# Patient Record
Sex: Female | Born: 1940 | Race: White | Hispanic: No | Smoking: Never smoker
Health system: Southern US, Community
[De-identification: ages and names within clinical notes are randomized; demographics above are authoritative.]

## PROBLEM LIST (undated history)

## (undated) DIAGNOSIS — K219 Gastro-esophageal reflux disease without esophagitis: Secondary | ICD-10-CM

## (undated) DIAGNOSIS — H409 Unspecified glaucoma: Secondary | ICD-10-CM

## (undated) DIAGNOSIS — M542 Cervicalgia: Secondary | ICD-10-CM

## (undated) DIAGNOSIS — R112 Nausea with vomiting, unspecified: Secondary | ICD-10-CM

## (undated) DIAGNOSIS — G43909 Migraine, unspecified, not intractable, without status migrainosus: Secondary | ICD-10-CM

## (undated) DIAGNOSIS — M549 Dorsalgia, unspecified: Secondary | ICD-10-CM

## (undated) DIAGNOSIS — M719 Bursopathy, unspecified: Secondary | ICD-10-CM

## (undated) DIAGNOSIS — T7840XA Allergy, unspecified, initial encounter: Secondary | ICD-10-CM

## (undated) DIAGNOSIS — J302 Other seasonal allergic rhinitis: Secondary | ICD-10-CM

## (undated) DIAGNOSIS — E756 Lipid storage disorder, unspecified: Secondary | ICD-10-CM

## (undated) DIAGNOSIS — R0600 Dyspnea, unspecified: Secondary | ICD-10-CM

## (undated) DIAGNOSIS — M1990 Unspecified osteoarthritis, unspecified site: Secondary | ICD-10-CM

## (undated) DIAGNOSIS — IMO0002 Reserved for concepts with insufficient information to code with codable children: Secondary | ICD-10-CM

## (undated) DIAGNOSIS — H269 Unspecified cataract: Secondary | ICD-10-CM

## (undated) DIAGNOSIS — E8809 Other disorders of plasma-protein metabolism, not elsewhere classified: Secondary | ICD-10-CM

## (undated) DIAGNOSIS — Z9889 Other specified postprocedural states: Secondary | ICD-10-CM

## (undated) DIAGNOSIS — IMO0001 Reserved for inherently not codable concepts without codable children: Secondary | ICD-10-CM

## (undated) DIAGNOSIS — I639 Cerebral infarction, unspecified: Secondary | ICD-10-CM

## (undated) DIAGNOSIS — I10 Essential (primary) hypertension: Secondary | ICD-10-CM

## (undated) HISTORY — PX: ABDOMINAL HYSTERECTOMY: SHX81

## (undated) HISTORY — PX: EYE SURGERY: SHX253

## (undated) HISTORY — DX: Unspecified cataract: H26.9

## (undated) HISTORY — PX: FOOT SURGERY: SHX648

## (undated) HISTORY — DX: Reserved for concepts with insufficient information to code with codable children: IMO0002

## (undated) HISTORY — PX: CARPAL TUNNEL RELEASE: SHX101

## (undated) HISTORY — DX: Bursopathy, unspecified: M71.9

## (undated) HISTORY — DX: Dorsalgia, unspecified: M54.9

## (undated) HISTORY — DX: Reserved for inherently not codable concepts without codable children: IMO0001

## (undated) HISTORY — DX: Migraine, unspecified, not intractable, without status migrainosus: G43.909

## (undated) HISTORY — DX: Cervicalgia: M54.2

## (undated) HISTORY — DX: Cerebral infarction, unspecified: I63.9

## (undated) HISTORY — PX: CERVICAL FUSION: SHX112

## (undated) HISTORY — DX: Allergy, unspecified, initial encounter: T78.40XA

## (undated) HISTORY — PX: SPINE SURGERY: SHX786

## (undated) HISTORY — DX: Essential (primary) hypertension: I10

## (undated) HISTORY — DX: Gastro-esophageal reflux disease without esophagitis: K21.9

---

## 2000-08-10 ENCOUNTER — Other Ambulatory Visit: Admission: RE | Admit: 2000-08-10 | Discharge: 2000-08-10 | Payer: Self-pay | Admitting: Internal Medicine

## 2001-01-11 ENCOUNTER — Encounter: Payer: Self-pay | Admitting: Family Medicine

## 2001-01-11 ENCOUNTER — Ambulatory Visit (HOSPITAL_COMMUNITY): Admission: RE | Admit: 2001-01-11 | Discharge: 2001-01-11 | Payer: Self-pay | Admitting: Family Medicine

## 2001-08-17 ENCOUNTER — Encounter: Payer: Self-pay | Admitting: Family Medicine

## 2001-08-17 ENCOUNTER — Ambulatory Visit (HOSPITAL_COMMUNITY): Admission: RE | Admit: 2001-08-17 | Discharge: 2001-08-17 | Payer: Self-pay | Admitting: Family Medicine

## 2002-03-08 ENCOUNTER — Ambulatory Visit (HOSPITAL_COMMUNITY): Admission: RE | Admit: 2002-03-08 | Discharge: 2002-03-08 | Payer: Self-pay | Admitting: Unknown Physician Specialty

## 2002-03-08 ENCOUNTER — Encounter: Payer: Self-pay | Admitting: Unknown Physician Specialty

## 2002-04-21 ENCOUNTER — Encounter: Payer: Self-pay | Admitting: Neurosurgery

## 2002-04-25 ENCOUNTER — Inpatient Hospital Stay (HOSPITAL_COMMUNITY): Admission: RE | Admit: 2002-04-25 | Discharge: 2002-04-27 | Payer: Self-pay | Admitting: Neurosurgery

## 2002-04-25 ENCOUNTER — Encounter: Payer: Self-pay | Admitting: Neurosurgery

## 2002-09-21 ENCOUNTER — Encounter (HOSPITAL_COMMUNITY): Admission: RE | Admit: 2002-09-21 | Discharge: 2002-10-21 | Payer: Self-pay | Admitting: Neurosurgery

## 2002-10-02 ENCOUNTER — Ambulatory Visit: Admission: RE | Admit: 2002-10-02 | Discharge: 2002-10-02 | Payer: Self-pay | Admitting: Orthopedic Surgery

## 2002-10-02 ENCOUNTER — Encounter: Payer: Self-pay | Admitting: Orthopedic Surgery

## 2002-10-05 ENCOUNTER — Encounter: Payer: Self-pay | Admitting: Orthopedic Surgery

## 2002-10-05 ENCOUNTER — Ambulatory Visit (HOSPITAL_COMMUNITY): Admission: RE | Admit: 2002-10-05 | Discharge: 2002-10-05 | Payer: Self-pay | Admitting: Orthopedic Surgery

## 2002-10-24 ENCOUNTER — Encounter (HOSPITAL_COMMUNITY): Admission: RE | Admit: 2002-10-24 | Discharge: 2002-11-23 | Payer: Self-pay | Admitting: Neurosurgery

## 2003-02-12 ENCOUNTER — Ambulatory Visit (HOSPITAL_COMMUNITY): Admission: RE | Admit: 2003-02-12 | Discharge: 2003-02-12 | Payer: Self-pay | Admitting: Neurosurgery

## 2004-09-02 ENCOUNTER — Ambulatory Visit (HOSPITAL_COMMUNITY): Admission: RE | Admit: 2004-09-02 | Discharge: 2004-09-02 | Payer: Self-pay | Admitting: Family Medicine

## 2004-09-15 ENCOUNTER — Ambulatory Visit: Payer: Self-pay | Admitting: Orthopedic Surgery

## 2004-09-16 ENCOUNTER — Encounter (HOSPITAL_COMMUNITY): Admission: RE | Admit: 2004-09-16 | Discharge: 2004-10-16 | Payer: Self-pay | Admitting: Orthopedic Surgery

## 2004-10-13 ENCOUNTER — Ambulatory Visit: Payer: Self-pay | Admitting: Orthopedic Surgery

## 2004-10-21 ENCOUNTER — Encounter (HOSPITAL_COMMUNITY): Admission: RE | Admit: 2004-10-21 | Discharge: 2004-11-20 | Payer: Self-pay | Admitting: Orthopedic Surgery

## 2004-11-03 ENCOUNTER — Encounter: Admission: RE | Admit: 2004-11-03 | Discharge: 2004-11-03 | Payer: Self-pay | Admitting: Orthopedic Surgery

## 2004-11-20 ENCOUNTER — Encounter: Admission: RE | Admit: 2004-11-20 | Discharge: 2004-11-20 | Payer: Self-pay | Admitting: Orthopedic Surgery

## 2004-12-11 ENCOUNTER — Encounter: Admission: RE | Admit: 2004-12-11 | Discharge: 2004-12-11 | Payer: Self-pay | Admitting: Orthopedic Surgery

## 2005-01-08 ENCOUNTER — Ambulatory Visit: Payer: Self-pay | Admitting: Orthopedic Surgery

## 2005-09-21 ENCOUNTER — Ambulatory Visit: Payer: Self-pay | Admitting: Orthopedic Surgery

## 2005-10-06 ENCOUNTER — Encounter: Admission: RE | Admit: 2005-10-06 | Discharge: 2005-10-06 | Payer: Self-pay | Admitting: Orthopedic Surgery

## 2005-10-29 ENCOUNTER — Encounter: Admission: RE | Admit: 2005-10-29 | Discharge: 2005-10-29 | Payer: Self-pay | Admitting: Orthopedic Surgery

## 2006-01-27 ENCOUNTER — Encounter: Admission: RE | Admit: 2006-01-27 | Discharge: 2006-01-27 | Payer: Self-pay | Admitting: Orthopedic Surgery

## 2006-04-19 ENCOUNTER — Ambulatory Visit: Payer: Self-pay | Admitting: Orthopedic Surgery

## 2006-04-30 ENCOUNTER — Encounter: Admission: RE | Admit: 2006-04-30 | Discharge: 2006-04-30 | Payer: Self-pay | Admitting: Orthopedic Surgery

## 2006-07-08 ENCOUNTER — Ambulatory Visit (HOSPITAL_COMMUNITY): Admission: RE | Admit: 2006-07-08 | Discharge: 2006-07-08 | Payer: Self-pay | Admitting: Neurosurgery

## 2006-09-22 ENCOUNTER — Encounter: Admission: RE | Admit: 2006-09-22 | Discharge: 2006-09-22 | Payer: Self-pay | Admitting: Orthopedic Surgery

## 2006-12-23 ENCOUNTER — Encounter: Admission: RE | Admit: 2006-12-23 | Discharge: 2006-12-23 | Payer: Self-pay | Admitting: Neurosurgery

## 2006-12-27 ENCOUNTER — Ambulatory Visit (HOSPITAL_COMMUNITY): Admission: RE | Admit: 2006-12-27 | Discharge: 2006-12-27 | Payer: Self-pay | Admitting: Family Medicine

## 2007-05-11 ENCOUNTER — Ambulatory Visit (HOSPITAL_COMMUNITY): Admission: RE | Admit: 2007-05-11 | Discharge: 2007-05-11 | Payer: Self-pay | Admitting: Family Medicine

## 2007-07-28 ENCOUNTER — Encounter: Admission: RE | Admit: 2007-07-28 | Discharge: 2007-07-28 | Payer: Self-pay | Admitting: Neurosurgery

## 2007-08-16 ENCOUNTER — Encounter: Admission: RE | Admit: 2007-08-16 | Discharge: 2007-08-16 | Payer: Self-pay | Admitting: Neurosurgery

## 2007-10-06 ENCOUNTER — Encounter (HOSPITAL_COMMUNITY): Admission: RE | Admit: 2007-10-06 | Discharge: 2007-11-05 | Payer: Self-pay | Admitting: Neurosurgery

## 2007-11-08 ENCOUNTER — Encounter (HOSPITAL_COMMUNITY): Admission: RE | Admit: 2007-11-08 | Discharge: 2007-12-08 | Payer: Self-pay | Admitting: Neurosurgery

## 2007-12-19 ENCOUNTER — Ambulatory Visit: Payer: Self-pay | Admitting: Internal Medicine

## 2008-03-05 ENCOUNTER — Ambulatory Visit (HOSPITAL_COMMUNITY): Admission: RE | Admit: 2008-03-05 | Discharge: 2008-03-05 | Payer: Self-pay | Admitting: Orthopedic Surgery

## 2008-03-05 ENCOUNTER — Ambulatory Visit: Payer: Self-pay | Admitting: Orthopedic Surgery

## 2008-03-05 DIAGNOSIS — M19049 Primary osteoarthritis, unspecified hand: Secondary | ICD-10-CM | POA: Insufficient documentation

## 2008-03-05 DIAGNOSIS — M76899 Other specified enthesopathies of unspecified lower limb, excluding foot: Secondary | ICD-10-CM | POA: Insufficient documentation

## 2008-06-28 ENCOUNTER — Ambulatory Visit (HOSPITAL_COMMUNITY): Admission: RE | Admit: 2008-06-28 | Discharge: 2008-06-28 | Payer: Self-pay | Admitting: Family Medicine

## 2009-06-27 ENCOUNTER — Ambulatory Visit (HOSPITAL_COMMUNITY): Admission: RE | Admit: 2009-06-27 | Discharge: 2009-06-27 | Payer: Self-pay | Admitting: Family Medicine

## 2009-06-28 ENCOUNTER — Ambulatory Visit (HOSPITAL_COMMUNITY): Admission: RE | Admit: 2009-06-28 | Discharge: 2009-06-28 | Payer: Self-pay | Admitting: Family Medicine

## 2010-02-06 ENCOUNTER — Ambulatory Visit: Payer: Self-pay | Admitting: Orthopedic Surgery

## 2010-02-06 DIAGNOSIS — M5126 Other intervertebral disc displacement, lumbar region: Secondary | ICD-10-CM | POA: Insufficient documentation

## 2010-02-06 DIAGNOSIS — M4800 Spinal stenosis, site unspecified: Secondary | ICD-10-CM | POA: Insufficient documentation

## 2010-02-06 DIAGNOSIS — Q762 Congenital spondylolisthesis: Secondary | ICD-10-CM | POA: Insufficient documentation

## 2010-02-07 ENCOUNTER — Encounter: Payer: Self-pay | Admitting: Orthopedic Surgery

## 2010-02-11 ENCOUNTER — Ambulatory Visit (HOSPITAL_COMMUNITY): Admission: RE | Admit: 2010-02-11 | Discharge: 2010-02-11 | Payer: Self-pay | Admitting: Orthopedic Surgery

## 2010-02-12 ENCOUNTER — Encounter: Payer: Self-pay | Admitting: Orthopedic Surgery

## 2010-02-17 ENCOUNTER — Telehealth: Payer: Self-pay | Admitting: Orthopedic Surgery

## 2010-02-18 ENCOUNTER — Encounter (HOSPITAL_COMMUNITY)
Admission: RE | Admit: 2010-02-18 | Discharge: 2010-03-20 | Payer: Self-pay | Source: Home / Self Care | Attending: Orthopedic Surgery | Admitting: Orthopedic Surgery

## 2010-02-26 ENCOUNTER — Encounter: Payer: Self-pay | Admitting: Orthopedic Surgery

## 2010-03-20 ENCOUNTER — Ambulatory Visit (HOSPITAL_COMMUNITY)
Admission: RE | Admit: 2010-03-20 | Discharge: 2010-03-20 | Payer: Self-pay | Source: Home / Self Care | Attending: Family Medicine | Admitting: Family Medicine

## 2010-03-27 ENCOUNTER — Encounter: Payer: Self-pay | Admitting: Orthopedic Surgery

## 2010-04-13 ENCOUNTER — Encounter: Payer: Self-pay | Admitting: Orthopedic Surgery

## 2010-04-24 NOTE — Assessment & Plan Note (Signed)
Summary: RT KNEE PAIN NEEDS XR/BLUE MCR/BSF   Visit Type:  Follow-up  CC:  right knee pain.  History of Present Illness: Kerry Richardson presents with a new problem complaint of RIGHT knee pain.  In the past. She was seen for bursitis and received an injection and was treated with Voltaren gel successfully.  She was also worked up for lumbar disc problem and was treated with epidural steroid injections in 2008. She had an MRI at that time.  She is now complaining of pain in her RIGHT thigh, which radiates into her shin, associated with numbness and tingling of the leg. No back pain.  Treatment so far 800 mg ibuprofen as needed with some relief. She also takes vitamin D 3. X-rays of the RIGHT knee were done in December of 2009 and they were absolutely normal  DR. Roy follows her for back pain; saw him last month/ sees him every 3 months      Allergies: 1)  ! Codeine  Past History:  Past Medical History: Last updated: 03/02/2008 migraines skin cancer in past seasonal allergies  Past Surgical History: Last updated: 03/02/2008 hysterectomy bilateral CTR cervical fusion 2004 harrison excision mass rt long finger  Review of Systems Neurologic:  Complains of numbness and tingling. Musculoskeletal:  Complains of joint pain and muscle pain; denies swelling, instability, and stiffness.  Physical Exam  Skin:  intact without lesions or rashes Inguinal Nodes:  no significant adenopathy Psych:  alert and cooperative; normal mood and affect; normal attention span and concentration   Detailed Back/Spine Exam  General:    Well-developed, well-nourished, in no acute distress; alert and oriented x 3.    Gait:    Normal heel-toe gait pattern bilaterally.    Skin:    Intact with no erythema; no scarring.    Vascular:    dorsalis pedis and posterior tibial pulses 2+ and symmetric, capillary refill < 2 seconds, normal hair pattern, no evidence of ischemia.   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 Patient Level IV (78469) Lumbosacral Spine ,2/3 views (72100)  Problem # 3:  SPINAL STENOSIS (ICD-724.00) Assessment: Deteriorated  Orders: Est. Patient Level IV (62952) Lumbosacral Spine ,2/3 views (72100)  we have an MRI from April of 2008 and at that time. She had spondylosis bulging disc, asymmetric L4, L5, central and LEFT lateral recess stenosis, possible LEFT L5 root impingement, as well as advanced facet disease at L5, S1 with a grade 1 anterolisthesis mild bilateral  foraminal stenosis at other levels of bulging disc disease.  She also had a RIGHT knee x-ray which showed no arthritis.  Patient Instructions: 1)  MRI l spine  2)  spinal stenosis and spondylolisthesis  3)  I'll forward the report to Dr Channing Mutters and let him know we think the symptoms are from your back    Orders Added: 1)  Est. Patient Level IV [16109] 2)  Lumbosacral Spine ,2/3 views [72100]

## 2010-04-24 NOTE — Miscellaneous (Signed)
Summary: Dr. Channing Mutters Consult already a pt of his  Clinical Lists Changes  Orders: Added new Referral order of Neurosurgeon Referral (Neurosurgeon) - Signed Added new Referral order of Physical Therapy Referral (PT) - Signed

## 2010-04-24 NOTE — Progress Notes (Signed)
Summary: Referral to Dr. Channing Mutters.  Phone Note Outgoing Call   Call placed by: Waldon Reining,  February 17, 2010 1:13 PM Call placed to: Specialist Action Taken: Information Sent Summary of Call: I faxed a referral for this patient to Dr. Channing Mutters for back pain. She is already a patient there.

## 2010-04-24 NOTE — Miscellaneous (Signed)
Summary: Clinical Eval PT  Clinical Eval PT   Imported By: Eugenio Hoes 02/26/2010 15:41:36  _____________________________________________________________________  External Attachment:    Type:   Image     Comment:   External Document

## 2010-04-24 NOTE — Miscellaneous (Signed)
Summary: mri tues 02/11/10 reg 1230 [facility] DR TO CALL WITH RESULTS  Clinical Lists Changes   no precert needed for medicare/bcbs, Dr H to call with results, pt aware of appt, MRI L SPINE   Forward copy of MRI to Dr. Channing Mutters Neurosurgeon.

## 2010-04-24 NOTE — Miscellaneous (Signed)
Summary: PT progress note  PT progress note   Imported By: Jacklynn Ganong 03/27/2010 16:25:34  _____________________________________________________________________  External Attachment:    Type:   Image     Comment:   External Document

## 2010-04-24 NOTE — Miscellaneous (Signed)
  relayed results   plan   PT and dr Channing Mutters consult  Clinical Lists Changes

## 2010-08-08 NOTE — H&P (Signed)
NAME:  Kerry Richardson, Kerry Richardson                            ACCOUNT NO.:  [id number]   MEDICAL RECORD NO.:  [id number]                   PATIENT TYPE:  REC   LOCATION:  [facility]                                  FACILITY:  [facility]   PHYSICIAN:  Vickki Hearing, M.D.           DATE OF BIRTH:  06-23-1940   DATE OF ADMISSION:  09/21/2002  DATE OF DISCHARGE:                                HISTORY & PHYSICAL   CHIEF COMPLAINT:  Mass, right long finger.   HISTORY OF PRESENT ILLNESS:  This is a 70 year old right hand dominant  female recently status post surgical fusion with some residual discomfort in  the left upper extremity and hoarseness who presents with a mass over the  proximal phalanx of the right long/middle finger.  This is painful.  This is  interfering with her activities of daily living.  She has tried to live with  it, but it is becoming increasingly more difficult.  If she touches or hits  anything with the right hand and the mass is touched, this causes pain.   REVIEW OF SYSTEMS:  1. History of migraines.  2. History of skin cancer.  3. Hoarseness.  4. Seasonal allergies.   PAST SURGICAL HISTORY:  1. Hysterectomy.  2. Bilateral carpal tunnel release  3. Surgical fusion.   MEDICATIONS:  1. Neurontin.  2. Lodine.  3. Imitrex.   FAMILY HISTORY:  No heart disease, lung disease, asthma, kidney disease,  arthritis or cancer.   SOCIAL HISTORY:  She is married.  Does not smoke or use alcohol, and she  completed her Associates Degree in College.   PHYSICAL EXAMINATION:  GENERAL APPEARANCE:  I have before me a healthy, well-  developed, well-nourished female with normal grooming and hygiene.  Nutritional status normal.  No major deformity.  VASCULAR:  Pulses and perfusion of the finger were normal.  Color and  temperature were normal.  No venous distention was noted.  No evidence of  hemangioma.  VITAL SIGNS:  Stable.  SKIN:  Warm, dry and intact in all four extremities.  There was  a mass over  the right long finger proximal phalanx.  It was tender.  It was firm.  It  did not move with the tendon.  LYMPH NODES:  Negative.  NEUROPSYCHIC:  Sensation normal in all four extremities.  Mood and affect  normal.  She was awake, alert and oriented x3.  MUSCULOSKELETAL:  Normal gait.  The mass was less than 5 mm in length.  Range of motion of the finger normal.  No triggering was noted.  No palmer  tenderness was noted.  Motor exam was normal.  Finger was stable.   MEDICAL DECISION MAKING:  Three views were reviewed in my office.  X-rays  showed no evidence of fracture or dislocation.  No evidence of bone tumor.   DIFFERENTIAL DIAGNOSIS:  Ganglion cyst of tendon sheath, subcutaneous  nodule.  ASSESSMENT:  The patient has requested to have this mass removed due to the  difficulty she is having with activities of daily living.  The risks and  benefits of the procedure were discussed and accepted.   PLAN:  Excision of mass, right long finger.  Date of surgery schedule for  July 15.  Postop visit scheduled for July 19.  Sutures to be taken out October 15, 2002, or thereabouts.                                               Vickki Hearing, M.D.    SEH/MEDQ  D:  10/02/2002  T:  10/02/2002  Job:  [id number]   cc:   Vickki Hearing, M.D.  Fax: [phone]

## 2010-08-08 NOTE — H&P (Signed)
NAME:  Kerry Richardson, Kerry Richardson                            ACCOUNT NO.:  [id number]   MEDICAL RECORD NO.:  [id number]                   PATIENT TYPE:  INP   LOCATION:  2871                                 FACILITY:  [facility]   PHYSICIAN:  Payton Doughty, M.D.                   DATE OF BIRTH:  [birth year]   DATE OF ADMISSION:  04/26/2002  DATE OF DISCHARGE:                                HISTORY & PHYSICAL   ADMISSION DIAGNOSIS:  Cervical spondylosis without myelopathy.   HISTORY OF PRESENT ILLNESS:  A very nice 70 year old right-handed white lady  who in December had the onset of left shoulder pain radiating to scapula.  It has progressed down her left arm with some dysesthesias.  There is no  frank numbness of hand.  MRI shows cervical spondylitic disease at multiple  levels, and she was referred to me.   PAST MEDICAL HISTORY:  Benign.   MEDICATIONS:  None.   ALLERGIES:  She gets nauseated with CODEINE but does not have any allergies.   PAST SURGICAL HISTORY:  1. Hysterectomy.  2. Carpal tunnel bilaterally.  3. Morton's neuromas.   SOCIAL HISTORY:  She does not smoke and does not drink.  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   FAMILY HISTORY:  Mother is 71 and in good health with hypercholesterolemia.  Dad is deceased with Alzheimer's.   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   PHYSICAL EXAMINATION:  HEENT:  Exam within normal limits.  NECK:  Good range of motion and cannot reproduce arm pain with it and sore  all the time.  CHEST:  Clear.  CARDIAC:  Regular rate and rhythm.  ABDOMEN:  Nontender.  No hepatosplenomegaly.  EXTREMITIES:  Without clubbing or cyanosis.  GU:  Exam deferred.  PULSES:  Peripheral pulses good.  NEUROLOGIC:  Awake, alert, and oriented.  Cranial nerves are intact.  Motor  exam demonstrates 5/5 strength throughout the upper and lower extremities  save for the left  biceps which is 5-/5.  There are no sensory deficits  described.  Biceps reflexes absent.  The others are 2.  Lower extremities  are not myopathic.  Hoffmann's is negative.   LABORATORY DATA:  MRI demonstrates spondylitic disease at numerous levels,  C4-5, C5-6, and C6-7.  The most affected is C5-6 being the worst.  At all  three levels,the canal gets down about 7 or 8 mm.  There is trace CSF but no  abnormal signals down the cord.   CLINICAL IMPRESSION:  Cervical spondylosis without myelopathy.   PLAN:  Anterior cervical diskectomy and fusion at C4-5, C5-6, and C6-7.  The  risks and benefits of this approach have been discussed fully, and she  wishes to proceed.  Payton Doughty, M.D.    MWR/MEDQ  D:  04/25/2002  T:  04/25/2002  Job:  [id number]

## 2010-08-08 NOTE — Op Note (Signed)
   NAME:  [name], [name]                            ACCOUNT NO.:  [id number]   MEDICAL RECORD NO.:  [id number]                   PATIENT TYPE:  AMB   LOCATION:  DAY                                  FACILITY:  [facility]   PHYSICIAN:  Vickki Hearing, M.D.           DATE OF BIRTH:  1941-03-12   DATE OF PROCEDURE:  DATE OF DISCHARGE:  10/05/2002                                 OPERATIVE REPORT   PREOPERATIVE DIAGNOSIS:  Mass right long finger.   POSTOPERATIVE DIAGNOSIS:  Ganglion cyst of tendon sheath.   PROCEDURE:   SURGEON:  Vickki Hearing, M.D.   ANESTHESIA:  Regional block.   FINDINGS:  Ganglion cyst with nodularity of the tendon.   HISTORY:  This patient is 70 years old and presented with a mass of the  right long finger which was tender and painful. She wished to have it  removed.   DETAILS OF PROCEDURE:  [name] was identified as Kerry Richardson.  Her right  long finger was marked at the surgical site.  She was given a gram of Ancef  and taken to the operating room for regional anesthetic.  At that time a  time out was taken to confirm the operative procedure and site and review  the medical record.  Everything coincided and the patient was prepped and  draped in the usual sterile technique.   An incision was made over the mass.  The subcutaneous tissue was bluntly  dissected around the mass.  The mass was excised sharply.  The tendon was  observed to have some nodularity and discoloration.  The wound was irrigated  and closed with 3-0 nylon sutures.  Sterile dressing was applied.  Tourniquet was released.  The patient was taken to the recovery room in  stable condition and scheduled for follow up within a week.                                               Vickki Hearing, M.D.    SEH/MEDQ  D:  10/12/2002  T:  10/12/2002  Job:  [id number]

## 2010-08-08 NOTE — Op Note (Signed)
NAME:  Kerry Richardson, Kerry Richardson                            ACCOUNT NO.:  [id number]   MEDICAL RECORD NO.:  [id number]                   PATIENT TYPE:  INP   LOCATION:  3172                                 FACILITY:  [facility]   PHYSICIAN:  Payton Doughty, M.D.                   DATE OF BIRTH:  [birth year]   DATE OF PROCEDURE:  DATE OF DISCHARGE:                                 OPERATIVE REPORT   PREOPERATIVE DIAGNOSIS:  Cervical spondylosis, C4-5, C5-6 and C6-7.   POSTOPERATIVE DIAGNOSIS:  Cervical spondylosis, C4-5, C5-6 and C6-7.   OPERATIVE PROCEDURE:  C4-5, C5-6, and C6-7 anterior cervical decompression  and fusion with a tether plate.   SURGEON:  Dr. Trey Sailors.   ANESTHESIA:  General endotracheal.   Prepped sterilely with 5% alcohol wipe.   COMPLICATIONS:  None.   This 70 year old, right-handed, white lady with severe cervical spondylosis  and left arm pain.  She is taken to the operating room, anesthesia induced  and intubated.  Placed supine on the operating table.  She was placed in the  halter head traction.  Following shave, prepped and draped in the usual  sterile fashion, skin was incised in the midline.  Identified  sternocleidomastoid muscle on the left side at the level of the carotid  tubercle.  The platysma was identified, elevated, divided and undermined.  Sternocleidomastoid was identified and resection revealed the carotid  artery, retracted laterally to the left.  Trachea and esophagus were  retracted laterally to the right, exposing the bones of the anterior  cervical spine.  Intraoperative x-rays were obtained and confirmed  correctness of level.  After having confirmed correctness of level,  diskectomy was carried out at C4-5, C5-6 and C6-7 under gross observation.  The operating microscope was then brought in and microdissection technique  used to dissect the anterior epidural space, removing the posterior  longitudinal ligament and decompressing the neuro foramina  bilaterally.  At  C4-5, there was extensive degenerative disk disease and the herniated disk  eccentric to the left side with elevation of the left C5 root.  At 5-6,  there was spondylotic disease with mostly posteriorly protruding osteophyte  at C6-7 and also had degenerative disk disease with slightly less posterior  protrusion of the osteophyte.  Following complete decompression, the 7 mm  bone grafts were fashioned with patellar allograft and tapped into place.  Tethered plate was then placed with two screws in C4-1 and C5-1, and C6 and  C7.  Intraoperative x-rays showed good placement of bone grafts, plate and  screws.  The wound was irrigated, hemostasis assured.  The platysma was  reapproximated with 3-0 Vicryl in interrupted fashion.  Subcutaneous tissues  were reapproximated with 3-0 Vicryl in interrupted fashion and the skin was  closed with 4-0 Vicryl in the running subcuticular fashion.  Benzoin and  Steri-Strips were placed, Telfa at the operative  site.  The patient returned  to the recovery room in good condition.                                               Payton Doughty, M.D.    MWR/MEDQ  D:  04/25/2002  T:  04/25/2002  Job:  [id number]

## 2011-10-27 ENCOUNTER — Ambulatory Visit (HOSPITAL_COMMUNITY)
Admission: RE | Admit: 2011-10-27 | Discharge: 2011-10-27 | Disposition: A | Discharge status: Home / Self Care | Payer: Medicare Other | Source: Ambulatory Visit | Attending: Family Medicine | Admitting: Family Medicine

## 2011-10-27 ENCOUNTER — Other Ambulatory Visit: Payer: Self-pay | Admitting: Family Medicine

## 2011-10-27 DIAGNOSIS — M25562 Pain in left knee: Secondary | ICD-10-CM

## 2011-10-27 DIAGNOSIS — M25569 Pain in unspecified knee: Secondary | ICD-10-CM | POA: Insufficient documentation

## 2011-11-11 ENCOUNTER — Telehealth: Payer: Self-pay | Admitting: Orthopedic Surgery

## 2011-11-11 ENCOUNTER — Encounter: Payer: Self-pay | Admitting: Orthopedic Surgery

## 2011-11-11 ENCOUNTER — Ambulatory Visit (INDEPENDENT_AMBULATORY_CARE_PROVIDER_SITE_OTHER): Payer: Medicare Other | Admitting: Orthopedic Surgery

## 2011-11-11 VITALS — BP 140/64 | Ht 61.0 in | Wt 189.0 lb

## 2011-11-11 DIAGNOSIS — M25569 Pain in unspecified knee: Secondary | ICD-10-CM

## 2011-11-11 DIAGNOSIS — M23329 Other meniscus derangements, posterior horn of medial meniscus, unspecified knee: Secondary | ICD-10-CM

## 2011-11-11 NOTE — Telephone Encounter (Signed)
no

## 2011-11-11 NOTE — Progress Notes (Signed)
Subjective:     Patient ID: Kerry Richardson, female   DOB: 07-16-1940, 71 y.o.   MRN: [id number]  HPI Comments: The patient's pain started in the back of the knee and was present for a couple of weeks and then she got up stood on her leg and had acute pain on the medial side of the knee which was severe and has worsened despite being on Voltaren 75 mg twice a day and prior to that being on ibuprofen over-the-counter. The knee feels like it would give way. When she stood on the knee it felt as if something for her.  Knee Pain  The incident occurred more than 1 week ago (2 months ago). The incident occurred at home. There was no injury mechanism. The pain is present in the right knee (Medial side). The quality of the pain is described as aching. The pain is moderate. The pain has been worsening since onset. Associated symptoms include an inability to bear weight and a loss of motion. Pertinent negatives include no loss of sensation, muscle weakness, numbness or tingling.     Review of Systems  Neurological: Negative for tingling and numbness.   positive findings weight gain, watering of the eyes, cough, snoring, numbness. There negative findings chest pain heartburn frequency joint pain other than stated skin changes nervousness bleeding thirst seasonal allergy   Past Medical History  Diagnosis Date  . Bulging discs   . Bursitis     Past Surgical History  Procedure Date  . Cervical fusion   . Abdominal hysterectomy   . Foot surgery   . Carpal tunnel release     bilateral    History  Substance Use Topics  . Smoking status: Never Smoker   . Smokeless tobacco: Not on file  . Alcohol Use: No      Objective:   Physical Exam  Constitutional: She is oriented to person, place, and time. She appears well-developed and well-nourished.  Neck: Normal range of motion.  Cardiovascular: Normal rate and intact distal pulses.   Pulmonary/Chest: Effort normal. No respiratory distress.  Abdominal:  She exhibits no distension.  Musculoskeletal:       Right shoulder: Normal.       Left shoulder: Normal.       Right hip: Normal. She exhibits normal range of motion, normal strength, no tenderness, no swelling, no crepitus and no deformity.       Left hip: Normal. She exhibits normal range of motion, normal strength, no tenderness, no bony tenderness, no swelling, no crepitus and no deformity.       Right knee: Normal. She exhibits normal range of motion, no effusion, normal alignment, no LCL laxity and no MCL laxity. no tenderness found.       Left knee: She exhibits swelling, effusion, abnormal patellar mobility and abnormal meniscus. She exhibits normal range of motion, no ecchymosis, no deformity, no laceration, no erythema, normal alignment, no LCL laxity, no bony tenderness and no MCL laxity. tenderness found. Medial joint line tenderness noted. No lateral joint line, no MCL, no LCL and no patellar tendon tenderness noted.  Lymphadenopathy:    She has no cervical adenopathy.  Neurological: She is alert and oriented to person, place, and time. She has normal reflexes.  Skin: Skin is warm and dry.  Psychiatric: She has a normal mood and affect. Her behavior is normal. Judgment and thought content normal.    imaging we see that she does have some degenerative arthritis symmetric small effusion  moderate amount of narrowing     Assessment:      medial meniscal tear Osteoarthritis Effusion    Plan:     Injection, bracing, continue anti-inflammatories if no improvement after 3 weeks recommend arthroscopy. We will discuss MRI prior to arthroscopy on the next patient's     Knee  Injection Procedure Note  Pre-operative Diagnosis: left knee oa  Post-operative Diagnosis: same  Indications: pain  Anesthesia: ethyl chloride   Procedure Details   Verbal consent was obtained for the procedure. Time out was completed.The joint was prepped with alcohol, followed by  Ethyl chloride spray and  A 20 gauge needle was inserted into the knee via lateral approach; 4ml 1% lidocaine and 1 ml of depomedrol  was then injected into the joint . The needle was removed and the area cleansed and dressed.  Complications:  None; patient tolerated the procedure well.

## 2011-11-11 NOTE — Telephone Encounter (Signed)
Patient called to ask if she is to wear her knee brace when doing the home exercises?  Her ph# is [phone].

## 2011-11-11 NOTE — Telephone Encounter (Signed)
Called patient, left message per Dr. Mort Sawyers reply.

## 2011-11-11 NOTE — Patient Instructions (Addendum)
You have received a steroid shot. 15% of patients experience increased pain at the injection site with in the next 24 hours. This is best treated with ice and tylenol extra strength 2 tabs every 8 hours. If you are still having pain please call the office.   Wear brace continue Voltaren   Do knee exercises daily Surgery for Meniscus Tear with Phase I Rehab INDICATIONS - (WHO NEEDS SURGERY, WHEN, WHY, AND GOALS)    Surgery is advised for people with meniscus tears who experience locking, recurring swelling, and/or giving way of the knee.   Surgery is advised if non-surgical treatment has failed.   Sometimes, surgery is advised for people with pain or tenderness along the joint line.   Surgery is advised for people with displaced tears that prevent full knee range of motion ("locked knee"), or if there is a sign of a "bucket handle" tear (meniscus tears and flips to the center of the knee).   Surgery to repair a meniscus tear is elective (the patient chooses to have surgery). There is no evidence that the timing of surgery has an affect on the outcome of surgery.   Menisci have poor blood supply and little ability to heal. For this reason, less than 20% of all meniscus tears are repairable by sewing (suturing) them together. The rest are treated by removal of all or part of the meniscus (meniscectomy).   If a meniscectomy is performed, the meniscus does not reform.   If the meniscus tear occurs without an anterior cruciate ligament (ACL) tear, the success rate of surgery is approximately 80%. However, if an ACL tear is present, the success rate is about 40%. If the meniscus tear is repairable, most surgeons also recommend reconstructing the ACL.   One function of the meniscus is to reduce forces in the knee. Because of this, the loss of meniscus cartilage is linked with the early development of arthritis of the knee joint. The goal of meniscus surgery is to remove as little of the meniscus as  possible.   Removing all or part of a torn meniscus allows for shaping of the cartilage and removal of torn edges, which prevents:   Progression of the tear (making a tear larger).   Displacement of the tear, causing recurring symptoms of locking, giving way, and swelling.   If a meniscus tear does not cause problems, it may be left alone. However, torn meniscus cartilage does not function, and the development of arthritis or symptoms such as locking, swelling, and giving way may still occur. Further, tears may progress to become larger, if left untreated.  CONTRAINDICATIONS - (REASONS NOT TO OPERATE)    Infection of the knee.   Inability or unwillingness to complete a rehabilitation program.   Pain or symptoms not related to the meniscus.   Arthritis of the knee, which causes the (symptomatic) meniscus tear.  RISKS AND COMPLICATIONS  Infection.   Bleeding.   Injury to nerves (numbness, weakness, paralysis).   Recurring symptoms (giving way, locking, swelling), including tearing the remaining meniscus if removal of the affected meniscus (meniscectomy) is performed, and re-tear or nonhealing of the meniscus repair.   Knee stiffness (loss of knee motion).   Continued pain.   Weakness of the thigh (quadriceps) muscles.   Do not eat or drink anything before surgery. Solid food makes general anesthesia more hazardous.  PROCEDURE   Meniscus tear surgery is performed through an incision near the joint (arthroscopically), and you go home the same day  as surgery (outpatient basis). The procedure may be completed with general anesthesia, spinal anesthesia, or local anesthesia. The procedure involves using small power tools to remove the torn portion of the meniscus. If the tear is repairable, the edges of the tear are freshened. Then, sewing (sutures), anchors, or tacks are used to hold the torn edges together, while the meniscus heals.   AFTER THE PROCEDURE    Keep the wound clean and  dry after the surgery (usually two weeks).   Keep the foot and ankle elevated above heart level whenever possible, for the first 1 to 2 weeks after surgery.   You will be given pain medicines by your caregiver.   Ice the knee to reduce inflammation.   You may put as much weight on the operated leg as possible, although you will often be given crutches after surgery, until you can walk without a limp.   If the tear is repaired (sewn back together), you may be given a brace, and possibly be allowed to bear full weight on the operated leg while you are wearing the brace, for varying periods (depends on your caregiver).   After surgery, rehabilitation and exercises are important to regain motion, and then strength.  RETURN TO SPORTS    Return to sports depends on many factors including:   Type of procedure (meniscus repair or removal).   Type of sport.   Position played.   It may take 6 weeks before sports can be resumed after meniscus removal (although it may be as early as 1 to 2 weeks), or 6 to 9 months after a meniscus repair.   Full knee motion and strength are needed, before sports can be resumed.  SEEK MEDICAL CARE IF:    You experience pain, numbness, or coldness in the foot.   Any of the following signs of infection occur after surgery: fever, increased pain, swelling, redness, drainage of fluids, or bleeding in the affected area.   New, unexplained symptoms develop. (Drugs used in treatment may produce side effects.)  EXERCISES   PHASE I EXERCISES RANGE OF MOTION (ROM) AND STRETCHING EXERCISES- Meniscus Tear, Surgery For Phase I These are some of the initial exercises with which you may start your rehabilitation program, until you see your caregiver again or until your symptoms are resolved. Remember:    These initial exercises are intended to be gentle. They will help you restore motion without increasing any swelling.   Completing these exercises allows less painful  movement and prepares you for the more aggressive strengthening exercises in Phase II.   An effective stretch should be held for at least 30 seconds.   A stretch should never be painful. You should only feel a gentle lengthening or release in the stretched tissue.  RANGE OF MOTION - Knee Flexion, Active  Lie on your back with both knees straight. (If this causes back discomfort, bend your healthy knee, placing your foot flat on the floor.)   Slowly slide your heel back toward your buttocks until you feel a gentle stretch in the front of your knee or thigh.   Hold for __5________ seconds. Slowly slide your heel back to the starting position.  Repeat ____10______ times. Complete this exercise _____1_____ times per day.   RANGE OF MOTION - Knee Flexion and Extension, Active-Assisted  Sit on the edge of a table or chair with your thighs firmly supported. It may be helpful to place a folded towel under the end of your right / left  thigh.   Flexion (bending): Place the ankle of your healthy leg on top of the other ankle. Use your healthy leg to gently bend your right / left knee until you feel a mild tension across the top of your knee.   Hold for ___2_______ seconds.   Extension (straightening): Switch your ankles so your right / left leg is on top. Use your healthy leg to straighten your right / left knee until you feel a mild tension on the backside of your knee.   Hold for ___2_______ seconds.  Repeat ___10_______ times. Complete _____1_____ times per day. STRETCH - Knee Flexion, Supine  Lie on the floor with your right / left heel and foot lightly touching the wall. (Place both feet on the wall if you do not use a door frame.)   Without using any effort, allow gravity to slide your foot down the wall slowly until you feel a gentle stretch in the front of your right / left knee.   Hold this stretch for ___2_______ seconds. Then return the leg to the starting position, using your health  leg for help, if needed.  Repeat __10________ times. Complete this stretch ___1_______ times per day.   STRETCH - Knee Extension Sitting  Sit with your right / left leg/heel propped on another chair, coffee table, or foot stool.   Allow your leg muscles to relax, letting gravity straighten out your knee.*   You should feel a stretch behind your right / left knee. Hold this position for ______2____ seconds.  Repeat _____10_____ times. Complete this stretch ____1______ times per day.   STRENGTHENING EXERCISES Meniscus Tear, Surgery For Phase I These exercises may help you when beginning to rehabilitate your injury. They may resolve your symptoms with or without further involvement from your physician, physical therapist or athletic trainer. While completing these exercises, remember:    Muscles can gain both the endurance and the strength needed for everyday activities through controlled exercises.   Complete these exercises as instructed by your physician, physical therapist or athletic trainer. Increase the resistance and repetitions only as guided by your caregiver.  STRENGTH - Quadriceps, Isometrics  Lie on your back with your right / left leg extended and your opposite knee bent.   Gradually tense the muscles in the front of your right / left thigh. You should see either your knee cap slide up toward your hip or increased dimpling just above the knee. This motion will push the back of the knee down toward the floor, mat, or bed on which you are lying.   Hold the muscle as tight as you can, without increasing your pain, for ____2______ seconds.   Relax the muscles slowly and completely in between each repetition.  Repeat _____10_____ times. Complete this exercise ____1______ times per day.   STRENGTH - Quadriceps, Short Arcs   Lie on your back. Place a  towel roll under your right / left knee, so that the knee bends slightly.   Raise only your lower leg by tightening the muscles in  the front of your thigh. Do not allow your thigh to rise.   Hold this position for __5________ seconds.  Repeat ___10_______ times. Complete this exercise _____1_____ times per day.    STRENGTH - Quadriceps, Straight Leg Raises  Quality counts! Watch for signs that the quadriceps muscle is working, to be sure you are strengthening the correct muscles and not "cheating" by substituting with healthier muscles.  Lay on your back with your right /  left leg extended and your opposite knee bent.   Tense the muscles in the front of your right / left thigh. You should see either your knee cap slide up or increased dimpling just above the knee. Your thigh may even shake a bit.   Tighten these muscles even more and raise your leg 4 to 6 inches off the floor. Hold for ____5______ seconds.   Keeping these muscles tense, lower your leg.   Relax the muscles slowly and completely in between each repetition.  Repeat ___10_______ times. Complete this exercise _______1___ times per day.   STRENGTH - Hamstring, Curls   Lay on your stomach with your legs extended. (If you lay on a bed, your feet may hang over the edge.)   Tighten the muscles in the back of your thigh to bend your right / left knee up to 90 degrees. Keep your hips flat on the bed.   Hold this position for ____2______ seconds.   Slowly lower your leg back to the starting position.  Repeat ____10______ times. Complete this exercise _______1___ times per day.   STRENGTH - Quadriceps, Squats  Stand in a door frame so that your feet and knees are in line with the frame.   Use your hands for balance, not support, on the frame.   Slowly lower your weight, bending at the hips and knees. Keep your lower legs upright so that they are parallel with the door frame. Squat only within the range that does not increase your knee pain. Never let your hips drop below your knees.   Slowly return upright, pushing with your legs, not pulling with your  hands.  Repeat _____10_____ times. Complete this exercise __1________ times per day.   STRENGTH- Isometric Quad/VMO   Sit in a chair with your knee bent 75 to 90 degrees.   With your fingertips, feel the muscle just above the kneecap on the inside half of your thigh. This is the VMO.   Push your foot and leg into the floor to cause the thigh muscles to tighten.   Concentrate on feeling the VMO tighten. This muscle is important because it helps control the position of your kneecap.   Tighten and hold for __5________ seconds.   Repeat exercise ____10______ times, ____1_____ times per day.  STRENGTH - Quad/VMO, Isometric   Sit in a chair with your right / left knee slightly bent. With your fingertips, feel the VMO muscle just above the inside of your knee. The VMO is important in controlling the position of your kneecap.   Keeping your fingertips on this muscle. Without actually moving your leg, attempt to drive your knee down as if straightening your leg. You should feel your VMO tense. If you have a difficult time, you may wish to try the same exercise on your healthy knee first.   Tense this muscle as hard as you can without increasing any knee pain.   Hold for ____5______ seconds. Relax the muscles slowly and completely between each repetition.  Repeat __10________ times. Complete exercise ____1______ times per day.   Document Released: 10/08/2004 Document Revised: 02/26/2011 Document Reviewed: 06/21/2008 [facility] Patient Information 2012 [place], [place].

## 2011-11-18 ENCOUNTER — Telehealth: Payer: Self-pay | Admitting: Orthopedic Surgery

## 2011-11-18 NOTE — Telephone Encounter (Signed)
Advised refill to come from prescribing dr Gerda Diss

## 2011-12-02 ENCOUNTER — Ambulatory Visit: Payer: Medicare Other | Admitting: Orthopedic Surgery

## 2011-12-03 ENCOUNTER — Encounter: Payer: Self-pay | Admitting: Orthopedic Surgery

## 2011-12-03 ENCOUNTER — Ambulatory Visit (INDEPENDENT_AMBULATORY_CARE_PROVIDER_SITE_OTHER): Payer: Medicare Other | Admitting: Orthopedic Surgery

## 2011-12-03 VITALS — BP 142/90 | Ht 61.0 in | Wt 189.0 lb

## 2011-12-03 DIAGNOSIS — M23329 Other meniscus derangements, posterior horn of medial meniscus, unspecified knee: Secondary | ICD-10-CM

## 2011-12-03 DIAGNOSIS — IMO0002 Reserved for concepts with insufficient information to code with codable children: Secondary | ICD-10-CM

## 2011-12-03 DIAGNOSIS — M1710 Unilateral primary osteoarthritis, unspecified knee: Secondary | ICD-10-CM | POA: Insufficient documentation

## 2011-12-03 MED ORDER — DICLOFENAC SODIUM 75 MG PO TBEC: 75.0000 mg | DELAYED_RELEASE_TABLET | Freq: Two times a day (BID) | ORAL | Status: DC

## 2011-12-03 NOTE — Progress Notes (Signed)
Patient ID: Kerry Richardson, female   DOB: January 12, 1941, 71 y.o.   MRN: [id number] Chief Complaint  Patient presents with  . Follow-up    3 week recheck left knee    The knee has improved the pain has decreased range of motion has improved after injection, exercises and continue the Voltaren. The bracing with an economy hinged brace is also help.  Review of systems no locking or giving way still has some mild medial pain over the bursa and the joint line  She is ambulating with her brace. There is some tenderness over the bursa as well as the joint line. Range of motion is normal there is no joint effusion the knee is stable strength is normal the scans intact pulses are normal the McMurray sign is negative  Impression osteoarthritis versus accompanying medial meniscal tear  Continue exercises, bracing and Voltaren for the next 3 weeks. She will call us if she is not back to normal we will order MRI of the knee. At that time we can also remove the brace to see if she can tolerate activity without it  Copy primary care physician

## 2011-12-03 NOTE — Patient Instructions (Addendum)
Brace  Exercises  voltaren bid for 3 more weeks   Call the office in 3 weeks let us know if better or not

## 2011-12-23 ENCOUNTER — Telehealth: Payer: Self-pay | Admitting: Orthopedic Surgery

## 2011-12-23 NOTE — Telephone Encounter (Signed)
Not sure if you saw this   From last progress note: She will call us if she is not back to normal we will order MRI of the knee.  jaime order mri

## 2011-12-23 NOTE — Telephone Encounter (Signed)
Patient called, stating she is "reporting in" as advised.  Per last office visit 12/03/11, she was to call back if knee was not better, or not, after the following treatment:  "Brace    Exercises   voltaren bid for 3 more weeks   Call the office in 3 weeks let us know if better or not"   - she states it is not better.  Please call, please advise of any next steps, at ph# [phone].

## 2011-12-24 ENCOUNTER — Other Ambulatory Visit: Payer: Self-pay | Admitting: *Deleted

## 2011-12-24 DIAGNOSIS — S83209A Unspecified tear of unspecified meniscus, current injury, unspecified knee, initial encounter: Secondary | ICD-10-CM

## 2011-12-24 NOTE — Telephone Encounter (Signed)
MRI ordered

## 2012-01-04 ENCOUNTER — Telehealth: Payer: Self-pay | Admitting: Radiology

## 2012-01-04 NOTE — Telephone Encounter (Signed)
Patient has MRI appointment at [facility] on 01-08-12 at 9:45. Patient has [employer], authorization # [id number] and it expires on 01-27-12. Patient will follow up in the office for her results.

## 2012-01-05 NOTE — Telephone Encounter (Signed)
Patient aware of MRI and of follow up appointments.

## 2012-01-08 ENCOUNTER — Ambulatory Visit (HOSPITAL_COMMUNITY)
Admission: RE | Admit: 2012-01-08 | Discharge: 2012-01-08 | Disposition: A | Discharge status: Home / Self Care | Payer: Medicare Other | Source: Ambulatory Visit | Attending: Orthopedic Surgery | Admitting: Orthopedic Surgery

## 2012-01-08 DIAGNOSIS — S83209A Unspecified tear of unspecified meniscus, current injury, unspecified knee, initial encounter: Secondary | ICD-10-CM

## 2012-01-08 DIAGNOSIS — X58XXXA Exposure to other specified factors, initial encounter: Secondary | ICD-10-CM | POA: Insufficient documentation

## 2012-01-08 DIAGNOSIS — M7120 Synovial cyst of popliteal space [Baker], unspecified knee: Secondary | ICD-10-CM | POA: Insufficient documentation

## 2012-01-08 DIAGNOSIS — IMO0002 Reserved for concepts with insufficient information to code with codable children: Secondary | ICD-10-CM | POA: Insufficient documentation

## 2012-01-14 ENCOUNTER — Ambulatory Visit (INDEPENDENT_AMBULATORY_CARE_PROVIDER_SITE_OTHER): Payer: Medicare Other | Admitting: Orthopedic Surgery

## 2012-01-14 ENCOUNTER — Encounter: Payer: Self-pay | Admitting: Orthopedic Surgery

## 2012-01-14 VITALS — BP 130/78 | Ht 61.0 in | Wt 189.0 lb

## 2012-01-14 DIAGNOSIS — M23329 Other meniscus derangements, posterior horn of medial meniscus, unspecified knee: Secondary | ICD-10-CM

## 2012-01-14 DIAGNOSIS — M1710 Unilateral primary osteoarthritis, unspecified knee: Secondary | ICD-10-CM

## 2012-01-14 DIAGNOSIS — IMO0002 Reserved for concepts with insufficient information to code with codable children: Secondary | ICD-10-CM

## 2012-01-14 NOTE — Patient Instructions (Signed)
Surgery: left knee arthroscopy, medial menisectomy   Arthroscopic Procedure, Knee An arthroscopic procedure can find what is wrong with your knee. PROCEDURE Arthroscopy is a surgical technique that allows your orthopedic surgeon to diagnose and treat your knee injury with accuracy. They will look into your knee through a small instrument. This is almost like a small (pencil sized) telescope. Because arthroscopy affects your knee less than open knee surgery, you can anticipate a more rapid recovery. Taking an active role by following your caregiver's instructions will help with rapid and complete recovery. Use crutches, rest, elevation, ice, and knee exercises as instructed. The length of recovery depends on various factors including type of injury, age, physical condition, medical conditions, and your rehabilitation. Your knee is the joint between the large bones (femur and tibia) in your leg. Cartilage covers these bone ends which are smooth and slippery and allow your knee to bend and move smoothly. Two menisci, thick, semi-lunar shaped pads of cartilage which form a rim inside the joint, help absorb shock and stabilize your knee. Ligaments bind the bones together and support your knee joint. Muscles move the joint, help support your knee, and take stress off the joint itself. Because of this all programs and physical therapy to rehabilitate an injured or repaired knee require rebuilding and strengthening your muscles. AFTER THE PROCEDURE  After the procedure, you will be moved to a recovery area until most of the effects of the medication have worn off. Your caregiver will discuss the test results with you.   Only take over-the-counter or prescription medicines for pain, discomfort, or fever as directed by your caregiver.  SEEK MEDICAL CARE IF:    You have increased bleeding from your wounds.   You see redness, swelling, or have increasing pain in your wounds.   You have pus coming from your  wound.   You have an oral temperature above 102 F (38.9 C).   You notice a bad smell coming from the wound or dressing.   You have severe pain with any motion of your knee.  SEEK IMMEDIATE MEDICAL CARE IF:    You develop a rash.   You have difficulty breathing.   You have any allergic problems.  Document Released: 03/06/2000 Document Revised: 06/01/2011 Document Reviewed: 09/28/2007 [facility] Patient Information 2013 [place], [place].   Wear and Tear Disorders of the Knee (Arthritis, Osteoarthritis) Everyone will experience wear and tear injuries (arthritis, osteoarthritis) of the knee. These are the changes we all get as we age. They come from the joint stress of daily living. The amount of cartilage damage in your knee and your symptoms determine if you need surgery. Mild problems require approximately two months recovery time. More severe problems take several months to recover. With mild problems, your surgeon may find worn and rough cartilage surfaces. With severe changes, your surgeon may find cartilage that has completely worn away and exposed the bone. Loose bodies of bone and cartilage, bone spurs (excess bone growth), and injuries to the menisci (cushions between the large bones of your leg) are also common. All of these problems can cause pain. For a mild wear and tear problem, rough cartilage may simply need to be shaved and smoothed. For more severe problems with areas of exposed bone, your surgeon may use an instrument for roughing up the bone surfaces to stimulate new cartilage growth. Loose bodies are usually removed. Torn menisci may be trimmed or repaired. ABOUT THE ARTHROSCOPIC PROCEDURE Arthroscopy is a surgical technique. It allows your  orthopedic surgeon to diagnose and treat your knee injury with accuracy. The surgeon looks into your knee through a small scope. The scope is like a small (pencil-sized) telescope. Arthroscopy is less invasive than open knee surgery. You can  expect a more rapid recovery. After the procedure, you will be moved to a recovery area until most of the effects of the medication have worn off. Your caregiver will discuss the test results with you. RECOVERY The severity of the arthritis and the type of procedure performed will determine recovery time. Other important factors include age, physical condition, medical conditions, and the type of rehabilitation program. Strengthening your muscles after arthroscopy helps guarantee a better recovery. Follow your caregiver's instructions. Use crutches, rest, elevate, ice, and do knee exercises as instructed. Your caregivers will help you and instruct you with exercises and other physical therapy required to regain your mobility, muscle strength, and functioning following surgery. Only take over-the-counter or prescription medicines for pain, discomfort, or fever as directed by your caregiver.   SEEK MEDICAL CARE IF:    There is increased bleeding (more than a small spot) from the wound.   You notice redness, swelling, or increasing pain in the wound.   Pus is coming from wound.   You develop an unexplained oral temperature above 102 F (38.9 C) , or as your caregiver suggests.   You notice a foul smell coming from the wound or dressing.   You have severe pain with motion of the knee.  SEEK IMMEDIATE MEDICAL CARE IF:    You develop a rash.   You have difficulty breathing.   You have any allergic problems.  MAKE SURE YOU:    Understand these instructions.   Will watch your condition.   Will get help right away if you are not doing well or get worse.  Document Released: 03/06/2000 Document Revised: 06/01/2011 Document Reviewed: 08/03/2007 [facility] Patient Information 2013 [place], [place].     Arthroscopic Procedure, Knee An arthroscopic procedure can find what is wrong with your knee. PROCEDURE Arthroscopy is a surgical technique that allows your orthopedic surgeon to diagnose and  treat your knee injury with accuracy. They will look into your knee through a small instrument. This is almost like a small (pencil sized) telescope. Because arthroscopy affects your knee less than open knee surgery, you can anticipate a more rapid recovery. Taking an active role by following your caregiver's instructions will help with rapid and complete recovery. Use crutches, rest, elevation, ice, and knee exercises as instructed. The length of recovery depends on various factors including type of injury, age, physical condition, medical conditions, and your rehabilitation. Your knee is the joint between the large bones (femur and tibia) in your leg. Cartilage covers these bone ends which are smooth and slippery and allow your knee to bend and move smoothly. Two menisci, thick, semi-lunar shaped pads of cartilage which form a rim inside the joint, help absorb shock and stabilize your knee. Ligaments bind the bones together and support your knee joint. Muscles move the joint, help support your knee, and take stress off the joint itself. Because of this all programs and physical therapy to rehabilitate an injured or repaired knee require rebuilding and strengthening your muscles. AFTER THE PROCEDURE  After the procedure, you will be moved to a recovery area until most of the effects of the medication have worn off. Your caregiver will discuss the test results with you.   Only take over-the-counter or prescription medicines for pain, discomfort, or  fever as directed by your caregiver.    You have been scheduled for arthroscocpic knee surgery.  All surgeries carry some risk.  Remember you always have the option of continued nonsurgical treatment. However in this situation the risks vs. the benefits favor surgery as the best treatment option. The risks of the surgery includes the following but is not limited to bleeding, infection, pulmonary embolus, death from anesthesia, nerve injury vascular injury or need  for further surgery, continued pain.  Specific to this procedure the following risks and complications are rare but possible Stiffness, pain, weakness, giving out  I expect  recovery will be in 3-4 weeks some patients take 6 weeks.  You  will need physical therapy after the procedure  Stop any blood thinning medication: such as warfarin, coumadin, naprosyn, ibuprofen, advil, diclofenac, aspirin

## 2012-01-14 NOTE — H&P (Signed)
HPI Comments: The patient's pain started in the back of the knee and was present for a couple of weeks and then she got up stood on her leg and had acute pain on the medial side of the knee which was severe and has worsened despite being on Voltaren 75 mg twice a day and prior to that being on ibuprofen over-the-counter. The knee feels like it would give way. When she stood on the knee it felt as if something for her.  Knee Pain  The incident occurred more than 1 week ago (2 months ago). The incident occurred at home. There was no injury mechanism. The pain is present in the right knee (Medial side). The quality of the pain is described as aching. The pain is moderate. The pain has been worsening since onset. Associated symptoms include an inability to bear weight and a loss of motion. Pertinent negatives include no loss of sensation, muscle weakness, numbness or tingling.   Review of Systems  Neurological: Negative for tingling and numbness.  positive findings weight gain, watering of the eyes, cough, snoring, numbness. There negative findings chest pain heartburn frequency joint pain other than stated skin changes nervousness bleeding thirst seasonal allergy    No significant family medical history   Past Medical History   Diagnosis  Date   .  Bulging discs    .  Bursitis     Past Surgical History   Procedure  Date   .  Cervical fusion    .  Abdominal hysterectomy    .  Foot surgery    .  Carpal tunnel release      bilateral    History   Substance Use Topics   .  Smoking status:  Never Smoker   .  Smokeless tobacco:  Not on file   .  Alcohol Use:  No    Objective:   Physical Exam  Constitutional: She is oriented to person, place, and time. She appears well-developed and well-nourished.  Neck: Normal range of motion.  Cardiovascular: Normal rate and intact distal pulses.  Pulmonary/Chest: Effort normal. No respiratory distress.  Abdominal: She exhibits no distension.    Musculoskeletal:  Right shoulder: Normal.  Left shoulder: Normal.  Right hip: Normal. She exhibits normal range of motion, normal strength, no tenderness, no swelling, no crepitus and no deformity.  Left hip: Normal. She exhibits normal range of motion, normal strength, no tenderness, no bony tenderness, no swelling, no crepitus and no deformity.  Right knee: Normal. She exhibits normal range of motion, no effusion, normal alignment, no LCL laxity and no MCL laxity. no tenderness found.  Left knee: She exhibits swelling, effusion, abnormal patellar mobility and abnormal meniscus. She exhibits normal range of motion, no ecchymosis, no deformity, no laceration, no erythema, normal alignment, no LCL laxity, no bony tenderness and no MCL laxity. tenderness found. Medial joint line tenderness noted. No lateral joint line, no MCL, no LCL and no patellar tendon tenderness noted.  Lymphadenopathy:  She has no cervical adenopathy.  Neurological: She is alert and oriented to person, place, and time. She has normal reflexes.  Skin: Skin is warm and dry.  Psychiatric: She has a normal mood and affect. Her behavior is normal. Judgment and thought content normal.  imaging we see that she does have some degenerative arthritis symmetric small effusion moderate amount of narrowing  Assessment:   medial meniscal tear  Osteoarthritis  Effusion  Plan:   Previous treatment:  Injection, bracing, continue anti-inflammatories  At this point we've both decided that she will need arthroscopic intervention  Plan arthroscopy left knee partial medial meniscectomy

## 2012-01-14 NOTE — Progress Notes (Signed)
Patient ID: Kerry Richardson, female   DOB: 05-26-1940, 71 y.o.   MRN: [id number] Chief Complaint  Patient presents with  . Results    discuss MRI  left knee results and surgery    1. Medial meniscus, posterior horn derangement   2. Osteoarthritis, knee     MRI followup reveals that the patient has a torn medial meniscus and osteoarthritis of the knee possible loose body with Baker's cyst  Preop visit. Procedure explained risks and benefits explained patient education distributed  History and physical be dictated. Incorporated by reference  Arthroscopy left knee partial medial meniscectomy

## 2012-02-17 ENCOUNTER — Encounter (HOSPITAL_COMMUNITY): Payer: Self-pay | Admitting: Pharmacy Technician

## 2012-02-29 ENCOUNTER — Telehealth: Payer: Self-pay | Admitting: Orthopedic Surgery

## 2012-02-29 NOTE — Telephone Encounter (Signed)
Received call back from [facility], 4:24p.m. 02/29/12.  Per Tana Coast, no pre-authorization required for procedure codes noted. Her name and today's date for reference.

## 2012-02-29 NOTE — Telephone Encounter (Signed)
Contacted [employer] at [phone], regarding out-patient surgery scheduled at [facility], for 03/14/12.  Received automated voice response system; entered option for a call back, due to 41-minute wait time.

## 2012-03-07 NOTE — Patient Instructions (Signed)
[name]  03/07/2012   Your procedure is scheduled on:  03/14/12  Report to [facility] at [place] AM.  Call this number if you have problems the morning of surgery: [phone]   Remember:   Do not eat food:After Midnight.  May have clear liquids:until Midnight .  Clear liquids include soda, tea, black coffee, apple or grape juice, broth.  Take these medicines the morning of surgery with A SIP OF WATER: none   Do not wear jewelry, make-up or nail polish.  Do not wear lotions, powders, or perfumes. You may wear deodorant.  Do not shave 48 hours prior to surgery. Men may shave face and neck.  Do not bring valuables to the hospital.  Contacts, dentures or bridgework may not be worn into surgery.  Leave suitcase in the car. After surgery it may be brought to your room.  For patients admitted to the hospital, checkout time is 11:00 AM the day of discharge.   Patients discharged the day of surgery will not be allowed to drive home.  Name and phone number of your driver: family  Special Instructions: Shower using CHG 2 nights before surgery and the night before surgery.  If you shower the day of surgery use CHG.  Use special wash - you have one bottle of CHG for all showers.  You should use approximately 1/3 of the bottle for each shower.   Please read over the following fact sheets that you were given: Pain Booklet, MRSA Information, Surgical Site Infection Prevention, Anesthesia Post-op Instructions and Care and Recovery After Surgery   PATIENT INSTRUCTIONS POST-ANESTHESIA  IMMEDIATELY FOLLOWING SURGERY:  Do not drive or operate machinery for the first twenty four hours after surgery.  Do not make any important decisions for twenty four hours after surgery or while taking narcotic pain medications or sedatives.  If you develop intractable nausea and vomiting or a severe headache please notify your doctor immediately.  FOLLOW-UP:  Please make an appointment with your surgeon as  instructed. You do not need to follow up with anesthesia unless specifically instructed to do so.  WOUND CARE INSTRUCTIONS (if applicable):  Keep a dry clean dressing on the anesthesia/puncture wound site if there is drainage.  Once the wound has quit draining you may leave it open to air.  Generally you should leave the bandage intact for twenty four hours unless there is drainage.  If the epidural site drains for more than 36-48 hours please call the anesthesia department.  QUESTIONS?:  Please feel free to call your physician or the hospital operator if you have any questions, and they will be happy to assist you.      Arthroscopic Procedure, Knee An arthroscopic procedure can find what is wrong with your knee. PROCEDURE Arthroscopy is a surgical technique that allows your orthopedic surgeon to diagnose and treat your knee injury with accuracy. They will look into your knee through a small instrument. This is almost like a small (pencil sized) telescope. Because arthroscopy affects your knee less than open knee surgery, you can anticipate a more rapid recovery. Taking an active role by following your caregiver's instructions will help with rapid and complete recovery. Use crutches, rest, elevation, ice, and knee exercises as instructed. The length of recovery depends on various factors including type of injury, age, physical condition, medical conditions, and your rehabilitation. Your knee is the joint between the large bones (femur and tibia) in your leg. Cartilage covers these bone ends which are smooth  and slippery and allow your knee to bend and move smoothly. Two menisci, thick, semi-lunar shaped pads of cartilage which form a rim inside the joint, help absorb shock and stabilize your knee. Ligaments bind the bones together and support your knee joint. Muscles move the joint, help support your knee, and take stress off the joint itself. Because of this all programs and physical therapy to  rehabilitate an injured or repaired knee require rebuilding and strengthening your muscles. AFTER THE PROCEDURE  After the procedure, you will be moved to a recovery area until most of the effects of the medication have worn off. Your caregiver will discuss the test results with you.  Only take over-the-counter or prescription medicines for pain, discomfort, or fever as directed by your caregiver. SEEK MEDICAL CARE IF:   You have increased bleeding from your wounds.  You see redness, swelling, or have increasing pain in your wounds.  You have pus coming from your wound.  You have an oral temperature above 102 F (38.9 C).  You notice a bad smell coming from the wound or dressing.  You have severe pain with any motion of your knee. SEEK IMMEDIATE MEDICAL CARE IF:   You develop a rash.  You have difficulty breathing.  You have any allergic problems. Document Released: 03/06/2000 Document Revised: 06/01/2011 Document Reviewed: 09/28/2007 [facility] Patient Information 2013 [place], [place].

## 2012-03-08 ENCOUNTER — Telehealth: Payer: Self-pay | Admitting: *Deleted

## 2012-03-08 ENCOUNTER — Encounter (HOSPITAL_COMMUNITY): Payer: Self-pay

## 2012-03-08 ENCOUNTER — Encounter (HOSPITAL_COMMUNITY)
Admission: RE | Admit: 2012-03-08 | Discharge: 2012-03-08 | Disposition: A | Discharge status: Home / Self Care | Payer: Medicare Other | Source: Ambulatory Visit | Attending: Orthopedic Surgery | Admitting: Orthopedic Surgery

## 2012-03-08 ENCOUNTER — Other Ambulatory Visit: Payer: Self-pay

## 2012-03-08 HISTORY — DX: Other specified postprocedural states: R11.2

## 2012-03-08 HISTORY — DX: Unspecified osteoarthritis, unspecified site: M19.90

## 2012-03-08 HISTORY — DX: Other specified postprocedural states: Z98.890

## 2012-03-08 LAB — SURGICAL PCR SCREEN
MRSA, PCR: NEGATIVE
Staphylococcus aureus: NEGATIVE

## 2012-03-08 LAB — CBC
Hemoglobin: 12.8 g/dL (ref 12.0–15.0)
RBC: 4.29 MIL/uL (ref 3.87–5.11)

## 2012-03-08 LAB — BASIC METABOLIC PANEL
CO2: 29 mEq/L (ref 19–32)
Calcium: 9.5 mg/dL (ref 8.4–10.5)
Chloride: 101 mEq/L (ref 96–112)
Glucose, Bld: 97 mg/dL (ref 70–99)
Potassium: 4.4 mEq/L (ref 3.5–5.1)
Sodium: 137 mEq/L (ref 135–145)

## 2012-03-08 MED ORDER — CHLORHEXIDINE GLUCONATE 4 % EX LIQD: 60.0000 mL | Freq: Once | CUTANEOUS | Status: DC

## 2012-03-08 NOTE — Telephone Encounter (Signed)
Ok order thru epic   [employer]

## 2012-03-08 NOTE — Telephone Encounter (Signed)
Patient informed Rx faxed to [facility].

## 2012-03-08 NOTE — Telephone Encounter (Signed)
French Ana from day surgery called stating patient. Request a Rx for a walker. Her surgery is scheduled for 03/14/12. Patient cell [phone]

## 2012-03-13 NOTE — H&P (Signed)
Kerry Richardson is an 71 y.o. female.   Chief Complaint: left knee pain  HPI: She presented with pain swelling and mechanical symptoms which sudsided then reappeared after treatment with ibuprofen and voltaren and an injection. The pain and discomfort became unacceptable to her and she had an MRI which showed a meniscal tear and oa of the knee. She was unhappy with her function and pain and decided to proceed with arthroscopic surgery vs more non operative care   Past Medical History  Diagnosis Date  . Bulging discs   . Bursitis   . Arthritis   . H/O removal of cyst     finger (uncertain of which finger)  . PONV (postoperative nausea and vomiting)     Past Surgical History  Procedure Date  . Cervical fusion   . Abdominal hysterectomy   . Foot surgery   . Carpal tunnel release     bilateral    No family history on file. Social History:  reports that she has never smoked. She does not have any smokeless tobacco history on file. She reports that she does not drink alcohol or use illicit drugs.  Allergies:  Allergies  Allergen Reactions  . Codeine Nausea And Vomiting    No prescriptions prior to admission    No results found for this or any previous visit (from the past 48 hour(s)). No results found.  Review of Systems  Constitutional: Negative.   HENT: Negative.   Eyes: Negative.   Respiratory: Negative.   Cardiovascular: Negative.   Gastrointestinal: Negative.   Genitourinary: Negative.   Musculoskeletal: Positive for joint pain.  Skin: Negative.   Neurological: Negative.   Endo/Heme/Allergies: Negative.   Psychiatric/Behavioral: Negative.     There were no vitals taken for this visit. Physical Exam  Constitutional: She is oriented to person, place, and time. She appears well-developed and well-nourished.  HENT:  Head: Normocephalic and atraumatic.  Eyes: Pupils are equal, round, and reactive to light.  Neck: Normal range of motion. Neck supple. No JVD present.  No tracheal deviation present. No thyromegaly present.  Cardiovascular: Normal rate.   Respiratory: Effort normal.  GI: Soft.  Musculoskeletal: Normal range of motion.       Right shoulder: Normal.       Left shoulder: Normal.       Right hip: Normal.       Left hip: Normal.       Right knee: Normal.       Left knee: She exhibits swelling, effusion and abnormal meniscus. She exhibits normal range of motion, no ecchymosis, no deformity, no laceration, no erythema, normal alignment, no LCL laxity, normal patellar mobility, no bony tenderness and no MCL laxity. tenderness found. Medial joint line tenderness noted. No lateral joint line, no MCL, no LCL and no patellar tendon tenderness noted.       Right ankle: Normal.       Left ankle: Normal.  Lymphadenopathy:    She has no cervical adenopathy.  Neurological: She is alert and oriented to person, place, and time. She has normal reflexes.  Skin: Skin is warm and dry.  Psychiatric: She has a normal mood and affect. Her behavior is normal. Judgment and thought content normal.     Assessment/Plan IMPRESSION:  1. Large radial tear through the root of the posterior horn of the  medial meniscus. Oblique tear in the posterior half of the body of  the medial meniscus is also identified.  2. Degenerative disease about the  knee appears worst in the medial  and patellofemoral compartments.  3. Small Baker's cyst containing a loose body.  1 medial meniscus tear 2 oa left knee  3 bakers cyst 4 loose bdy?  Plan  SALK MEDIAL MENISECTOMY   Fuller Canada 03/13/2012, 8:24 PM

## 2012-03-14 ENCOUNTER — Encounter (HOSPITAL_COMMUNITY): Payer: Self-pay | Admitting: *Deleted

## 2012-03-14 ENCOUNTER — Encounter (HOSPITAL_COMMUNITY): Payer: Self-pay | Admitting: Anesthesiology

## 2012-03-14 ENCOUNTER — Encounter (HOSPITAL_COMMUNITY)
Admission: RE | Disposition: A | Discharge status: Home / Self Care | Payer: Self-pay | Source: Ambulatory Visit | Attending: Orthopedic Surgery

## 2012-03-14 ENCOUNTER — Ambulatory Visit (HOSPITAL_COMMUNITY): Payer: Medicare Other | Admitting: Anesthesiology

## 2012-03-14 ENCOUNTER — Ambulatory Visit (HOSPITAL_COMMUNITY)
Admission: RE | Admit: 2012-03-14 | Discharge: 2012-03-14 | Disposition: A | Discharge status: Home / Self Care | Payer: Medicare Other | Source: Ambulatory Visit | Attending: Orthopedic Surgery | Admitting: Orthopedic Surgery

## 2012-03-14 DIAGNOSIS — IMO0002 Reserved for concepts with insufficient information to code with codable children: Secondary | ICD-10-CM

## 2012-03-14 DIAGNOSIS — M1710 Unilateral primary osteoarthritis, unspecified knee: Secondary | ICD-10-CM

## 2012-03-14 HISTORY — PX: KNEE ARTHROSCOPY WITH MEDIAL MENISECTOMY: SHX5651

## 2012-03-14 HISTORY — PX: CHONDROPLASTY: SHX5177

## 2012-03-14 SURGERY — ARTHROSCOPY, KNEE, WITH MEDIAL MENISCECTOMY
Anesthesia: General | Site: Knee | Laterality: Left | Wound class: Clean

## 2012-03-14 MED ORDER — HYDROCODONE-ACETAMINOPHEN 5-325 MG PO TABS: 1.0000 | ORAL_TABLET | ORAL | Status: DC | PRN

## 2012-03-14 MED ORDER — ONDANSETRON HCL 4 MG/2ML IJ SOLN
INTRAMUSCULAR | Status: AC
  Filled 2012-03-14: qty 2

## 2012-03-14 MED ORDER — MIDAZOLAM HCL 2 MG/2ML IJ SOLN
1.0000 mg | INTRAMUSCULAR | Status: DC | PRN
  Administered 2012-03-14: 2 mg via INTRAVENOUS

## 2012-03-14 MED ORDER — FENTANYL CITRATE 0.05 MG/ML IJ SOLN
INTRAMUSCULAR | Status: AC
  Filled 2012-03-14: qty 2

## 2012-03-14 MED ORDER — LACTATED RINGERS IV SOLN
INTRAVENOUS | Status: DC
  Administered 2012-03-14: 1000 mL via INTRAVENOUS

## 2012-03-14 MED ORDER — FENTANYL CITRATE 0.05 MG/ML IJ SOLN
25.0000 ug | INTRAMUSCULAR | Status: DC | PRN
  Administered 2012-03-14: 50 ug via INTRAVENOUS

## 2012-03-14 MED ORDER — ONDANSETRON HCL 4 MG/2ML IJ SOLN: 4.0000 mg | Freq: Once | INTRAMUSCULAR | Status: DC | PRN

## 2012-03-14 MED ORDER — EPINEPHRINE HCL 1 MG/ML IJ SOLN
INTRAMUSCULAR | Status: AC
  Filled 2012-03-14: qty 5

## 2012-03-14 MED ORDER — PROMETHAZINE HCL 25 MG PO TABS: 25.0000 mg | ORAL_TABLET | ORAL | Status: DC

## 2012-03-14 MED ORDER — ACETAMINOPHEN 10 MG/ML IV SOLN: 1000.0000 mg | Freq: Four times a day (QID) | INTRAVENOUS | Status: DC

## 2012-03-14 MED ORDER — FENTANYL CITRATE 0.05 MG/ML IJ SOLN
INTRAMUSCULAR | Status: DC | PRN
  Administered 2012-03-14 (×4): 25 ug via INTRAVENOUS

## 2012-03-14 MED ORDER — BUPIVACAINE-EPINEPHRINE PF 0.5-1:200000 % IJ SOLN
INTRAMUSCULAR | Status: AC
  Filled 2012-03-14: qty 20

## 2012-03-14 MED ORDER — DEXAMETHASONE SODIUM PHOSPHATE 4 MG/ML IJ SOLN
INTRAMUSCULAR | Status: AC
  Filled 2012-03-14: qty 1

## 2012-03-14 MED ORDER — ARTIFICIAL TEARS OP OINT
TOPICAL_OINTMENT | OPHTHALMIC | Status: AC
  Filled 2012-03-14: qty 3.5

## 2012-03-14 MED ORDER — CEFAZOLIN SODIUM-DEXTROSE 2-3 GM-% IV SOLR: 2.0000 g | INTRAVENOUS | Status: DC

## 2012-03-14 MED ORDER — MIDAZOLAM HCL 2 MG/2ML IJ SOLN
INTRAMUSCULAR | Status: AC
  Filled 2012-03-14: qty 2

## 2012-03-14 MED ORDER — PROPOFOL 10 MG/ML IV EMUL
INTRAVENOUS | Status: DC | PRN
  Administered 2012-03-14: 130 mg via INTRAVENOUS

## 2012-03-14 MED ORDER — METHYLPREDNISOLONE ACETATE 40 MG/ML IJ SUSP
INTRAMUSCULAR | Status: AC
  Filled 2012-03-14: qty 5

## 2012-03-14 MED ORDER — LACTATED RINGERS IV SOLN
INTRAVENOUS | Status: DC | PRN
  Administered 2012-03-14: 07:00:00 via INTRAVENOUS

## 2012-03-14 MED ORDER — SCOPOLAMINE 1 MG/3DAYS TD PT72
MEDICATED_PATCH | TRANSDERMAL | Status: AC
  Filled 2012-03-14: qty 1

## 2012-03-14 MED ORDER — KETOROLAC TROMETHAMINE 30 MG/ML IJ SOLN
INTRAMUSCULAR | Status: AC
  Filled 2012-03-14: qty 1

## 2012-03-14 MED ORDER — BUPIVACAINE-EPINEPHRINE PF 0.5-1:200000 % IJ SOLN
INTRAMUSCULAR | Status: DC | PRN
  Administered 2012-03-14 (×2): 30 mL

## 2012-03-14 MED ORDER — DEXAMETHASONE SODIUM PHOSPHATE 4 MG/ML IJ SOLN
4.0000 mg | Freq: Once | INTRAMUSCULAR | Status: AC
  Administered 2012-03-14: 4 mg via INTRAVENOUS

## 2012-03-14 MED ORDER — ONDANSETRON HCL 4 MG/2ML IJ SOLN
4.0000 mg | Freq: Once | INTRAMUSCULAR | Status: AC
  Administered 2012-03-14: 4 mg via INTRAVENOUS

## 2012-03-14 MED ORDER — PROMETHAZINE HCL 12.5 MG PO TABS
25.0000 mg | ORAL_TABLET | Freq: Four times a day (QID) | ORAL | Status: DC | PRN
  Filled 2012-03-14: qty 1

## 2012-03-14 MED ORDER — CEFAZOLIN SODIUM-DEXTROSE 2-3 GM-% IV SOLR
INTRAVENOUS | Status: AC
  Filled 2012-03-14: qty 50

## 2012-03-14 MED ORDER — SCOPOLAMINE 1 MG/3DAYS TD PT72
1.0000 | MEDICATED_PATCH | Freq: Once | TRANSDERMAL | Status: DC
  Administered 2012-03-14: 1.5 mg via TRANSDERMAL

## 2012-03-14 MED ORDER — LIDOCAINE HCL (CARDIAC) 10 MG/ML IV SOLN
INTRAVENOUS | Status: DC | PRN
  Administered 2012-03-14: 50 mg via INTRAVENOUS

## 2012-03-14 MED ORDER — SODIUM CHLORIDE 0.9 % IR SOLN
Status: DC | PRN
  Administered 2012-03-14 (×2)

## 2012-03-14 MED ORDER — KETOROLAC TROMETHAMINE 30 MG/ML IJ SOLN
30.0000 mg | Freq: Once | INTRAMUSCULAR | Status: AC
  Administered 2012-03-14: 30 mg via INTRAVENOUS

## 2012-03-14 MED ORDER — TRAMADOL HCL 50 MG PO TABS
50.0000 mg | ORAL_TABLET | Freq: Once | ORAL | Status: AC
  Administered 2012-03-14: 50 mg via ORAL

## 2012-03-14 MED ORDER — METHYLPREDNISOLONE ACETATE 40 MG/ML IJ SUSP
INTRAMUSCULAR | Status: DC | PRN
  Administered 2012-03-14: 40 mg via INTRA_ARTICULAR

## 2012-03-14 MED ORDER — LIDOCAINE HCL (PF) 1 % IJ SOLN
INTRAMUSCULAR | Status: AC
  Filled 2012-03-14: qty 5

## 2012-03-14 MED ORDER — ONDANSETRON HCL 4 MG/2ML IJ SOLN: 4.0000 mg | Freq: Once | INTRAMUSCULAR | Status: DC

## 2012-03-14 MED ORDER — PROPOFOL 10 MG/ML IV EMUL
INTRAVENOUS | Status: AC
  Filled 2012-03-14: qty 20

## 2012-03-14 MED ORDER — ACETAMINOPHEN 10 MG/ML IV SOLN
INTRAVENOUS | Status: AC
  Filled 2012-03-14: qty 100

## 2012-03-14 MED ORDER — CEFAZOLIN SODIUM-DEXTROSE 2-3 GM-% IV SOLR
INTRAVENOUS | Status: DC | PRN
  Administered 2012-03-14: 2 g via INTRAVENOUS

## 2012-03-14 MED ORDER — SODIUM CHLORIDE 0.9 % IR SOLN
Status: DC | PRN
  Administered 2012-03-14: 1000 mL

## 2012-03-14 SURGICAL SUPPLY — 55 items
0.9% NACL IRRIGATION 3000ML IMPLANT
ARTHROWAND PARAGON T2 (SURGICAL WAND)
BAG HAMPER (MISCELLANEOUS) ×2 IMPLANT
BANDAGE ELASTIC 6 VELCRO NS (GAUZE/BANDAGES/DRESSINGS) ×2 IMPLANT
BLADE AGGRESSIVE PLUS 4.0 (BLADE) ×2 IMPLANT
BLADE SURG SZ11 CARB STEEL (BLADE) ×2 IMPLANT
CHLORAPREP W/TINT 26ML (MISCELLANEOUS) ×2 IMPLANT
CLOTH BEACON ORANGE TIMEOUT ST (SAFETY) ×2 IMPLANT
COOLER CRYO IC GRAV AND TUBE (ORTHOPEDIC SUPPLIES) ×2 IMPLANT
CUFF CRYO KNEE LG 20X31 COOLER (ORTHOPEDIC SUPPLIES) IMPLANT
CUFF CRYO KNEE18X23 MED (MISCELLANEOUS) ×1 IMPLANT
CUFF TOURNIQUET SINGLE 34IN LL (TOURNIQUET CUFF) ×2 IMPLANT
CUFF TOURNIQUET SINGLE 44IN (TOURNIQUET CUFF) IMPLANT
CUTTER ANGLED DBL BITE 4.5 (BURR) IMPLANT
DECANTER SPIKE VIAL GLASS SM (MISCELLANEOUS) ×4 IMPLANT
EPINEPHRINE 1MG/ML (1:1000) IMPLANT
GAUZE SPONGE 4X4 16PLY XRAY LF (GAUZE/BANDAGES/DRESSINGS) ×2 IMPLANT
GAUZE XEROFORM 5X9 LF (GAUZE/BANDAGES/DRESSINGS) ×2 IMPLANT
GLOVE BIOGEL PI IND STRL 7.0 (GLOVE) ×1 IMPLANT
GLOVE BIOGEL PI INDICATOR 7.0 (GLOVE) ×1
GLOVE EXAM NITRILE MD LF STRL (GLOVE) ×2 IMPLANT
GLOVE SKINSENSE NS SZ8.0 LF (GLOVE) ×1
GLOVE SKINSENSE STRL SZ8.0 LF (GLOVE) ×1 IMPLANT
GLOVE SS BIOGEL STRL SZ 6.5 (GLOVE) IMPLANT
GLOVE SS N UNI LF 8.5 STRL (GLOVE) ×2 IMPLANT
GLOVE SUPERSENSE BIOGEL SZ 6.5 (GLOVE) ×1
GOWN STRL REIN XL XLG (GOWN DISPOSABLE) ×5 IMPLANT
HLDR LEG FOAM (MISCELLANEOUS) ×1 IMPLANT
IV NS IRRIG 3000ML ARTHROMATIC (IV SOLUTION) ×4 IMPLANT
KIT BLADEGUARD II DBL (SET/KITS/TRAYS/PACK) ×2 IMPLANT
KIT ROOM TURNOVER AP CYSTO (KITS) ×2 IMPLANT
LEG HOLDER FOAM (MISCELLANEOUS) ×1
MANIFOLD NEPTUNE II (INSTRUMENTS) ×2 IMPLANT
MARKER SKIN DUAL TIP RULER LAB (MISCELLANEOUS) ×2 IMPLANT
NDL HYPO 18GX1.5 BLUNT FILL (NEEDLE) ×1 IMPLANT
NDL SPNL 18GX3.5 QUINCKE PK (NEEDLE) ×1 IMPLANT
NEEDLE HYPO 18GX1.5 BLUNT FILL (NEEDLE) ×2 IMPLANT
NEEDLE HYPO 21X1.5 SAFETY (NEEDLE) ×2 IMPLANT
NEEDLE SPNL 18GX3.5 QUINCKE PK (NEEDLE) ×2 IMPLANT
NS IRRIG 1000ML POUR BTL (IV SOLUTION) ×2 IMPLANT
PACK ARTHRO LIMB DRAPE STRL (MISCELLANEOUS) ×2 IMPLANT
PAD ABD 5X9 TENDERSORB (GAUZE/BANDAGES/DRESSINGS) ×2 IMPLANT
PAD ARMBOARD 7.5X6 YLW CONV (MISCELLANEOUS) ×2 IMPLANT
PADDING CAST COTTON 6X4 STRL (CAST SUPPLIES) ×2 IMPLANT
SET ARTHROSCOPY INST (INSTRUMENTS) ×2 IMPLANT
SET ARTHROSCOPY PUMP TUBE (IRRIGATION / IRRIGATOR) ×2 IMPLANT
SET BASIN LINEN APH (SET/KITS/TRAYS/PACK) ×2 IMPLANT
SPONGE GAUZE 4X4 12PLY (GAUZE/BANDAGES/DRESSINGS) ×2 IMPLANT
SUT ETHILON 3 0 FSL (SUTURE) ×1 IMPLANT
SYR 30ML LL (SYRINGE) ×2 IMPLANT
SYRINGE 10CC LL (SYRINGE) ×2 IMPLANT
WAND 50 DEG COVAC W/CORD (SURGICAL WAND) IMPLANT
WAND 90 DEG TURBOVAC W/CORD (SURGICAL WAND) ×1 IMPLANT
WAND ARTHRO PARAGON T2 (SURGICAL WAND) IMPLANT
YANKAUER SUCT BULB TIP 10FT TU (MISCELLANEOUS) ×7 IMPLANT

## 2012-03-14 NOTE — Anesthesia Postprocedure Evaluation (Signed)
  Anesthesia Post-op Note  Patient: Kerry Richardson  Procedure(s) Performed: Procedure(s) (LRB) with comments: KNEE ARTHROSCOPY WITH MEDIAL MENISECTOMY (Left) CHONDROPLASTY (Left) - medial condyle  Patient Location: PACU  Anesthesia Type: General  Level of Consciousness: awake, alert , oriented and patient cooperative  Airway and Oxygen Therapy: Patient Spontanous Breathing and Patient connected to face mask oxygen  Post-op Pain: mild  Post-op Assessment: Post-op Vital signs reviewed, Patient's Cardiovascular Status Stable, Respiratory Function Stable, Patent Airway and No signs of Nausea or vomiting  Post-op Vital Signs: Reviewed and stable  Complications: No apparent anesthesia complications

## 2012-03-14 NOTE — Anesthesia Preprocedure Evaluation (Signed)
Anesthesia Evaluation  Patient identified by MRN, date of birth, ID band Patient awake    Reviewed: Allergy & Precautions, H&P , NPO status , Patient's Chart, lab work & pertinent test results  History of Anesthesia Complications (+) PONV  Airway Mallampati: I TM Distance: >3 FB Neck ROM: Limited    Dental  (+) Teeth Intact   Pulmonary neg pulmonary ROS,  breath sounds clear to auscultation        Cardiovascular negative cardio ROS  Rhythm:Regular Rate:Normal     Neuro/Psych    GI/Hepatic negative GI ROS,   Endo/Other    Renal/GU      Musculoskeletal  (+) Arthritis - (spinal stenosis, chronic LBP, cervical fusion), Osteoarthritis,    Abdominal   Peds  Hematology   Anesthesia Other Findings   Reproductive/Obstetrics                           Anesthesia Physical Anesthesia Plan  ASA: II  Anesthesia Plan: General   Post-op Pain Management:    Induction: Intravenous  Airway Management Planned: LMA  Additional Equipment:   Intra-op Plan:   Post-operative Plan: Extubation in OR  Informed Consent: I have reviewed the patients History and Physical, chart, labs and discussed the procedure including the risks, benefits and alternatives for the proposed anesthesia with the patient or authorized representative who has indicated his/her understanding and acceptance.     Plan Discussed with:   Anesthesia Plan Comments:         Anesthesia Quick Evaluation

## 2012-03-14 NOTE — Anesthesia Procedure Notes (Signed)
Procedure Name: LMA Insertion Date/Time: 03/14/2012 7:39 AM Performed by: Carolyne Littles, AMY L Pre-anesthesia Checklist: Patient identified, Timeout performed, Emergency Drugs available, Suction available and Patient being monitored Patient Re-evaluated:Patient Re-evaluated prior to inductionOxygen Delivery Method: Circle system utilized Preoxygenation: Pre-oxygenation with 100% oxygen Intubation Type: IV induction Ventilation: Mask ventilation without difficulty LMA: LMA inserted LMA Size: 4.0 Number of attempts: 1 Placement Confirmation: positive ETCO2 and breath sounds checked- equal and bilateral Tube secured with: Tape Dental Injury: Teeth and Oropharynx as per pre-operative assessment

## 2012-03-14 NOTE — Brief Op Note (Addendum)
03/14/2012  8:23 AM  PATIENT:  Kerry Richardson  71 y.o. female  PRE-OPERATIVE DIAGNOSIS:  meniscus tear left knee  POST-OPERATIVE DIAGNOSIS:  Degenerative arthritis left knee  PROCEDURE:  Surgical arthroscopy left knee with medial femoral condyle chondroplasty   FINDINGS: Major finding was degenerative arthritis of the left knee. Patellofemoral compartment grade 4 chondral changes on the patella side Medial compartment chondral flap tear normal meniscus Lateral compartment normal Notch normal Overall condition of the knee synovitis  SURGEON:  Surgeon(s) and Role:    * Avonelle Viveros E Brodi Kari, MD - Primary  PHYSICIAN ASSISTANT:   ASSISTANTS: none   ANESTHESIA:   general  EBL:  Total I/O In: 500 [I.V.:500] Out: 0   BLOOD ADMINISTERED:none  DRAINS: none   LOCAL MEDICATIONS USED:  MARCAINE   , Amount: 60 ml and OTHER 40 mg of Depo-Medrol  SPECIMEN:  No Specimen  DISPOSITION OF SPECIMEN:  N/A  COUNTS:  YES  TOURNIQUET:  * Missing tourniquet times found for documented tourniquets in log:  [id number] *  DICTATION: .Dragon Dictation  PLAN OF CARE: Discharge to home after PACU  PATIENT DISPOSITION:  PACU - hemodynamically stable.   Delay start of Pharmacological VTE agent (>24hrs) due to surgical blood loss or risk of bleeding: no, not applicable  Details of procedure the patient was identified in the preop holding area as Kerry Richardson. The chart was reviewed and the consent was signed. The left knee was marked as a surgical site.  The patient was taken to the operating room for general anesthesia. In the supine position the left leg was placed in an arthroscopic leg holder and the right leg was placed in a padded well leg holder.  The left leg was prepped from the thigh down to and including the foot. This was followed by sterile draping. At this point the timeout was completed.  Standard medial and lateral portals were established and the scope was placed into the  suprapatellar pouch. A diagnostic arthroscopy was performed viewing the knee in its entirety.  The findings are as noted above. The chondral flap tear was debrided with an arthroscopic shaver to a stable rim. The medial meniscus was probed visualized and found to be intact. The patellofemoral joint had chondral changes on the lateral and mid ridge of the patella. Lateral compartment was basically normal. Synovitis was noted in the joint, moderate amount.  A second evaluation of the medial meniscus was performed using the probe and was found to be intact and stable without tear.  The portals were closed with 3-0 nylon and then the knee was injected with 60 cc of Marcaine with epinephrine and 40 mg of Depo-Medrol.  Sterile dressings were applied followed by Ace wrap and Cryo/Cuff which was activated. The patient was taken to the recovery room in stable condition.  She will be weightbearing as tolerated with crutches or walker. 

## 2012-03-14 NOTE — Op Note (Signed)
03/14/2012  8:23 AM  PATIENT:  Kerry Richardson  71 y.o. female  PRE-OPERATIVE DIAGNOSIS:  meniscus tear left knee  POST-OPERATIVE DIAGNOSIS:  Degenerative arthritis left knee  PROCEDURE:  Surgical arthroscopy left knee with medial femoral condyle chondroplasty   FINDINGS: Major finding was degenerative arthritis of the left knee. Patellofemoral compartment grade 4 chondral changes on the patella side Medial compartment chondral flap tear normal meniscus Lateral compartment normal Notch normal Overall condition of the knee synovitis  SURGEON:  Surgeon(s) and Role:    * Vickki Hearing, MD - Primary  PHYSICIAN ASSISTANT:   ASSISTANTS: none   ANESTHESIA:   general  EBL:  Total I/O In: 500 [I.V.:500] Out: 0   BLOOD ADMINISTERED:none  DRAINS: none   LOCAL MEDICATIONS USED:  MARCAINE   , Amount: 60 ml and OTHER 40 mg of Depo-Medrol  SPECIMEN:  No Specimen  DISPOSITION OF SPECIMEN:  N/A  COUNTS:  YES  TOURNIQUET:  * Missing tourniquet times found for documented tourniquets in log:  [id number] *  DICTATION: .Reubin Milan Dictation  PLAN OF CARE: Discharge to home after PACU  PATIENT DISPOSITION:  PACU - hemodynamically stable.   Delay start of Pharmacological VTE agent (>24hrs) due to surgical blood loss or risk of bleeding: no, not applicable  Details of procedure the patient was identified in the preop holding area as Kerry Richardson. The chart was reviewed and the consent was signed. The left knee was marked as a surgical site.  The patient was taken to the operating room for general anesthesia. In the supine position the left leg was placed in an arthroscopic leg holder and the right leg was placed in a padded well leg holder.  The left leg was prepped from the thigh down to and including the foot. This was followed by sterile draping. At this point the timeout was completed.  Standard medial and lateral portals were established and the scope was placed into the  suprapatellar pouch. A diagnostic arthroscopy was performed viewing the knee in its entirety.  The findings are as noted above. The chondral flap tear was debrided with an arthroscopic shaver to a stable rim. The medial meniscus was probed visualized and found to be intact. The patellofemoral joint had chondral changes on the lateral and mid ridge of the patella. Lateral compartment was basically normal. Synovitis was noted in the joint, moderate amount.  A second evaluation of the medial meniscus was performed using the probe and was found to be intact and stable without tear.  The portals were closed with 3-0 nylon and then the knee was injected with 60 cc of Marcaine with epinephrine and 40 mg of Depo-Medrol.  Sterile dressings were applied followed by Ace wrap and Cryo/Cuff which was activated. The patient was taken to the recovery room in stable condition.  She will be weightbearing as tolerated with crutches or walker.

## 2012-03-14 NOTE — Interval H&P Note (Signed)
History and Physical Interval Note:  03/14/2012 7:24 AM  Kerry Richardson  has presented today for surgery, with the diagnosis of meniscus tear left knee  The various methods of treatment have been discussed with the patient and family. After consideration of risks, benefits and other options for treatment, the patient has consented to  Procedure(s) (LRB) with comments: KNEE ARTHROSCOPY WITH MEDIAL MENISECTOMY (Left) as a surgical intervention .  The patient's history has been reviewed, patient examined, no change in status, stable for surgery.  I have reviewed the patient's chart and labs.  Questions were answered to the patient's satisfaction.     Fuller Canada SALK medial meniscus.

## 2012-03-14 NOTE — Preoperative (Signed)
Beta Blockers   Reason not to administer Beta Blockers:Not Applicable 

## 2012-03-14 NOTE — Transfer of Care (Signed)
  Anesthesia Post-op Note  Patient: Kerry Richardson  Procedure(s) Performed: Procedure(s) (LRB) with comments: KNEE ARTHROSCOPY WITH MEDIAL MENISECTOMY (Left) CHONDROPLASTY (Left) - medial condyle  Patient Location: PACU  Anesthesia Type: General  Level of Consciousness: awake, alert , oriented and patient cooperative  Airway and Oxygen Therapy: Patient Spontanous Breathing and Patient connected to face mask oxygen  Post-op Pain: mild  Post-op Assessment: Post-op Vital signs reviewed, Patient's Cardiovascular Status Stable, Respiratory Function Stable, Patent Airway and No signs of Nausea or vomiting  Post-op Vital Signs: Reviewed and stable  Complications: No apparent anesthesia complications  

## 2012-03-14 NOTE — Progress Notes (Signed)
Anesthesia aware of order for po phenergan 25mg  . Suggested hold off on phenergan since patient had been given to full anesthesia cocktail. Including zofran  decadron, transderm patch.

## 2012-03-21 ENCOUNTER — Encounter: Payer: Self-pay | Admitting: Orthopedic Surgery

## 2012-03-21 ENCOUNTER — Ambulatory Visit (INDEPENDENT_AMBULATORY_CARE_PROVIDER_SITE_OTHER): Payer: Medicare Other | Admitting: Orthopedic Surgery

## 2012-03-21 VITALS — BP 130/64 | Ht 61.0 in | Wt 189.0 lb

## 2012-03-21 DIAGNOSIS — IMO0002 Reserved for concepts with insufficient information to code with codable children: Secondary | ICD-10-CM

## 2012-03-21 DIAGNOSIS — M1710 Unilateral primary osteoarthritis, unspecified knee: Secondary | ICD-10-CM

## 2012-03-21 MED FILL — Epinephrine HCl Inj 1 MG/ML: INTRAMUSCULAR | Qty: 1 | Status: AC

## 2012-03-21 MED FILL — Sodium Chloride Irrigation Soln 0.9%: Qty: 3000 | Status: AC

## 2012-03-21 NOTE — Progress Notes (Signed)
Patient ID: Kerry Richardson, female   DOB: November 09, 1940, 71 y.o.   MRN: [id number] Chief Complaint  Patient presents with  . Follow-up    post op left knee surgery//12.23.2013    Suture removal   No problems so far   Cane prn   Therapy is at [facility] outpatient   Return in 3 weeks

## 2012-03-21 NOTE — Patient Instructions (Addendum)
Continue therapy at [facility]

## 2012-03-22 ENCOUNTER — Encounter (HOSPITAL_COMMUNITY): Payer: Self-pay | Admitting: Orthopedic Surgery

## 2012-03-24 ENCOUNTER — Telehealth: Payer: Self-pay | Admitting: *Deleted

## 2012-03-24 NOTE — Telephone Encounter (Signed)
Patient called and stated her incision from surgery on 03/14/12 had bled during therapy on 03/22/12 and again today 03/24/12. Wanted to know if she should be concerned. Advised patient that Dr. Alto Denver was out of the office today, but to keep a band aid on it and if it bled again tomorrow to call the office back.

## 2012-04-14 ENCOUNTER — Ambulatory Visit (INDEPENDENT_AMBULATORY_CARE_PROVIDER_SITE_OTHER): Payer: Medicare Other | Admitting: Orthopedic Surgery

## 2012-04-14 VITALS — BP 138/72 | Ht 61.0 in | Wt 189.0 lb

## 2012-04-14 DIAGNOSIS — M23329 Other meniscus derangements, posterior horn of medial meniscus, unspecified knee: Secondary | ICD-10-CM

## 2012-04-14 DIAGNOSIS — IMO0002 Reserved for concepts with insufficient information to code with codable children: Secondary | ICD-10-CM

## 2012-04-14 DIAGNOSIS — M1710 Unilateral primary osteoarthritis, unspecified knee: Secondary | ICD-10-CM

## 2012-04-14 NOTE — Progress Notes (Signed)
Patient ID: Kerry Richardson, female   DOB: 1941-01-09, 72 y.o.   MRN: [id number] Chief Complaint  Patient presents with  . Follow-up    3 week recheck on left knee, SALK. DOS 03-14-12.    Status post arthroscopy left knee severe arthritis noted seems to be doing well. Pain varies she walks without support not requiring any pain medication takes occasional Advil flexion 120 extension 0 minimal swelling  Continue physical therapy return February 19

## 2012-05-11 ENCOUNTER — Ambulatory Visit: Payer: Medicare Other | Admitting: Orthopedic Surgery

## 2012-05-16 ENCOUNTER — Ambulatory Visit (INDEPENDENT_AMBULATORY_CARE_PROVIDER_SITE_OTHER): Payer: Medicare Other | Admitting: Orthopedic Surgery

## 2012-05-16 ENCOUNTER — Encounter: Payer: Self-pay | Admitting: Orthopedic Surgery

## 2012-05-16 VITALS — BP 160/60 | Ht 61.0 in | Wt 189.0 lb

## 2012-05-16 DIAGNOSIS — IMO0002 Reserved for concepts with insufficient information to code with codable children: Secondary | ICD-10-CM

## 2012-05-16 DIAGNOSIS — Z9889 Other specified postprocedural states: Secondary | ICD-10-CM

## 2012-05-16 DIAGNOSIS — M1710 Unilateral primary osteoarthritis, unspecified knee: Secondary | ICD-10-CM

## 2012-05-16 NOTE — Progress Notes (Signed)
Patient ID: Kerry Richardson, female   DOB: 1940-10-12, 72 y.o.   MRN: [id number] Chief Complaint  Patient presents with  . Follow-up    Post op 2 SALK DOS 03/14/12    BP 160/60  Ht 5\' 1"  (1.549 m)  Wt 189 lb (85.73 kg)  BMI 35.73 kg/m2  S/P arthroscopy of left knee - Plan: Ambulatory referral to Physical Therapy  Osteoarthritis, knee   Patient presents as a followup visit status post arthroscopy of left knee at which time we found significant degenerative arthritis. She was improving nicely until she had bronchitis and had to stop doing her physical therapy. She's not been there for several weeks she would like to resume.  Chest free and easy movement of her left knee she does complain of some pain which is climbing steps which is probably related to her patellofemoral arthritis  Recommend continue therapy and reassess the situation in about 6 weeks

## 2012-05-16 NOTE — Patient Instructions (Addendum)
Ok to resume PT

## 2012-06-27 ENCOUNTER — Ambulatory Visit (INDEPENDENT_AMBULATORY_CARE_PROVIDER_SITE_OTHER): Payer: Medicare Other | Admitting: Orthopedic Surgery

## 2012-06-27 VITALS — BP 144/80 | Ht 61.0 in | Wt 189.0 lb

## 2012-06-27 DIAGNOSIS — Z9889 Other specified postprocedural states: Secondary | ICD-10-CM

## 2012-06-27 DIAGNOSIS — M23329 Other meniscus derangements, posterior horn of medial meniscus, unspecified knee: Secondary | ICD-10-CM

## 2012-06-27 DIAGNOSIS — M23322 Other meniscus derangements, posterior horn of medial meniscus, left knee: Secondary | ICD-10-CM

## 2012-06-27 NOTE — Patient Instructions (Addendum)
Home exercises  

## 2012-06-27 NOTE — Progress Notes (Signed)
Patient ID: Kerry Richardson, female   DOB: 08-15-1940, 72 y.o.   MRN: [id number] Chief Complaint  Patient presents with  . Follow-up    6 week recheck on right knee. DOS 03-14-12.    This is related to 3 month check status post arthroscopy of the right knee with meniscectomy she had arthritis she is here for followup she missed some therapy appointments because she was sick been resumed and says she has made good progress  Main complaint at this time are pain when going down the stairs in the front of the knee a little bit of weakness and some lateral right knee pain  Recommend home exercises, then x-ray in 9 months left knee I advised that she should put some ice and topical muscle cream over the right lateral iliotibial band for tendinitis  This is a postop visit that we moved one week later so she is not charged to cope and

## 2012-09-30 ENCOUNTER — Encounter: Payer: Self-pay | Admitting: *Deleted

## 2012-10-05 ENCOUNTER — Encounter: Payer: Self-pay | Admitting: Family Medicine

## 2012-10-05 ENCOUNTER — Ambulatory Visit (INDEPENDENT_AMBULATORY_CARE_PROVIDER_SITE_OTHER): Payer: Medicare Other | Admitting: Family Medicine

## 2012-10-05 VITALS — BP 132/32 | Ht 60.5 in | Wt 195.2 lb

## 2012-10-05 DIAGNOSIS — Z0000 Encounter for general adult medical examination without abnormal findings: Secondary | ICD-10-CM

## 2012-10-05 MED ORDER — ESTROGENS CONJUGATED 0.3 MG PO TABS: 0.3000 mg | ORAL_TABLET | Freq: Every day | ORAL | Status: DC

## 2012-10-05 NOTE — Progress Notes (Signed)
Mini cog-normal Get up and go test -normal-no falls 

## 2012-10-05 NOTE — Progress Notes (Signed)
  Subjective:    Patient ID: Kerry Richardson, female    DOB: Dec 29, 1940, 72 y.o.   MRN: [id number]  HPI  Exercising very little  Diet is so-so. On the run. Fast food tendency.  Recent knee surg--ortho felt good result.   Gym membership, but not regular attendance  Still on premarin, has definitely helped.  Last mammo don't remember  Review of Systems  Constitutional: Negative for activity change, appetite change and fatigue.  HENT: Negative for congestion, rhinorrhea, neck pain and ear discharge.   Eyes: Negative for discharge.  Respiratory: Negative for cough, chest tightness and wheezing.   Cardiovascular: Negative for chest pain.  Gastrointestinal: Negative for vomiting and abdominal pain.  Genitourinary: Negative for frequency and difficulty urinating.  Allergic/Immunologic: Negative for environmental allergies and food allergies.  Neurological: Negative for weakness and headaches.  Psychiatric/Behavioral: Negative for behavioral problems and agitation.       Objective:   Physical Exam  Vitals reviewed. Constitutional: She is oriented to person, place, and time. She appears well-developed and well-nourished.  HENT:  Head: Normocephalic.  Right Ear: External ear normal.  Left Ear: External ear normal.  Eyes: Pupils are equal, round, and reactive to light.  Neck: Normal range of motion. No thyromegaly present.  Cardiovascular: Normal rate, regular rhythm, normal heart sounds and intact distal pulses.   No murmur heard. Pulmonary/Chest: Effort normal and breath sounds normal. No respiratory distress. She has no wheezes.  Abdominal: Soft. Bowel sounds are normal. She exhibits no distension and no mass. There is no tenderness.  Genitourinary: Vagina normal. No vaginal discharge found.  Musculoskeletal: Normal range of motion. She exhibits no edema and no tenderness.  Lymphadenopathy:    She has no cervical adenopathy.  Neurological: She is alert and oriented to person,  place, and time. She exhibits normal muscle tone.  Skin: Skin is warm and dry.  Psychiatric: She has a normal mood and affect. Her behavior is normal.          Assessment & Plan:  Impression #1 wellness exam. #2 chronic back pain secondary to degenerative disc disease. #3 hypertension stable off meds. #4 estrogen insufficiency. Severe for this patient. She definitely wants to stay on Premarin despite risks. Plan diet exercise discussed in encourage. Patient will get a mammogram. Patient declines colonoscopy. Maintain same meds. WSL

## 2012-11-03 ENCOUNTER — Ambulatory Visit (INDEPENDENT_AMBULATORY_CARE_PROVIDER_SITE_OTHER): Payer: Medicare Other

## 2012-11-03 ENCOUNTER — Ambulatory Visit (INDEPENDENT_AMBULATORY_CARE_PROVIDER_SITE_OTHER): Payer: Medicare Other | Admitting: Orthopedic Surgery

## 2012-11-03 ENCOUNTER — Encounter: Payer: Self-pay | Admitting: Orthopedic Surgery

## 2012-11-03 VITALS — BP 152/77 | Ht 65.0 in | Wt 194.0 lb

## 2012-11-03 DIAGNOSIS — M25561 Pain in right knee: Secondary | ICD-10-CM | POA: Insufficient documentation

## 2012-11-03 DIAGNOSIS — M23329 Other meniscus derangements, posterior horn of medial meniscus, unspecified knee: Secondary | ICD-10-CM

## 2012-11-03 DIAGNOSIS — M25569 Pain in unspecified knee: Secondary | ICD-10-CM

## 2012-11-03 DIAGNOSIS — M5431 Sciatica, right side: Secondary | ICD-10-CM

## 2012-11-03 DIAGNOSIS — M5430 Sciatica, unspecified side: Secondary | ICD-10-CM | POA: Insufficient documentation

## 2012-11-03 DIAGNOSIS — M23321 Other meniscus derangements, posterior horn of medial meniscus, right knee: Secondary | ICD-10-CM

## 2012-11-03 MED ORDER — METHYLPREDNISOLONE (PAK) 4 MG PO TABS: ORAL_TABLET | ORAL | Status: DC

## 2012-11-03 MED ORDER — GABAPENTIN 100 MG PO CAPS: 100.0000 mg | ORAL_CAPSULE | Freq: Three times a day (TID) | ORAL | Status: DC

## 2012-11-03 NOTE — Progress Notes (Signed)
Patient ID: Kerry Richardson, female   DOB: May 19, 1940, 72 y.o.   MRN: [id number] Chief Complaint  Patient presents with  . Knee Pain    Right knee and leg pain    69 female history of left knee arthroscopy with partial medial meniscectomy history bulging discs in her lower back presents with gradual onset of posterior right leg pain which progressed a medial knee pain popping catching and inability to weight-bear. She had had pain levels as high as a 10 describes sharp stabbing discomfort which is improved when she's not walking on her leg review of systems wheezing snoring denies heartburn frequency skin changes fever  The past, family history and social history have been reviewed and are recorded in the corresponding sections of epic   Past Medical History  Diagnosis Date  . Bulging discs   . Bursitis   . Arthritis   . H/O removal of cyst     finger (uncertain of which finger)  . PONV (postoperative nausea and vomiting)   . Migraine headache   . Allergy     chronic  . Reflux   . Hypertension   . Back pain   . DDD (degenerative disc disease)   . Neck pain    Past Surgical History  Procedure Laterality Date  . Cervical fusion    . Abdominal hysterectomy    . Foot surgery    . Carpal tunnel release      bilateral  . Knee arthroscopy with medial menisectomy  03/14/2012    Procedure: KNEE ARTHROSCOPY WITH MEDIAL MENISECTOMY;  Surgeon: Vickki Hearing, MD;  Location: [facility];  Service: Orthopedics;  Laterality: Left;  . Chondroplasty  03/14/2012    Procedure: CHONDROPLASTY;  Surgeon: Vickki Hearing, MD;  Location: [facility];  Service: Orthopedics;  Laterality: Left;  medial condyle    BP 152/77  Ht 5\' 5"  (1.651 m)  Wt 194 lb (87.998 kg)  BMI 32.28 kg/m2 General appearance is normal, the patient is alert and oriented x3 with normal mood and affect. She is struggling to walk with a walker and having difficulty with weightbearing  She has tenderness in the lower back right SI  joint right gluteal area nontender in the thoracic spine thoracolumbar skin normal  Right knee joint small effusion medial joint line tenderness painless range of motion mild crepitance no instability normal motor function skin intact no rash or laceration and normal distal pulse and normal sensation with normal and equal reflexes  Knee x-ray is negative  Encounter Diagnoses  Name Primary?  . Right knee pain   . Sciatica neuralgia, right Yes  . Medial meniscus, posterior horn derangement, right     Could not take prednisone we put on a Medrol Dosepak and Neurontin for the sciatic neuralgia continue walker return 1 week reassess knee and back

## 2012-11-03 NOTE — Patient Instructions (Signed)
Pick up medications at pharmacy.

## 2012-11-10 ENCOUNTER — Encounter: Payer: Self-pay | Admitting: Orthopedic Surgery

## 2012-11-10 ENCOUNTER — Ambulatory Visit (INDEPENDENT_AMBULATORY_CARE_PROVIDER_SITE_OTHER): Payer: Medicare Other | Admitting: Orthopedic Surgery

## 2012-11-10 VITALS — BP 143/68 | Ht 65.0 in | Wt 194.0 lb

## 2012-11-10 DIAGNOSIS — M25561 Pain in right knee: Secondary | ICD-10-CM

## 2012-11-10 DIAGNOSIS — M5431 Sciatica, right side: Secondary | ICD-10-CM

## 2012-11-10 NOTE — Progress Notes (Signed)
Patient ID: Kerry Richardson, female   DOB: Jun 12, 1940, 72 y.o.   MRN: [id number]  Chief Complaint  Patient presents with  . Follow-up    recheck back sciatica s/p dose pack    Recheck back and right knee status post Dosepak and gabapentin patient is doing much better able to weight-bear she wants to get would've her walker she is having some upper extremity ulnar nerve symptoms which I think are coming from using the walker.  Her knee is better she can bear weight her sciatic pain is better with the 20th pain in her lower back  Review of systems negative  She is ambulatory with a walker much more ability to weight-bear knee looks good today range of motion is good stability is good motor exam is good skin is intact pulses good sensation is normal he still has some medial joint line tenderness  At this point she is better we will continue with gabapentin we will see her in 3 weeks to see how the knee is doing we can always resume the Medrol Dosepak if needed.  Diagnosis sciatica Knee pain presumed osteoarthritis possible meniscal tear

## 2012-11-10 NOTE — Patient Instructions (Addendum)
Continue gabapentin 100 mg 3 times day  Ok to stop using walker

## 2012-12-01 ENCOUNTER — Ambulatory Visit (INDEPENDENT_AMBULATORY_CARE_PROVIDER_SITE_OTHER): Payer: Medicare Other | Admitting: Orthopedic Surgery

## 2012-12-01 ENCOUNTER — Encounter: Payer: Self-pay | Admitting: Orthopedic Surgery

## 2012-12-01 VITALS — BP 141/79 | Ht 65.0 in | Wt 194.0 lb

## 2012-12-01 DIAGNOSIS — M1710 Unilateral primary osteoarthritis, unspecified knee: Secondary | ICD-10-CM

## 2012-12-01 DIAGNOSIS — IMO0002 Reserved for concepts with insufficient information to code with codable children: Secondary | ICD-10-CM

## 2012-12-01 DIAGNOSIS — M23321 Other meniscus derangements, posterior horn of medial meniscus, right knee: Secondary | ICD-10-CM

## 2012-12-01 DIAGNOSIS — M4800 Spinal stenosis, site unspecified: Secondary | ICD-10-CM

## 2012-12-01 DIAGNOSIS — M23329 Other meniscus derangements, posterior horn of medial meniscus, unspecified knee: Secondary | ICD-10-CM

## 2012-12-01 DIAGNOSIS — M5126 Other intervertebral disc displacement, lumbar region: Secondary | ICD-10-CM

## 2012-12-01 NOTE — Patient Instructions (Signed)
Take medication and call after eye surgery

## 2012-12-01 NOTE — Progress Notes (Signed)
Patient ID: Kerry Richardson, female   DOB: 1940/12/13, 72 y.o.   MRN: [id number]  Chief Complaint  Patient presents with  . Follow-up    Recheck right knee   Status post treatment for sciatica with underlying torn medial meniscus and osteoarthritis. The patient has have eye surgery and cannot have knee surgery at this time she has several birthdays with her grandchildren and she doesn't want to miss him  Review of systems glaucoma right eye with vision disturbance Mild right radicular symptoms improved Continue medial knee pain  BP 141/79  Ht 5\' 5"  (1.651 m)  Wt 194 lb (87.998 kg)  BMI 32.28 kg/m2 Ambulation has been improved and continues to improve she has medial joint line tenderness with full range of motion normal stability strength and skin good pulse positive McMurray sign.  Encounter Diagnoses  Name Primary?  Marland Kitchen SPINAL STENOSIS Yes  . DEGENERATIVE DISC DISEASE, LUMBOSACRAL SPINE W/RADICULOPATHY   . Osteoarthritis, knee   . Medial meniscus, posterior horn derangement, right     She will call us if she is ready to have 4 when she is rated have arthroscopically right knee for medial meniscal tear

## 2012-12-02 ENCOUNTER — Telehealth: Payer: Self-pay | Admitting: Orthopedic Surgery

## 2012-12-02 NOTE — Telephone Encounter (Signed)
Received call back from patient regarding scheduling surgery, per office visit yesterday, 12/01/12.  She is requesting surgery date: Monday, 12/12/12, for procedure   "she is rated have arthroscopically right knee for medial meniscal tear"   Her phone # is [phone].  Please advise.

## 2012-12-05 ENCOUNTER — Other Ambulatory Visit: Payer: Self-pay | Admitting: *Deleted

## 2012-12-05 NOTE — Telephone Encounter (Signed)
     Received call back from patient regarding scheduling surgery, per office visit yesterday, 12/01/12. She is requesting surgery date: Monday, 12/12/12, for procedure     "she is rated have arthroscopically right knee for medial meniscal tear"         Surgery elective surgeries are done on Fridays so if she wants it done the 29th we can do a the 29th

## 2012-12-05 NOTE — Telephone Encounter (Signed)
Error No Note

## 2012-12-05 NOTE — Telephone Encounter (Signed)
Dr. Romeo Apple is it ok to schedule this for Monday 12/12/12 at 1pm? According to your schedule now,it looks like you have am office and 1 patient at 3pm.

## 2012-12-06 ENCOUNTER — Other Ambulatory Visit: Payer: Self-pay | Admitting: *Deleted

## 2012-12-06 NOTE — Telephone Encounter (Signed)
Talked with patient and scheduled surgery for 12/23/12

## 2012-12-09 ENCOUNTER — Other Ambulatory Visit (HOSPITAL_COMMUNITY): Payer: Medicare Other

## 2012-12-12 ENCOUNTER — Telehealth: Payer: Self-pay | Admitting: Orthopedic Surgery

## 2012-12-12 NOTE — Telephone Encounter (Signed)
Called [employer], ph# [phone], regarding out-patient surgery scheduled January 06, 2013, CPT codes 56433, 29880, ICD9 codes 715.96, 717.2; per [place], no pre-authorization required.  Her name and today's date, 12/12/12, 4:21 p.m, for reference.

## 2012-12-16 ENCOUNTER — Encounter (HOSPITAL_COMMUNITY)
Admission: RE | Admit: 2012-12-16 | Discharge: 2012-12-16 | Disposition: A | Discharge status: Home / Self Care | Payer: Medicare Other | Source: Ambulatory Visit | Attending: Orthopedic Surgery | Admitting: Orthopedic Surgery

## 2012-12-16 ENCOUNTER — Encounter (HOSPITAL_COMMUNITY): Payer: Self-pay

## 2012-12-16 DIAGNOSIS — Z01812 Encounter for preprocedural laboratory examination: Secondary | ICD-10-CM | POA: Insufficient documentation

## 2012-12-16 DIAGNOSIS — Z01818 Encounter for other preprocedural examination: Secondary | ICD-10-CM | POA: Insufficient documentation

## 2012-12-16 HISTORY — DX: Gastro-esophageal reflux disease without esophagitis: K21.9

## 2012-12-16 HISTORY — DX: Unspecified glaucoma: H40.9

## 2012-12-16 LAB — BASIC METABOLIC PANEL
BUN: 16 mg/dL (ref 6–23)
CO2: 29 mEq/L (ref 19–32)
Chloride: 102 mEq/L (ref 96–112)
Glucose, Bld: 109 mg/dL — ABNORMAL HIGH (ref 70–99)
Potassium: 4.4 mEq/L (ref 3.5–5.1)
Sodium: 140 mEq/L (ref 135–145)

## 2012-12-16 NOTE — Patient Instructions (Addendum)
QUINTAVIA ROGSTAD  12/16/2012   Your procedure is scheduled on:  12/23/2012  Report to [facility] at  745  AM.  Call this number if you have problems the morning of surgery: [phone]   Remember:   Do not eat food or drink liquids after midnight.   Take these medicines the morning of surgery with A SIP OF WATER: neurontin, medrol   Do not wear jewelry, make-up or nail polish.  Do not wear lotions, powders, or perfumes.   Do not shave 48 hours prior to surgery. Men may shave face and neck.  Do not bring valuables to the hospital.  [facility] is not responsible for any belongings or valuables.               Contacts, dentures or bridgework may not be worn into surgery.  Leave suitcase in the car. After surgery it may be brought to your room.  For patients admitted to the hospital, discharge time is determined by your treatment team.               Patients discharged the day of surgery will not be allowed to drive home.  Name and phone number of your driver: family  Special Instructions: Shower using CHG 2 nights before surgery and the night before surgery.  If you shower the day of surgery use CHG.  Use special wash - you have one bottle of CHG for all showers.  You should use approximately 1/3 of the bottle for each shower.   Please read over the following fact sheets that you were given: Pain Booklet, Coughing and Deep Breathing, Surgical Site Infection Prevention, Anesthesia Post-op Instructions and Care and Recovery After Surgery Arthroscopic Procedure, Knee An arthroscopic procedure can find what is wrong with your knee. PROCEDURE Arthroscopy is a surgical technique that allows your orthopedic surgeon to diagnose and treat your knee injury with accuracy. They will look into your knee through a small instrument. This is almost like a small (pencil sized) telescope. Because arthroscopy affects your knee less than open knee surgery, you can anticipate a more rapid recovery. Taking an  active role by following your caregiver's instructions will help with rapid and complete recovery. Use crutches, rest, elevation, ice, and knee exercises as instructed. The length of recovery depends on various factors including type of injury, age, physical condition, medical conditions, and your rehabilitation. Your knee is the joint between the large bones (femur and tibia) in your leg. Cartilage covers these bone ends which are smooth and slippery and allow your knee to bend and move smoothly. Two menisci, thick, semi-lunar shaped pads of cartilage which form a rim inside the joint, help absorb shock and stabilize your knee. Ligaments bind the bones together and support your knee joint. Muscles move the joint, help support your knee, and take stress off the joint itself. Because of this all programs and physical therapy to rehabilitate an injured or repaired knee require rebuilding and strengthening your muscles. AFTER THE PROCEDURE  After the procedure, you will be moved to a recovery area until most of the effects of the medication have worn off. Your caregiver will discuss the test results with you.  Only take over-the-counter or prescription medicines for pain, discomfort, or fever as directed by your caregiver. SEEK MEDICAL CARE IF:   You have increased bleeding from your wounds.  You see redness, swelling, or have increasing pain in your wounds.  You have pus coming from your wound.  You have an oral temperature above 102 F (38.9 C).  You notice a bad smell coming from the wound or dressing.  You have severe pain with any motion of your knee. SEEK IMMEDIATE MEDICAL CARE IF:   You develop a rash.  You have difficulty breathing.  You have any allergic problems. Document Released: 03/06/2000 Document Revised: 06/01/2011 Document Reviewed: 09/28/2007 [facility] Patient Information 2014 [place], [place]. PATIENT INSTRUCTIONS POST-ANESTHESIA  IMMEDIATELY FOLLOWING SURGERY:  Do not  drive or operate machinery for the first twenty four hours after surgery.  Do not make any important decisions for twenty four hours after surgery or while taking narcotic pain medications or sedatives.  If you develop intractable nausea and vomiting or a severe headache please notify your doctor immediately.  FOLLOW-UP:  Please make an appointment with your surgeon as instructed. You do not need to follow up with anesthesia unless specifically instructed to do so.  WOUND CARE INSTRUCTIONS (if applicable):  Keep a dry clean dressing on the anesthesia/puncture wound site if there is drainage.  Once the wound has quit draining you may leave it open to air.  Generally you should leave the bandage intact for twenty four hours unless there is drainage.  If the epidural site drains for more than 36-48 hours please call the anesthesia department.  QUESTIONS?:  Please feel free to call your physician or the hospital operator if you have any questions, and they will be happy to assist you.

## 2012-12-22 NOTE — H&P (Signed)
Kerry Richardson is an 72 y.o. female.   Chief Complaint: Right knee pain  HPI: 72 year-old female with continued medial pain in her right knee associated with some mechanical symptoms for the last 6-8 weeks. She also has had some sciatica and right leg pain from that but we have isolated her pain to the medial joint line she has some arthritis on x-ray or mechanical symptoms have revealed that she has a medial meniscal tear. She is interested in further surgical management after nonoperative treatment has failed to relieve her symptoms and return her to normal function. She is really struggling at this time with the knee. She will undergo arthroscopic evaluation and partial medial meniscectomy    Past Medical History  Diagnosis Date  . Bulging discs   . Bursitis   . Arthritis   . H/O removal of cyst     finger (uncertain of which finger)  . PONV (postoperative nausea and vomiting)   . Migraine headache   . Allergy     chronic  . Reflux   . Back pain   . DDD (degenerative disc disease)   . Neck pain   . GERD (gastroesophageal reflux disease)   . Glaucoma     Past Surgical History  Procedure Laterality Date  . Cervical fusion    . Abdominal hysterectomy    . Foot surgery    . Carpal tunnel release      bilateral  . Knee arthroscopy with medial menisectomy  03/14/2012    Procedure: KNEE ARTHROSCOPY WITH MEDIAL MENISECTOMY;  Surgeon: Vickki Hearing, MD;  Location: [facility];  Service: Orthopedics;  Laterality: Left;  . Chondroplasty  03/14/2012    Procedure: CHONDROPLASTY;  Surgeon: Vickki Hearing, MD;  Location: [facility];  Service: Orthopedics;  Laterality: Left;  medial condyle    Family History  Problem Relation Age of Onset  . Hypertension Mother     borderline   Social History:  reports that she has never smoked. She does not have any smokeless tobacco history on file. She reports that she does not drink alcohol or use illicit drugs.  Allergies:  Allergies  Allergen  Reactions  . Codeine Nausea Only    No prescriptions prior to admission    No results found for this or any previous visit (from the past 48 hour(s)). No results found.  ROS  There were no vitals taken for this visit.  Review of systems glaucoma right eye with vision disturbance Mild right radicular symptoms improved Continue medial knee pain  Physical Exam  Vital signs are stable she is oriented x3 mood and affect are normal gait is occasionally antalgic favoring the right side. Her appearance is normal.  Upper extremity exam  The right and left upper extremity:   Inspection and palpation revealed no abnormalities in the upper extremities.   Range of motion is full without contracture.  Motor exam is normal with grade 5 strength.  The joints are fully reduced without subluxation.  there is no atrophy or tremor and muscle tone is normal.  All joints are stable.  Left Lower extremity exam  Inspection and palpation revealed no tenderness or abnormality in alignment in the lower extremities. Range of motion is full.  Strength is grade 5.  All joints are stable.  Inspection of the right knee reveals continued medial joint line tenderness with full range of motion normal stability and strength normal skin normal pulse with a positive McMurray sign Assessment/Plan Medial meniscus tear  right knee   Arthroscopy right knee with medial menisectomy   Fuller Canada 12/22/2012, 9:17 AM

## 2012-12-23 ENCOUNTER — Encounter (HOSPITAL_COMMUNITY)
Admission: RE | Disposition: A | Discharge status: Home / Self Care | Payer: Self-pay | Source: Ambulatory Visit | Attending: Orthopedic Surgery

## 2012-12-23 ENCOUNTER — Encounter (HOSPITAL_COMMUNITY): Payer: Self-pay | Admitting: Anesthesiology

## 2012-12-23 ENCOUNTER — Ambulatory Visit (HOSPITAL_COMMUNITY)
Admission: RE | Admit: 2012-12-23 | Discharge: 2012-12-23 | Disposition: A | Discharge status: Home / Self Care | Payer: Medicare Other | Source: Ambulatory Visit | Attending: Orthopedic Surgery | Admitting: Orthopedic Surgery

## 2012-12-23 ENCOUNTER — Encounter (HOSPITAL_COMMUNITY): Payer: Self-pay | Admitting: *Deleted

## 2012-12-23 ENCOUNTER — Ambulatory Visit (HOSPITAL_COMMUNITY): Payer: Medicare Other | Admitting: Anesthesiology

## 2012-12-23 DIAGNOSIS — M2240 Chondromalacia patellae, unspecified knee: Secondary | ICD-10-CM | POA: Insufficient documentation

## 2012-12-23 DIAGNOSIS — M898X9 Other specified disorders of bone, unspecified site: Secondary | ICD-10-CM | POA: Insufficient documentation

## 2012-12-23 DIAGNOSIS — IMO0002 Reserved for concepts with insufficient information to code with codable children: Secondary | ICD-10-CM

## 2012-12-23 DIAGNOSIS — M1710 Unilateral primary osteoarthritis, unspecified knee: Secondary | ICD-10-CM

## 2012-12-23 DIAGNOSIS — M6580 Other synovitis and tenosynovitis, unspecified site: Secondary | ICD-10-CM | POA: Insufficient documentation

## 2012-12-23 HISTORY — PX: CHONDROPLASTY: SHX5177

## 2012-12-23 SURGERY — CHONDROPLASTY
Anesthesia: General | Site: Knee | Laterality: Right | Wound class: Clean

## 2012-12-23 MED ORDER — SODIUM CHLORIDE 0.9 % IR SOLN
Status: DC | PRN
  Administered 2012-12-23 (×2)

## 2012-12-23 MED ORDER — PROPOFOL 10 MG/ML IV BOLUS
INTRAVENOUS | Status: DC | PRN
  Administered 2012-12-23: 130 mg via INTRAVENOUS

## 2012-12-23 MED ORDER — BUPIVACAINE-EPINEPHRINE PF 0.5-1:200000 % IJ SOLN
INTRAMUSCULAR | Status: AC
  Filled 2012-12-23: qty 20

## 2012-12-23 MED ORDER — LIDOCAINE HCL (CARDIAC) 20 MG/ML IV SOLN
INTRAVENOUS | Status: DC | PRN
  Administered 2012-12-23: 50 mg via INTRAVENOUS

## 2012-12-23 MED ORDER — LACTATED RINGERS IV SOLN
INTRAVENOUS | Status: DC | PRN
  Administered 2012-12-23: 08:00:00 via INTRAVENOUS

## 2012-12-23 MED ORDER — BUPIVACAINE-EPINEPHRINE PF 0.5-1:200000 % IJ SOLN
INTRAMUSCULAR | Status: DC | PRN
  Administered 2012-12-23: 60 mL

## 2012-12-23 MED ORDER — ONDANSETRON HCL 4 MG/2ML IJ SOLN: 4.0000 mg | Freq: Once | INTRAMUSCULAR | Status: DC | PRN

## 2012-12-23 MED ORDER — DEXAMETHASONE SODIUM PHOSPHATE 4 MG/ML IJ SOLN
INTRAMUSCULAR | Status: AC
  Filled 2012-12-23: qty 1

## 2012-12-23 MED ORDER — FENTANYL CITRATE 0.05 MG/ML IJ SOLN
INTRAMUSCULAR | Status: AC
  Filled 2012-12-23: qty 2

## 2012-12-23 MED ORDER — ONDANSETRON HCL 4 MG/2ML IJ SOLN
INTRAMUSCULAR | Status: AC
  Filled 2012-12-23: qty 2

## 2012-12-23 MED ORDER — MIDAZOLAM HCL 2 MG/2ML IJ SOLN
INTRAMUSCULAR | Status: AC
  Filled 2012-12-23: qty 2

## 2012-12-23 MED ORDER — LACTATED RINGERS IV SOLN
INTRAVENOUS | Status: DC
  Administered 2012-12-23: 1000 mL via INTRAVENOUS

## 2012-12-23 MED ORDER — HYDROCODONE-ACETAMINOPHEN 5-325 MG PO TABS: 1.0000 | ORAL_TABLET | ORAL | Status: DC | PRN

## 2012-12-23 MED ORDER — MIDAZOLAM HCL 2 MG/2ML IJ SOLN
1.0000 mg | INTRAMUSCULAR | Status: DC | PRN
  Administered 2012-12-23: 2 mg via INTRAVENOUS

## 2012-12-23 MED ORDER — ONDANSETRON HCL 4 MG/2ML IJ SOLN
4.0000 mg | Freq: Once | INTRAMUSCULAR | Status: AC
  Administered 2012-12-23: 4 mg via INTRAVENOUS

## 2012-12-23 MED ORDER — CEFAZOLIN SODIUM-DEXTROSE 2-3 GM-% IV SOLR: 2.0000 g | INTRAVENOUS | Status: DC

## 2012-12-23 MED ORDER — FENTANYL CITRATE 0.05 MG/ML IJ SOLN
INTRAMUSCULAR | Status: DC | PRN
  Administered 2012-12-23 (×2): 25 ug via INTRAVENOUS

## 2012-12-23 MED ORDER — PHENYLEPHRINE HCL 10 MG/ML IJ SOLN
INTRAMUSCULAR | Status: AC
  Filled 2012-12-23: qty 1

## 2012-12-23 MED ORDER — SODIUM CHLORIDE 0.9 % IR SOLN
Status: DC | PRN
  Administered 2012-12-23: 1000 mL

## 2012-12-23 MED ORDER — FENTANYL CITRATE 0.05 MG/ML IJ SOLN
25.0000 ug | INTRAMUSCULAR | Status: AC
  Administered 2012-12-23 (×2): 25 ug via INTRAVENOUS

## 2012-12-23 MED ORDER — LIDOCAINE HCL (PF) 1 % IJ SOLN
INTRAMUSCULAR | Status: AC
  Filled 2012-12-23: qty 5

## 2012-12-23 MED ORDER — CEFAZOLIN SODIUM 1-5 GM-% IV SOLN
1.0000 g | INTRAVENOUS | Status: AC
  Administered 2012-12-23: 2 g via INTRAVENOUS
  Filled 2012-12-23 (×2): qty 50

## 2012-12-23 MED ORDER — EPINEPHRINE HCL 1 MG/ML IJ SOLN
INTRAMUSCULAR | Status: AC
  Filled 2012-12-23: qty 5

## 2012-12-23 MED ORDER — FENTANYL CITRATE 0.05 MG/ML IJ SOLN
25.0000 ug | INTRAMUSCULAR | Status: DC | PRN
  Administered 2012-12-23 (×2): 50 ug via INTRAVENOUS
  Filled 2012-12-23: qty 2

## 2012-12-23 MED ORDER — CHLORHEXIDINE GLUCONATE 4 % EX LIQD: 60.0000 mL | Freq: Once | CUTANEOUS | Status: DC

## 2012-12-23 MED ORDER — PROPOFOL 10 MG/ML IV EMUL
INTRAVENOUS | Status: AC
  Filled 2012-12-23: qty 20

## 2012-12-23 MED ORDER — DEXAMETHASONE SODIUM PHOSPHATE 4 MG/ML IJ SOLN
4.0000 mg | Freq: Once | INTRAMUSCULAR | Status: AC
  Administered 2012-12-23: 4 mg via INTRAVENOUS

## 2012-12-23 MED ORDER — PROMETHAZINE HCL 12.5 MG PO TABS: 60.0000 mg | ORAL_TABLET | ORAL | Status: DC | PRN

## 2012-12-23 SURGICAL SUPPLY — 41 items
BAG HAMPER (MISCELLANEOUS) ×3 IMPLANT
BANDAGE ELASTIC 6 VELCRO NS (GAUZE/BANDAGES/DRESSINGS) ×3 IMPLANT
BLADE AGGRESSIVE PLUS 4.0 (BLADE) ×3 IMPLANT
BLADE SURG SZ11 CARB STEEL (BLADE) ×3 IMPLANT
CHLORAPREP W/TINT 26ML (MISCELLANEOUS) ×6 IMPLANT
CLOTH BEACON ORANGE TIMEOUT ST (SAFETY) ×3 IMPLANT
COOLER CRYO IC GRAV AND TUBE (ORTHOPEDIC SUPPLIES) ×3 IMPLANT
CUFF CRYO KNEE18X23 MED (MISCELLANEOUS) ×2 IMPLANT
CUFF TOURNIQUET SINGLE 34IN LL (TOURNIQUET CUFF) ×2 IMPLANT
DECANTER SPIKE VIAL GLASS SM (MISCELLANEOUS) ×6 IMPLANT
GAUZE SPONGE 4X4 16PLY XRAY LF (GAUZE/BANDAGES/DRESSINGS) ×3 IMPLANT
GAUZE XEROFORM 5X9 LF (GAUZE/BANDAGES/DRESSINGS) ×3 IMPLANT
GLOVE SKINSENSE NS SZ8.0 LF (GLOVE) ×1
GLOVE SKINSENSE STRL SZ8.0 LF (GLOVE) ×2 IMPLANT
GLOVE SS N UNI LF 8.5 STRL (GLOVE) ×3 IMPLANT
GOWN STRL REIN XL XLG (GOWN DISPOSABLE) ×7 IMPLANT
HLDR LEG FOAM (MISCELLANEOUS) ×2 IMPLANT
IV NS IRRIG 3000ML ARTHROMATIC (IV SOLUTION) ×8 IMPLANT
KIT BLADEGUARD II DBL (SET/KITS/TRAYS/PACK) ×3 IMPLANT
KIT ROOM TURNOVER AP CYSTO (KITS) ×3 IMPLANT
LEG HOLDER FOAM (MISCELLANEOUS) ×1
MANIFOLD NEPTUNE II (INSTRUMENTS) ×3 IMPLANT
MARKER SKIN DUAL TIP RULER LAB (MISCELLANEOUS) ×3 IMPLANT
NDL HYPO 18GX1.5 BLUNT FILL (NEEDLE) ×1 IMPLANT
NDL SPNL 18GX3.5 QUINCKE PK (NEEDLE) ×1 IMPLANT
NEEDLE HYPO 18GX1.5 BLUNT FILL (NEEDLE) ×3 IMPLANT
NEEDLE HYPO 21X1.5 SAFETY (NEEDLE) ×3 IMPLANT
NEEDLE SPNL 18GX3.5 QUINCKE PK (NEEDLE) ×3 IMPLANT
NS IRRIG 1000ML POUR BTL (IV SOLUTION) ×3 IMPLANT
PACK ARTHRO LIMB DRAPE STRL (MISCELLANEOUS) ×3 IMPLANT
PAD ABD 5X9 TENDERSORB (GAUZE/BANDAGES/DRESSINGS) ×3 IMPLANT
PAD ARMBOARD 7.5X6 YLW CONV (MISCELLANEOUS) ×3 IMPLANT
PADDING CAST COTTON 6X4 STRL (CAST SUPPLIES) ×3 IMPLANT
SET ARTHROSCOPY INST (INSTRUMENTS) ×3 IMPLANT
SET ARTHROSCOPY PUMP TUBE (IRRIGATION / IRRIGATOR) ×3 IMPLANT
SET BASIN LINEN APH (SET/KITS/TRAYS/PACK) ×3 IMPLANT
SPONGE GAUZE 4X4 12PLY (GAUZE/BANDAGES/DRESSINGS) ×3 IMPLANT
SUT ETHILON 3 0 FSL (SUTURE) ×2 IMPLANT
SYR 30ML LL (SYRINGE) ×3 IMPLANT
SYRINGE 10CC LL (SYRINGE) ×3 IMPLANT
YANKAUER SUCT BULB TIP 10FT TU (MISCELLANEOUS) ×9 IMPLANT

## 2012-12-23 NOTE — Op Note (Signed)
12/23/2012  9:56 AM  PATIENT:  Kerry Richardson  72 y.o. 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 Synovitis moderate.  Details of procedure: The patient was identified in the preoperative holding area and the site was confirmed and marked. Chart review was completed. Patient taken to the operating room given appropriate about space on her weight of 89 kg. In the supine position the patient was intubated and without complication. In the supine position the right leg was placed in an arthroscopic leg holder and the left leg was padded and placed in a well leg holder.  Sterile prep and drape was completed. Timeout was completed. Site was confirmed. Surgery confirmed patient identification confirmed.  The lateral portal was injected with Marcaine and epinephrine solution as was the medial portal, A lateral portal was established with an 11 blade and the scope was placed into the joint. A diagnostic arthroscopy was performed. Arthritis was noted in the medial compartment and patellofemoral compartment a moderate amount of synovitis  The lateral compartment was intact. Anterior cruciate ligament and PCL were normal  Medial portal a probe was placed in the joint and the meniscus an intra-articular structures were palpated. Articular surfaces were palpated as well.  A chondroplasty was performed using a combination of arthroscopic shaver an arthroscopic rasp. The knee was irrigated and the wash mode several times to clear the knee of joint debris. Synovector me was performed as well primarily in  the medial compartment patellofemoral compartment and anterior compartment of the knee.  The portals were closed with 3-0 nylon sutures and prior portal closure the knee was injected with 60 cc of Marcaine with epinephrine.  Sterile dressings and Cryo/Cuff were applied Cryo/Cuff was activated. Patient was extubated taken recovery in stable condition  SURGEON:  Surgeon(s) and Role:    * Vickki Hearing, MD - Primary  PHYSICIAN ASSISTANT:   ASSISTANTS: none   ANESTHESIA:   general  EBL:  Total I/O In: 500 [I.V.:500] Out: -   BLOOD ADMINISTERED:none  DRAINS: none   LOCAL MEDICATIONS USED:  MARCAINE    and Amount: 60 ml  SPECIMEN:  No Specimen  DISPOSITION OF SPECIMEN:  N/A  COUNTS:  YES  TOURNIQUET:    DICTATION: .Dragon Dictation  PLAN OF CARE: Discharge to home after PACU  PATIENT DISPOSITION:  PACU - hemodynamically stable.   Delay start of Pharmacological VTE agent (>24hrs) due to surgical blood loss or risk of bleeding: not applicable  The postoperative plan for this patient is to see her on Monday to remove her sutures. She is weightbearing as tolerated. We will further address her lumbar spine issues which are contributing to her knee pain and knee weakness.  She can start physical therapy next week. She'll need treatment for arthritis with medical management, weight loss, oral cartilage supplementation.

## 2012-12-23 NOTE — Transfer of Care (Signed)
Immediate Anesthesia Transfer of Care Note  Patient: Kerry Richardson  Procedure(s) Performed: Procedure(s): CHONDROPLASTY WITH LIMITED DEBRIDMENT (Right)  Patient Location: PACU  Anesthesia Type:General  Level of Consciousness: awake, alert , oriented and patient cooperative  Airway & Oxygen Therapy: Patient Spontanous Breathing and Patient connected to face mask oxygen  Post-op Assessment: Report given to PACU RN and Post -op Vital signs reviewed and stable  Post vital signs: Reviewed and stable  Complications: No apparent anesthesia complications

## 2012-12-23 NOTE — Brief Op Note (Signed)
12/23/2012  9:56 AM  PATIENT:  Kerry Richardson  72 y.o. 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 Synovitis moderate.  Details of procedure: The patient was identified in the preoperative holding area and the site was confirmed and marked. Chart review was completed. Patient taken to the operating room given appropriate about space on her weight of 89 kg. In the supine position the patient was intubated and without complication. In the supine position the right leg was placed in an arthroscopic leg holder and the left leg was padded and placed in a well leg holder.  Sterile prep and drape was completed. Timeout was completed. Site was confirmed. Surgery confirmed patient identification confirmed.  The lateral portal was injected with Marcaine and epinephrine solution as was the medial portal, A lateral portal was established with an 11 blade and the scope was placed into the joint. A diagnostic arthroscopy was performed. Arthritis was noted in the medial compartment and patellofemoral compartment a moderate amount of synovitis  The lateral compartment was intact. Anterior cruciate ligament and PCL were normal  Medial portal a probe was placed in the joint and the meniscus an intra-articular structures were palpated. Articular surfaces were palpated as well.  A chondroplasty was performed using a combination of arthroscopic shaver an arthroscopic rasp. The knee was irrigated and the wash mode several times to clear the knee of joint debris. Synovector me was performed as well primarily in  the medial compartment patellofemoral compartment and anterior compartment of the knee.  The portals were closed with 3-0 nylon sutures and prior portal closure the knee was injected with 60 cc of Marcaine with epinephrine.  Sterile dressings and Cryo/Cuff were applied Cryo/Cuff was activated. Patient was extubated taken recovery in stable condition  SURGEON:  Surgeon(s) and Role:    * Stanley E Harrison, MD - Primary  PHYSICIAN ASSISTANT:   ASSISTANTS: none   ANESTHESIA:   general  EBL:  Total I/O In: 500 [I.V.:500] Out: -   BLOOD ADMINISTERED:none  DRAINS: none   LOCAL MEDICATIONS USED:  MARCAINE    and Amount: 60 ml  SPECIMEN:  No Specimen  DISPOSITION OF SPECIMEN:  N/A  COUNTS:  YES  TOURNIQUET:    DICTATION: .Dragon Dictation  PLAN OF CARE: Discharge to home after PACU  PATIENT DISPOSITION:  PACU - hemodynamically stable.   Delay start of Pharmacological VTE agent (>24hrs) due to surgical blood loss or risk of bleeding: not applicable  The postoperative plan for this patient is to see her on Monday to remove her sutures. She is weightbearing as tolerated. We will further address her lumbar spine issues which are contributing to her knee pain and knee weakness.  She can start physical therapy next week. She'll need treatment for arthritis with medical management, weight loss, oral cartilage supplementation.  

## 2012-12-23 NOTE — Anesthesia Preprocedure Evaluation (Addendum)
Anesthesia Evaluation  Patient identified by MRN, date of birth, ID band Patient awake    Reviewed: Allergy & Precautions, H&P , NPO status , Patient's Chart, lab work & pertinent test results  History of Anesthesia Complications (+) PONV  Airway Mallampati: I TM Distance: >3 FB Neck ROM: Limited    Dental  (+) Teeth Intact   Pulmonary neg pulmonary ROS,  breath sounds clear to auscultation        Cardiovascular negative cardio ROS  Rhythm:Regular Rate:Normal     Neuro/Psych    GI/Hepatic negative GI ROS,   Endo/Other    Renal/GU      Musculoskeletal  (+) Arthritis - (spinal stenosis, chronic LBP, cervical fusion), Osteoarthritis,    Abdominal   Peds  Hematology   Anesthesia Other Findings   Reproductive/Obstetrics                           Anesthesia Physical Anesthesia Plan  ASA: II  Anesthesia Plan: General   Post-op Pain Management:    Induction: Intravenous  Airway Management Planned: LMA  Additional Equipment:   Intra-op Plan:   Post-operative Plan: Extubation in OR  Informed Consent: I have reviewed the patients History and Physical, chart, labs and discussed the procedure including the risks, benefits and alternatives for the proposed anesthesia with the patient or authorized representative who has indicated his/her understanding and acceptance.     Plan Discussed with:   Anesthesia Plan Comments:         Anesthesia Quick Evaluation

## 2012-12-23 NOTE — Anesthesia Postprocedure Evaluation (Signed)
  Anesthesia Post-op Note  Patient: LOUSIE CALICO  Procedure(s) Performed: Procedure(s): CHONDROPLASTY WITH LIMITED DEBRIDMENT (Right)  Patient Location: PACU  Anesthesia Type:General  Level of Consciousness: awake, alert , oriented and patient cooperative  Airway and Oxygen Therapy: Patient Spontanous Breathing and Patient connected to face mask oxygen  Post-op Pain: none  Post-op Assessment: Post-op Vital signs reviewed, Patient's Cardiovascular Status Stable, Respiratory Function Stable, Patent Airway, No signs of Nausea or vomiting and Pain level controlled  Post-op Vital Signs: Reviewed and stable  Complications: No apparent anesthesia complications

## 2012-12-23 NOTE — Preoperative (Signed)
Beta Blockers   Reason not to administer Beta Blockers:Not Applicable 

## 2012-12-23 NOTE — Anesthesia Procedure Notes (Signed)
Procedure Name: LMA Insertion Date/Time: 12/23/2012 9:17 AM Performed by: Carolyne Littles, Sheryn Aldaz L Pre-anesthesia Checklist: Patient identified, Timeout performed, Emergency Drugs available, Suction available and Patient being monitored Patient Re-evaluated:Patient Re-evaluated prior to inductionOxygen Delivery Method: Circle system utilized Preoxygenation: Pre-oxygenation with 100% oxygen Intubation Type: IV induction Ventilation: Mask ventilation without difficulty LMA: LMA inserted LMA Size: 4.0 Number of attempts: 1 Placement Confirmation: positive ETCO2 and breath sounds checked- equal and bilateral Tube secured with: Tape Dental Injury: Teeth and Oropharynx as per pre-operative assessment

## 2012-12-23 NOTE — Interval H&P Note (Signed)
History and Physical Interval Note:  12/23/2012 9:05 AM  Kerry Richardson  has presented today for surgery, with the diagnosis of Right medial meniscal tear  The various methods of treatment have been discussed with the patient and family. After consideration of risks, benefits and other options for treatment, the patient has consented to  Procedure(s): KNEE ARTHROSCOPY WITH MEDIAL MENISECTOMY (Right) as a surgical intervention .  The patient's history has been reviewed, patient examined, no change in status, stable for surgery.  I have reviewed the patient's chart and labs.  Questions were answered to the patient's satisfaction.     Fuller Canada

## 2012-12-26 ENCOUNTER — Encounter (HOSPITAL_COMMUNITY): Payer: Self-pay | Admitting: Orthopedic Surgery

## 2012-12-26 ENCOUNTER — Ambulatory Visit (INDEPENDENT_AMBULATORY_CARE_PROVIDER_SITE_OTHER): Payer: Self-pay | Admitting: Orthopedic Surgery

## 2012-12-26 VITALS — BP 153/80 | Ht 65.0 in | Wt 194.0 lb

## 2012-12-26 DIAGNOSIS — Z9889 Other specified postprocedural states: Secondary | ICD-10-CM

## 2012-12-26 DIAGNOSIS — M5430 Sciatica, unspecified side: Secondary | ICD-10-CM

## 2012-12-26 DIAGNOSIS — M5431 Sciatica, right side: Secondary | ICD-10-CM

## 2012-12-26 MED ORDER — GABAPENTIN 100 MG PO CAPS: 100.0000 mg | ORAL_CAPSULE | Freq: Three times a day (TID) | ORAL | Status: DC

## 2012-12-26 NOTE — Progress Notes (Signed)
Patient ID: Kerry Richardson, female   DOB: 10/17/1940, 72 y.o.   MRN: [id number]  Chief Complaint  Patient presents with  . Follow-up    Post op 1 SARK DOS 12/23/12    Status post arthroscopy right knee with debridement limited synovectomy and chondroplasty medial femoral condyle  The surgical sites look clean dry and intact the patient is started knee flexion and can begin physical therapy.  Return in 3 weeks.  Note: Patient has sciatic pain has had some deep tissue massage with good result, also has had chiropractic treatment once. History of previous epidural steroids may need more. Resume Neurontin return in 3 weeks therapy will done at [facility] rehabilitation

## 2013-01-19 ENCOUNTER — Ambulatory Visit (INDEPENDENT_AMBULATORY_CARE_PROVIDER_SITE_OTHER): Payer: Self-pay | Admitting: Orthopedic Surgery

## 2013-01-19 VITALS — BP 155/76 | Ht 65.0 in | Wt 194.0 lb

## 2013-01-19 DIAGNOSIS — M1710 Unilateral primary osteoarthritis, unspecified knee: Secondary | ICD-10-CM

## 2013-01-19 DIAGNOSIS — IMO0002 Reserved for concepts with insufficient information to code with codable children: Secondary | ICD-10-CM

## 2013-01-19 NOTE — Patient Instructions (Signed)
Finish therapy    

## 2013-01-19 NOTE — Progress Notes (Signed)
Patient ID: Kerry Richardson, female   DOB: Jun 26, 1940, 72 y.o.   MRN: [id number]  Chief Complaint  Patient presents with  . Follow-up    Post op #2, right knee SARK. DOS 12-23-12.    BP 155/76  Ht 5\' 5"  (1.651 m)  Wt 194 lb (87.998 kg)  BMI 32.28 kg/m2  Is status post arthroscopy right knee and debridement. Severe arthritis noted. Synovitis noted. Chondroplasty and synovectomy performed.  Patient improving very well she had one setback secondary to step up exercises we've asked therapy to discontinue those but otherwise she is doing well she will continue progress to a home program and see me in 4 weeks

## 2013-01-26 ENCOUNTER — Other Ambulatory Visit: Payer: Self-pay

## 2013-02-21 ENCOUNTER — Ambulatory Visit: Payer: Medicare Other | Admitting: Orthopedic Surgery

## 2013-02-22 ENCOUNTER — Encounter: Payer: Self-pay | Admitting: Family Medicine

## 2013-02-22 ENCOUNTER — Ambulatory Visit (INDEPENDENT_AMBULATORY_CARE_PROVIDER_SITE_OTHER): Payer: Medicare Other | Admitting: Family Medicine

## 2013-02-22 VITALS — BP 136/82 | Temp 98.3°F | Ht 60.5 in | Wt 201.6 lb

## 2013-02-22 DIAGNOSIS — J329 Chronic sinusitis, unspecified: Secondary | ICD-10-CM

## 2013-02-22 MED ORDER — LEVOFLOXACIN 500 MG PO TABS: 500.0000 mg | ORAL_TABLET | Freq: Every day | ORAL | Status: AC

## 2013-02-22 NOTE — Progress Notes (Signed)
   Subjective:    Patient ID: Kerry Richardson, female    DOB: 10-01-1940, 72 y.o.   MRN: [id number]  HPI Patient arrives with cough and congestion for a week. Hoarse at times with a productive cough.  Pos procuctive cough internitent fever  Sweating and chills  Wakes up due to coughing, then trouble sleeping  Headache frontal in nature pressure times comes and goes radiates years Review of Systems No vomiting no diarrhea no rash no shortness of breath no abdominal pain no high fevers ROS otherwise negative    Objective:   Physical Exam Alert hydration good H&T moderate his congestion frontal tenderness pharynx normal neck supple lungs clear heart regular in rhythm       Assessment & Plan:  Impression acute rhinosinusitis plan appropriate antibiotics Levaquin daily 10 days. Symptomatic care discussed. WSL

## 2013-02-28 ENCOUNTER — Encounter: Payer: Self-pay | Admitting: Orthopedic Surgery

## 2013-02-28 ENCOUNTER — Ambulatory Visit (INDEPENDENT_AMBULATORY_CARE_PROVIDER_SITE_OTHER): Payer: Self-pay | Admitting: Orthopedic Surgery

## 2013-02-28 VITALS — BP 142/80 | Ht 65.0 in | Wt 194.0 lb

## 2013-02-28 DIAGNOSIS — M1710 Unilateral primary osteoarthritis, unspecified knee: Secondary | ICD-10-CM

## 2013-02-28 DIAGNOSIS — IMO0002 Reserved for concepts with insufficient information to code with codable children: Secondary | ICD-10-CM

## 2013-02-28 DIAGNOSIS — M23321 Other meniscus derangements, posterior horn of medial meniscus, right knee: Secondary | ICD-10-CM

## 2013-02-28 DIAGNOSIS — M23329 Other meniscus derangements, posterior horn of medial meniscus, unspecified knee: Secondary | ICD-10-CM

## 2013-02-28 NOTE — Patient Instructions (Addendum)
Self directed therapy right knee   Reschedule for left knee

## 2013-02-28 NOTE — Progress Notes (Signed)
Patient ID: Kerry Richardson, female   DOB: 04-Dec-1940, 72 y.o.   MRN: [id number] Chief Complaint  Patient presents with  . Follow-up    Yearly recheck left knee and recheck right knee s/p arthroscopy DOS right 12/23/12 and left 03/14/12    Surgical arthroscopy October 3 doing well some mild pain in both knees. She was going to have a knee x-ray today but the x-ray equipment is down. She stopped therapy secondary to illness related to the right knee and will now perform self-directed therapy because of the cost  She has good range of motion in her knee no swelling she can perform a straight leg raise with no extensor lag  Recommend self-directed therapy reschedule for left knee x-ray.

## 2013-03-21 ENCOUNTER — Encounter: Payer: Self-pay | Admitting: Orthopedic Surgery

## 2013-03-21 ENCOUNTER — Ambulatory Visit (INDEPENDENT_AMBULATORY_CARE_PROVIDER_SITE_OTHER): Payer: Medicare Other | Admitting: Orthopedic Surgery

## 2013-03-21 ENCOUNTER — Ambulatory Visit (INDEPENDENT_AMBULATORY_CARE_PROVIDER_SITE_OTHER): Payer: Medicare Other

## 2013-03-21 VITALS — BP 149/69 | Ht 65.0 in | Wt 194.0 lb

## 2013-03-21 DIAGNOSIS — M1710 Unilateral primary osteoarthritis, unspecified knee: Secondary | ICD-10-CM

## 2013-03-21 DIAGNOSIS — M5431 Sciatica, right side: Secondary | ICD-10-CM

## 2013-03-21 DIAGNOSIS — M5430 Sciatica, unspecified side: Secondary | ICD-10-CM

## 2013-03-21 MED ORDER — GABAPENTIN 100 MG PO CAPS: 100.0000 mg | ORAL_CAPSULE | Freq: Three times a day (TID) | ORAL | Status: DC

## 2013-03-21 NOTE — Progress Notes (Signed)
Patient ID: Kerry Richardson, female   DOB: [birth year], 72 y.o.   MRN: [id number]  Chief Complaint  Patient presents with  . Follow-up    Yearly recheck left knee   Separate problem in the perioperative period status post right knee arthroscopy October. 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 She would like her Neurontin refilled.  We discussed the possibility of doing an MRI of her back but she would like to continue with a home exercise program and let us know when she is ready to schedule repeat MRI, last one done greater than 5 years ago.  Diagnosis #1 status post left knee arthroscopy with osteoarthritis left knee Diagnosis #2 status post right knee arthroscopy Diagnoses #3 left leg radiculopathy in the setting of spinal stenosis due to disc disease  Follow up by phone call to schedule MRI  Meds ordered this encounter  Medications  . gabapentin (NEURONTIN) 100 MG capsule    Sig: Take 1 capsule (100 mg total) by mouth 3 (three) times daily.    Dispense:  90 capsule    Refill:  2

## 2013-05-08 ENCOUNTER — Emergency Department (HOSPITAL_COMMUNITY)
Admission: EM | Admit: 2013-05-08 | Discharge: 2013-05-08 | Disposition: A | Discharge status: Home / Self Care | Payer: Medicare Other | Attending: Emergency Medicine | Admitting: Emergency Medicine

## 2013-05-08 ENCOUNTER — Encounter (HOSPITAL_COMMUNITY): Payer: Self-pay | Admitting: Emergency Medicine

## 2013-05-08 ENCOUNTER — Emergency Department (HOSPITAL_COMMUNITY): Payer: Medicare Other

## 2013-05-08 DIAGNOSIS — R079 Chest pain, unspecified: Secondary | ICD-10-CM | POA: Insufficient documentation

## 2013-05-08 DIAGNOSIS — K8020 Calculus of gallbladder without cholecystitis without obstruction: Secondary | ICD-10-CM

## 2013-05-08 DIAGNOSIS — G43909 Migraine, unspecified, not intractable, without status migrainosus: Secondary | ICD-10-CM | POA: Insufficient documentation

## 2013-05-08 DIAGNOSIS — Z90710 Acquired absence of both cervix and uterus: Secondary | ICD-10-CM | POA: Insufficient documentation

## 2013-05-08 DIAGNOSIS — M129 Arthropathy, unspecified: Secondary | ICD-10-CM | POA: Insufficient documentation

## 2013-05-08 DIAGNOSIS — Z79899 Other long term (current) drug therapy: Secondary | ICD-10-CM | POA: Insufficient documentation

## 2013-05-08 DIAGNOSIS — Z791 Long term (current) use of non-steroidal anti-inflammatories (NSAID): Secondary | ICD-10-CM | POA: Insufficient documentation

## 2013-05-08 LAB — CBC WITH DIFFERENTIAL/PLATELET
BASOS ABS: 0 10*3/uL (ref 0.0–0.1)
BASOS PCT: 0 % (ref 0–1)
EOS ABS: 0.1 10*3/uL (ref 0.0–0.7)
EOS PCT: 0 % (ref 0–5)
HEMATOCRIT: 40.8 % (ref 36.0–46.0)
HEMOGLOBIN: 13.5 g/dL (ref 12.0–15.0)
Lymphocytes Relative: 8 % — ABNORMAL LOW (ref 12–46)
Lymphs Abs: 1 10*3/uL (ref 0.7–4.0)
MCH: 29 pg (ref 26.0–34.0)
MCHC: 33.1 g/dL (ref 30.0–36.0)
MCV: 87.7 fL (ref 78.0–100.0)
MONO ABS: 0.4 10*3/uL (ref 0.1–1.0)
MONOS PCT: 3 % (ref 3–12)
Neutro Abs: 11.9 10*3/uL — ABNORMAL HIGH (ref 1.7–7.7)
Neutrophils Relative %: 89 % — ABNORMAL HIGH (ref 43–77)
Platelets: 336 10*3/uL (ref 150–400)
RBC: 4.65 MIL/uL (ref 3.87–5.11)
RDW: 14.4 % (ref 11.5–15.5)
WBC: 13.3 10*3/uL — ABNORMAL HIGH (ref 4.0–10.5)

## 2013-05-08 LAB — COMPREHENSIVE METABOLIC PANEL
ALBUMIN: 3.6 g/dL (ref 3.5–5.2)
ALT: 216 U/L — ABNORMAL HIGH (ref 0–35)
AST: 355 U/L — AB (ref 0–37)
Alkaline Phosphatase: 193 U/L — ABNORMAL HIGH (ref 39–117)
BUN: 10 mg/dL (ref 6–23)
CALCIUM: 9.5 mg/dL (ref 8.4–10.5)
CO2: 29 mEq/L (ref 19–32)
CREATININE: 0.63 mg/dL (ref 0.50–1.10)
Chloride: 98 mEq/L (ref 96–112)
GFR calc Af Amer: >90 mL/min (ref >90)
GFR calc non Af Amer: 87 mL/min — ABNORMAL LOW (ref >90)
Glucose, Bld: 121 mg/dL — ABNORMAL HIGH (ref 70–99)
Potassium: 4.2 mEq/L (ref 3.7–5.3)
Sodium: 138 mEq/L (ref 137–147)
TOTAL PROTEIN: 7.8 g/dL (ref 6.0–8.3)
Total Bilirubin: 0.8 mg/dL (ref 0.3–1.2)

## 2013-05-08 LAB — LIPASE, BLOOD: LIPASE: 20 U/L (ref 11–59)

## 2013-05-08 LAB — TROPONIN I: Troponin I: <0.3 ng/mL (ref <0.30)

## 2013-05-08 MED ORDER — GI COCKTAIL ~~LOC~~
30.0000 mL | Freq: Once | ORAL | Status: AC
  Administered 2013-05-08: 30 mL via ORAL
  Filled 2013-05-08: qty 30

## 2013-05-08 MED ORDER — OXYCODONE-ACETAMINOPHEN 5-325 MG PO TABS: 1.0000 | ORAL_TABLET | ORAL | Status: DC | PRN

## 2013-05-08 MED ORDER — NITROGLYCERIN 0.4 MG SL SUBL
0.4000 mg | SUBLINGUAL_TABLET | SUBLINGUAL | Status: DC | PRN
  Administered 2013-05-08 (×2): 0.4 mg via SUBLINGUAL

## 2013-05-08 MED ORDER — HYDROMORPHONE HCL PF 1 MG/ML IJ SOLN
1.0000 mg | Freq: Once | INTRAMUSCULAR | Status: AC
  Administered 2013-05-08: 1 mg via INTRAVENOUS
  Filled 2013-05-08: qty 1

## 2013-05-08 MED ORDER — ASPIRIN 81 MG PO CHEW
324.0000 mg | CHEWABLE_TABLET | Freq: Once | ORAL | Status: AC
  Administered 2013-05-08: 324 mg via ORAL

## 2013-05-08 MED ORDER — ASPIRIN 81 MG PO CHEW
CHEWABLE_TABLET | ORAL | Status: AC
  Filled 2013-05-08: qty 4

## 2013-05-08 MED ORDER — CIPROFLOXACIN HCL 500 MG PO TABS: 500.0000 mg | ORAL_TABLET | Freq: Two times a day (BID) | ORAL | Status: DC

## 2013-05-08 MED ORDER — NITROGLYCERIN 0.4 MG SL SUBL
SUBLINGUAL_TABLET | SUBLINGUAL | Status: AC
  Administered 2013-05-08: 0.4 mg via SUBLINGUAL
  Filled 2013-05-08: qty 62.5

## 2013-05-08 NOTE — ED Provider Notes (Signed)
Assumption of care note- 0730  Medications  nitroGLYCERIN (NITROSTAT) SL tablet 0.4 mg (0.4 mg Sublingual Given 05/08/13 0650)  aspirin chewable tablet 324 mg (324 mg Oral Given 05/08/13 0643)  gi cocktail (Maalox,Lidocaine,Donnatal) (30 mLs Oral Given 05/08/13 0704)  HYDROmorphone (DILAUDID) injection 1 mg (1 mg Intravenous Given 05/08/13 0733)    Patient Vitals for the past 24 hrs:  BP Temp Pulse Resp SpO2 Height Weight  05/08/13 0654 144/65 mmHg - 72 19 95 % - -  05/08/13 0650 139/60 mmHg - 75 16 95 % - -  05/08/13 0627 171/69 mmHg 98.2 F (36.8 C) 79 20 96 % 5' 1.5" (1.562 m) 190 lb (86.183 kg)    8:32 AM Reevaluation with update and discussion. After initial assessment and treatment, an updated evaluation reveals she is comfortable now, but "feels loopy". She states her pain was severe prior to meds, and that there was no prior sx in the past. No prior abdominal surgery . Jamile Rekowski L    09:23- consultation with general surgery, Dr. Lovell SheehanJenkins. He will evaluate the patient in about one hour, , to allow time for the narcotic analgesia to metabolize.  12:29 PM Reevaluation with update and discussion. After initial assessment and treatment, an updated evaluation reveals She is comfortable now. Dr. Lovell SheehanJenkins has seen her and wants her to go home and see him on Thursday (05/11/13) at 10:30 AM. Flint MelterWENTZ,Ada Holness L   Flint MelterElliott L Shanea Karney, MD 05/08/13 1230

## 2013-05-08 NOTE — ED Provider Notes (Signed)
CSN: [id number]     Arrival date & time 05/08/13  4696 History   None    Chief Complaint  Patient presents with  . Abdominal Pain     (Consider location/radiation/quality/duration/timing/severity/associated sxs/prior Treatment) HPI Comments: 73 year old female, history of several minor medical problems including back pain, acid reflux and bursitis. She is a nonsmoker who has no history of hypertension, diabetes or hypercholesterolemia. She has no significant family history of early heart disease.  She states that approximately 3:30 in the afternoon yesterday she developed a pain in her chest located in the epigastrium and lower chest, described as a heaviness, has been persistent for the most part but states that it did resolve spontaneously yesterday and then recurred again last night. She states that there is a possible positional component to her symptoms, worse when she lays down, better when she sits up area and she has had no medication prior to arrival, denies shortness of breath but did have associated diaphoresis approximately 2 hours prior to arrival when the pain worsened. Currently the pain is 8/10, but has no radiation, no associated leg swelling, no cough or fever, no recent immobilization, trauma. She does use oral estrogens. she denies any history of stress test or heart catheterization and other than an EKG has had no other cardiac evaluation  Patient is a 73 y.o. female presenting with abdominal pain. The history is provided by the patient.  Abdominal Pain   Past Medical History  Diagnosis Date  . Bulging discs   . Bursitis   . Arthritis   . H/O removal of cyst     finger (uncertain of which finger)  . PONV (postoperative nausea and vomiting)   . Migraine headache   . Allergy     chronic  . Reflux   . Back pain   . DDD (degenerative disc disease)   . Neck pain   . GERD (gastroesophageal reflux disease)   . Glaucoma    Past Surgical History  Procedure Laterality  Date  . Cervical fusion    . Abdominal hysterectomy    . Foot surgery    . Carpal tunnel release      bilateral  . Knee arthroscopy with medial menisectomy  03/14/2012    Procedure: KNEE ARTHROSCOPY WITH MEDIAL MENISECTOMY;  Surgeon: Vickki Hearing, MD;  Location: [facility];  Service: Orthopedics;  Laterality: Left;  . Chondroplasty  03/14/2012    Procedure: CHONDROPLASTY;  Surgeon: Vickki Hearing, MD;  Location: [facility];  Service: Orthopedics;  Laterality: Left;  medial condyle  . Chondroplasty Right 12/23/2012    Procedure: KNEE ARTHROSCOPY WITH CHONDROPLASTY WITH LIMITED DEBRIDMENT;  Surgeon: Vickki Hearing, MD;  Location: [facility];  Service: Orthopedics;  Laterality: Right;   Family History  Problem Relation Age of Onset  . Hypertension Mother     borderline   History  Substance Use Topics  . Smoking status: Never Smoker   . Smokeless tobacco: Not on file  . Alcohol Use: No   OB History   Grav Para Term Preterm Abortions TAB SAB Ect Mult Living                 Review of Systems  Gastrointestinal: Positive for abdominal pain.  All other systems reviewed and are negative.      Allergies  Codeine  Home Medications   Current Outpatient Rx  Name  Route  Sig  Dispense  Refill  . Biotin 5000 MCG TABS   Oral  Take 1 tablet by mouth daily.         . Cholecalciferol (VITAMIN D) 2000 UNITS CAPS   Oral   Take 1 capsule by mouth daily.         Marland Kitchen estrogens, conjugated, (PREMARIN) 0.3 MG tablet   Oral   Take 1 tablet (0.3 mg total) by mouth daily.   30 tablet   11   . gabapentin (NEURONTIN) 100 MG capsule   Oral   Take 1 capsule (100 mg total) by mouth 3 (three) times daily.   90 capsule   2   . Magnesium 250 MG TABS   Oral   Take 250 mg by mouth 2 (two) times daily.         Marland Kitchen ibuprofen (ADVIL,MOTRIN) 200 MG tablet   Oral   Take 400 mg by mouth every 6 (six) hours as needed.           BP 171/69  Pulse 79  Temp(Src) 98.2 F (36.8 C)  Resp  20  Ht 5' 1.5" (1.562 m)  Wt 190 lb (86.183 kg)  BMI 35.32 kg/m2  SpO2 96% Physical Exam  Nursing note and vitals reviewed. Constitutional: She appears well-developed and well-nourished. No distress.  HENT:  Head: Normocephalic and atraumatic.  Mouth/Throat: Oropharynx is clear and moist. No oropharyngeal exudate.  Eyes: Conjunctivae and EOM are normal. Pupils are equal, round, and reactive to light. Right eye exhibits no discharge. Left eye exhibits no discharge. No scleral icterus.  Neck: Normal range of motion. Neck supple. No JVD present. No thyromegaly present.  Cardiovascular: Normal rate, regular rhythm, normal heart sounds and intact distal pulses.  Exam reveals no gallop and no friction rub.   No murmur heard. Pulmonary/Chest: Effort normal and breath sounds normal. No respiratory distress. She has no wheezes. She has no rales.  Abdominal: Soft. Bowel sounds are normal. She exhibits no distension and no mass. There is tenderness ( RUQ mild ttp, no murphy's sign present, no other abd ttp).  Musculoskeletal: Normal range of motion. She exhibits no edema and no tenderness.  Lymphadenopathy:    She has no cervical adenopathy.  Neurological: She is alert. Coordination normal.  Skin: Skin is warm and dry. No rash noted. No erythema.  Psychiatric: She has a normal mood and affect. Her behavior is normal.    ED Course  Procedures (including critical care time) Labs Review Labs Reviewed  CBC WITH DIFFERENTIAL  COMPREHENSIVE METABOLIC PANEL  TROPONIN I  LIPASE, BLOOD   Imaging Review No results found.  EKG Interpretation    Date/Time:  Monday May 08 2013 06:24:15 EST Ventricular Rate:  79 PR Interval:  164 QRS Duration: 92 QT Interval:  372 QTC Calculation: 426 R Axis:   -56 Text Interpretation:  Normal sinus rhythm Left anterior fascicular block Abnormal ECG When compared with ECG of 08-Mar-2012 11:15, Non-specific change in ST segment in Anterior leads Confirmed by  Walther Sanagustin  MD, Neveyah Garzon ([id number]) on 05/08/2013 6:30:03 AM            MDM   Final diagnoses:  None    The patient has no reproducible chest wall or abdominal tenderness, her EKG does have some abnormal appearance to the ST and T. segments in several leads however this is completely unchanged from her prior EKG from December of 2013. Her symptoms have been ongoing for several hours, it has been at least 6-1/2 hours since her symptoms recurred last night. Her cardiac exam is normal, she is slightly  hypertensive. Troponin pending, chest x-ray pending.  She does report having 2 separate episodes of similar symptoms which were short lived but similar in location over the past 2 months. 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  7 AM - no improvement after nitroglycerin - ongoing pain for 7+ hours - normal troponin, unchanged ECG but slight leukocytosis - non specific marker in this case.  CXR pending.  Will try GI cocktail - slight improvement.    CMP shows elevation in LFT's, US ordered.  Change of shift - care signed over to Dr. Effie ShyWentz pending US.  I have reevaluated Ms Rabel's abdomen at 7:30 AM - still with no murphy's sign - mild ttp in the RUQ.   Meds given in ED:  Medications  nitroGLYCERIN (NITROSTAT) SL tablet 0.4 mg (0.4 mg Sublingual Given 05/08/13 0650)  aspirin chewable tablet 324 mg (324 mg Oral Given 05/08/13 0643)  gi cocktail (Maalox,Lidocaine,Donnatal) (30 mLs Oral Given 05/08/13 0704)    New Prescriptions   No medications on file      Vida RollerBrian D Ayonna Speranza, MD 05/08/13 [phone]

## 2013-05-08 NOTE — ED Notes (Signed)
Pt up to bathroom without difficulty.

## 2013-05-08 NOTE — Discharge Instructions (Signed)
Biliary Colic  °Biliary colic is a steady or irregular pain in the upper abdomen. It is usually under the right side of the rib cage. It happens when gallstones interfere with the normal flow of bile from the gallbladder. Bile is a liquid that helps to digest fats. Bile is made in the liver and stored in the gallbladder. When you eat a meal, bile passes from the gallbladder through the cystic duct and the common bile duct into the small intestine. There, it mixes with partially digested food. If a gallstone blocks either of these ducts, the normal flow of bile is blocked. The muscle cells in the bile duct contract forcefully to try to move the stone. This causes the pain of biliary colic.  °SYMPTOMS  °· A person with biliary colic usually complains of pain in the upper abdomen. This pain can be: °· In the center of the upper abdomen just below the breastbone. °· In the upper-right part of the abdomen, near the gallbladder and liver. °· Spread back toward the right shoulder blade. °· Nausea and vomiting. °· The pain usually occurs after eating. °· Biliary colic is usually triggered by the digestive system's demand for bile. The demand for bile is high after fatty meals. Symptoms can also occur when a person who has been fasting suddenly eats a very large meal. Most episodes of biliary colic pass after 1 to 5 hours. After the most intense pain passes, your abdomen may continue to ache mildly for about 24 hours. °DIAGNOSIS  °After you describe your symptoms, your caregiver will perform a physical exam. He or she will pay attention to the upper right portion of your belly (abdomen). This is the area of your liver and gallbladder. An ultrasound will help your caregiver look for gallstones. Specialized scans of the gallbladder may also be done. Blood tests may be done, especially if you have fever or if your pain persists. °PREVENTION  °Biliary colic can be prevented by controlling the risk factors for gallstones. Some of  these risk factors, such as heredity, increasing age, and pregnancy are a normal part of life. Obesity and a high-fat diet are risk factors you can change through a healthy lifestyle. Women going through menopause who take hormone replacement therapy (estrogen) are also more likely to develop biliary colic. °TREATMENT  °· Pain medication may be prescribed. °· You may be encouraged to eat a fat-free diet. °· If the first episode of biliary colic is severe, or episodes of colic keep retuning, surgery to remove the gallbladder (cholecystectomy) is usually recommended. This procedure can be done through small incisions using an instrument called a laparoscope. The procedure often requires a brief stay in the hospital. Some people can leave the hospital the same day. It is the most widely used treatment in people troubled by painful gallstones. It is effective and safe, with no complications in more than 90% of cases. °· If surgery cannot be done, medication that dissolves gallstones may be used. This medication is expensive and can take months or years to work. Only small stones will dissolve. °· Rarely, medication to dissolve gallstones is combined with a procedure called shock-wave lithotripsy. This procedure uses carefully aimed shock waves to break up gallstones. In many people treated with this procedure, gallstones form again within a few years. °PROGNOSIS  °If gallstones block your cystic duct or common bile duct, you are at risk for repeated episodes of biliary colic. There is also a 25% chance that you will develop   a gallbladder infection(acute cholecystitis), or some other complication of gallstones within 10 to 20 years. If you have surgery, schedule it at a time that is convenient for you and at a time when you are not sick. °HOME CARE INSTRUCTIONS  °· Drink plenty of clear fluids. °· Avoid fatty, greasy or fried foods, or any foods that make your pain worse. °· Take medications as directed. °SEEK MEDICAL  CARE IF:  °· You develop a fever over 100.5° F (38.1° C). °· Your pain gets worse over time. °· You develop nausea that prevents you from eating and drinking. °· You develop vomiting. °SEEK IMMEDIATE MEDICAL CARE IF:  °· You have continuous or severe belly (abdominal) pain which is not relieved with medications. °· You develop nausea and vomiting which is not relieved with medications. °· You have symptoms of biliary colic and you suddenly develop a fever and shaking chills. This may signal cholecystitis. Call your caregiver immediately. °· You develop a yellow color to your skin or the white part of your eyes (jaundice). °Document Released: 08/10/2005 Document Revised: 06/01/2011 Document Reviewed: 10/20/2007 °ExitCare® Patient Information ©2014 [employer]. ° °Cholelithiasis °Cholelithiasis (also called gallstones) is a form of gallbladder disease in which gallstones form in your gallbladder. The gallbladder is an organ that stores bile made in the liver, which helps digest fats. Gallstones begin as small crystals and slowly grow into stones. Gallstone pain occurs when the gallbladder spasms and a gallstone is blocking the duct. Pain can also occur when a stone passes out of the duct.  °RISK FACTORS °· Being female.   °· Having multiple pregnancies. Health care providers sometimes advise removing diseased gallbladders before future pregnancies.   °· Being obese. °· Eating a diet heavy in fried foods and fat.   °· Being older than 60 years and increasing age.   °· Prolonged use of medicines containing female hormones.   °· Having diabetes mellitus.   °· Rapidly losing weight.   °· Having a family history of gallstones (heredity).   °SYMPTOMS °· Nausea.   °· Vomiting. °· Abdominal pain.   °· Yellowing of the skin (jaundice).   °· Sudden pain. It may persist from several minutes to several hours. °· Fever.   °· Tenderness to the touch.  °In some cases, when gallstones do not move into the bile duct, people have no  pain or symptoms. These are called "silent" gallstones.  °TREATMENT °Silent gallstones do not need treatment. In severe cases, emergency surgery may be required. Options for treatment include: °· Surgery to remove the gallbladder. This is the most common treatment. °· Medicines. These do not always work and may take 6 12 months or more to work. °· Shock wave treatment (extracorporeal biliary lithotripsy). In this treatment an ultrasound machine sends shock waves to the gallbladder to break gallstones into smaller pieces that can pass into the intestines or be dissolved by medicine. °HOME CARE INSTRUCTIONS  °· Only take over-the-counter or prescription medicines for pain, discomfort, or fever as directed by your health care provider.   °· Follow a low-fat diet until seen again by your health care provider. Fat causes the gallbladder to contract, which can result in pain.   °· Follow up with your health care provider as directed. Attacks are almost always recurrent and surgery is usually required for permanent treatment.   °SEEK IMMEDIATE MEDICAL CARE IF:  °· Your pain increases and is not controlled by medicines.   °· You have a fever or persistent symptoms for more than 2 3 days.   °· You have a fever and your symptoms   suddenly get worse.   °· You have persistent nausea and vomiting.   °MAKE SURE YOU:  °· Understand these instructions. °· Will watch your condition. °· Will get help right away if you are not doing well or get worse. °Document Released: 03/05/2005 Document Revised: 11/09/2012 Document Reviewed: 08/31/2012 °ExitCare® Patient Information ©2014 [employer]. °Fat and Cholesterol Control Diet °Fat and cholesterol levels in your blood and organs are influenced by your diet. High levels of fat and cholesterol may lead to diseases of the heart, small and large blood vessels, gallbladder, liver, and pancreas. °CONTROLLING FAT AND CHOLESTEROL WITH DIET °Although exercise and lifestyle factors are important,  your diet is key. That is because certain foods are known to raise cholesterol and others to lower it. The goal is to balance foods for their effect on cholesterol and more importantly, to replace saturated and trans fat with other types of fat, such as monounsaturated fat, polyunsaturated fat, and omega-3 fatty acids. °On average, a person should consume no more than 15 to 17 g of saturated fat daily. Saturated and trans fats are considered "bad" fats, and they will raise LDL cholesterol. Saturated fats are primarily found in animal products such as meats, butter, and cream. However, that does not mean you need to give up all your favorite foods. Today, there are good tasting, low-fat, low-cholesterol substitutes for most of the things you like to eat. Choose low-fat or nonfat alternatives. Choose round or loin cuts of red meat. These types of cuts are lowest in fat and cholesterol. Chicken (without the skin), fish, veal, and ground turkey breast are great choices. Eliminate fatty meats, such as hot dogs and salami. Even shellfish have little or no saturated fat. Have a 3 oz (85 g) portion when you eat lean meat, poultry, or fish. °Trans fats are also called "partially hydrogenated oils." They are oils that have been scientifically manipulated so that they are solid at room temperature resulting in a longer shelf life and improved taste and texture of foods in which they are added. Trans fats are found in stick margarine, some tub margarines, cookies, crackers, and baked goods.  °When baking and cooking, oils are a great substitute for butter. The monounsaturated oils are especially beneficial since it is believed they lower LDL and raise HDL. The oils you should avoid entirely are saturated tropical oils, such as coconut and palm.  °Remember to eat a lot from food groups that are naturally free of saturated and trans fat, including fish, fruit, vegetables, beans, grains (barley, rice, couscous, bulgur wheat), and  pasta (without cream sauces).  °IDENTIFYING FOODS THAT LOWER FAT AND CHOLESTEROL  °Soluble fiber may lower your cholesterol. This type of fiber is found in fruits such as apples, vegetables such as broccoli, potatoes, and carrots, legumes such as beans, peas, and lentils, and grains such as barley. Foods fortified with plant sterols (phytosterol) may also lower cholesterol. You should eat at least 2 g per day of these foods for a cholesterol lowering effect.  °Read package labels to identify low-saturated fats, trans fat free, and low-fat foods at the supermarket. Select cheeses that have only 2 to 3 g saturated fat per ounce. Use a heart-healthy tub margarine that is free of trans fats or partially hydrogenated oil. When buying baked goods (cookies, crackers), avoid partially hydrogenated oils. Breads and muffins should be made from whole grains (whole-wheat or whole oat flour, instead of "flour" or "enriched flour"). Buy non-creamy canned soups with reduced salt and no added   added fats.  FOOD PREPARATION TECHNIQUES  Never deep-fry. If you must fry, either stir-fry, which uses very little fat, or use non-stick cooking sprays. When possible, broil, bake, or roast meats, and steam vegetables. Instead of putting butter or margarine on vegetables, use lemon and herbs, applesauce, and cinnamon (for squash and sweet potatoes). Use nonfat yogurt, salsa, and low-fat dressings for salads.  LOW-SATURATED FAT / LOW-FAT FOOD SUBSTITUTES Meats / Saturated Fat (g)  Avoid: Steak, marbled (3 oz/85 g) / 11 g  Choose: Steak, lean (3 oz/85 g) / 4 g  Avoid: Hamburger (3 oz/85 g) / 7 g  Choose: Hamburger, lean (3 oz/85 g) / 5 g  Avoid: Ham (3 oz/85 g) / 6 g  Choose: Ham, lean cut (3 oz/85 g) / 2.4 g  Avoid: Chicken, with skin, dark meat (3 oz/85 g) / 4 g  Choose: Chicken, skin removed, dark meat (3 oz/85 g) / 2 g  Avoid: Chicken, with skin, light meat (3 oz/85 g) / 2.5 g  Choose: Chicken, skin removed, light meat (3  oz/85 g) / 1 g Dairy / Saturated Fat (g)  Avoid: Whole milk (1 cup) / 5 g  Choose: Low-fat milk, 2% (1 cup) / 3 g  Choose: Low-fat milk, 1% (1 cup) / 1.5 g  Choose: Skim milk (1 cup) / 0.3 g  Avoid: Hard cheese (1 oz/28 g) / 6 g  Choose: Skim milk cheese (1 oz/28 g) / 2 to 3 g  Avoid: Cottage cheese, 4% fat (1 cup) / 6.5 g  Choose: Low-fat cottage cheese, 1% fat (1 cup) / 1.5 g  Avoid: Ice cream (1 cup) / 9 g  Choose: Sherbet (1 cup) / 2.5 g  Choose: Nonfat frozen yogurt (1 cup) / 0.3 g  Choose: Frozen fruit bar / trace  Avoid: Whipped cream (1 tbs) / 3.5 g  Choose: Nondairy whipped topping (1 tbs) / 1 g Condiments / Saturated Fat (g)  Avoid: Mayonnaise (1 tbs) / 2 g  Choose: Low-fat mayonnaise (1 tbs) / 1 g  Avoid: Butter (1 tbs) / 7 g  Choose: Extra light margarine (1 tbs) / 1 g  Avoid: Coconut oil (1 tbs) / 11.8 g  Choose: Olive oil (1 tbs) / 1.8 g  Choose: Corn oil (1 tbs) / 1.7 g  Choose: Safflower oil (1 tbs) / 1.2 g  Choose: Sunflower oil (1 tbs) / 1.4 g  Choose: Soybean oil (1 tbs) / 2.4 g  Choose: Canola oil (1 tbs) / 1 g Document Released: 03/09/2005 Document Revised: 07/04/2012 Document Reviewed: 08/28/2010 [employer] Patient Information 2014 [place] [place]

## 2013-05-08 NOTE — ED Notes (Addendum)
Epigastric pain that started at 3:30 pm yesterday that radiated up into her chest x 1 time, diaphoresis that started around 4am this morning, and dry mouth.. Pt states that the pain has been constant at a 10.

## 2013-05-08 NOTE — ED Notes (Signed)
nad noted prior to dc. Dc instructions reviewed and explained. Voiced understanding. 2 script given to pt.

## 2013-05-08 NOTE — ED Notes (Signed)
Pt denies any changes in pain after 3 nitro tablets.

## 2013-05-08 NOTE — ED Notes (Signed)
Dr. Lovell SheehanJenkins here to see pt

## 2013-05-11 ENCOUNTER — Encounter (HOSPITAL_COMMUNITY): Payer: Self-pay | Admitting: Pharmacy Technician

## 2013-05-12 ENCOUNTER — Other Ambulatory Visit: Payer: Self-pay | Admitting: Family Medicine

## 2013-05-12 ENCOUNTER — Encounter (HOSPITAL_COMMUNITY)
Admission: RE | Admit: 2013-05-12 | Discharge: 2013-05-12 | Disposition: A | Discharge status: Home / Self Care | Payer: Medicare Other | Source: Ambulatory Visit | Attending: General Surgery | Admitting: General Surgery

## 2013-05-12 NOTE — H&P (Signed)
  NTS SOAP Note  Vital Signs:  Vitals as of: 05/11/2013: Systolic 158: Diastolic 82: Heart Rate 70: Temp 97.44F: Height 345ft 1.5in: Weight 149Lbs 5 Ounces: BMI 27.76  BMI : 27.76 kg/m2  Subjective: This [age] Years 668 Months old Female presents for of gallstones.  Seen in ER for epigastric pain, found to have cholelithaisis.  U/S showed common bile duct 9mm.  No choledocholithiasis noted.  LFT's elevated, normal bilirubin.  Currently feels much better.  Some food intolerance noted.  No fever, chills, jaundice.  Review of Symptoms:  Constitutional:unremarkable   Head:unremarkable    Eyes:unremarkable   Nose/Mouth/Throat:unremarkable Cardiovascular:  unremarkable   Respiratory:unremarkable   Gastrointestinal:  unremarkable   Genitourinary:unremarkable       back pain Skin:unremarkable Hematolgic/Lymphatic:unremarkable     Allergic/Immunologic:unremarkable     Past Medical History:    Reviewed  Past Medical History  Surgical History: hysterectomy, arthroscopies, bilateral carpal tunnel, cervical fusion Medical Problems: low estrogen Allergies: codeine Medications: neurontin, premarin   Social History:Reviewed  Social History  Preferred Language: English Race:  White Ethnicity: Not Hispanic / Latino Age: [age] Years 8 Months Marital Status:  M Alcohol: no Recreational drug(s): no   Smoking Status: Never smoker reviewed on 05/11/2013 Functional Status reviewed on 05/11/2013 ------------------------------------------------ Bathing: Normal Cooking: Normal Dressing: Normal Driving: Normal Eating: Normal Managing Meds: Normal Oral Care: Normal Shopping: Normal Toileting: Normal Transferring: Normal Walking: Normal Cognitive Status reviewed on 05/11/2013 ------------------------------------------------ Attention: Normal Decision Making: Normal Language: Normal Memory: Normal Motor: Normal Perception: Normal Problem Solving:  Normal Visual and Spatial: Normal   Family History:  Reviewed  Family Health History Mother, Living; Congestive heart failure;  Father, Deceased; History Unknown    Objective Information: General:  Well appearing, well nourished in no distress.   no scleral icterus Heart:  RRR, no murmur or gallop.  Normal S1, S2.  No S3, S4.  Lungs:    CTA bilaterally, no wheezes, rhonchi, rales.  Breathing unlabored. Abdomen:Soft, NT/ND, no HSM, no masses.  Assessment:Biliary colic, cholelithiasis  Diagnoses: 574.20 Gallstone (Calculus of gallbladder without cholecystitis without obstruction)  Procedures: [id number] - OFFICE OUTPATIENT NEW 30 MINUTES    Plan:  Scheduled for laparoscopic cholecystectomy with cholangiograms for 05/15/13.   Patient Education:Alternative treatments to surgery were discussed with patient (and family).  Risks and benefits  of procedure including bleeding, infection, hepatobiliary injury, and the possibility of an open procedure were fully explained to the patient (and family) who gave informed consent. Patient/family questions were addressed.  Follow-up:Pending Surgery

## 2013-05-15 ENCOUNTER — Ambulatory Visit (HOSPITAL_COMMUNITY): Payer: Medicare Other

## 2013-05-15 ENCOUNTER — Encounter (HOSPITAL_COMMUNITY): Payer: Self-pay | Admitting: *Deleted

## 2013-05-15 ENCOUNTER — Encounter (HOSPITAL_COMMUNITY): Payer: Medicare Other | Admitting: Anesthesiology

## 2013-05-15 ENCOUNTER — Encounter (HOSPITAL_COMMUNITY)
Admission: RE | Disposition: A | Discharge status: Home / Self Care | Payer: Self-pay | Source: Ambulatory Visit | Attending: General Surgery

## 2013-05-15 ENCOUNTER — Ambulatory Visit (HOSPITAL_COMMUNITY): Payer: Medicare Other | Admitting: Anesthesiology

## 2013-05-15 ENCOUNTER — Ambulatory Visit (HOSPITAL_COMMUNITY)
Admission: RE | Admit: 2013-05-15 | Discharge: 2013-05-15 | Disposition: A | Discharge status: Home / Self Care | Payer: Medicare Other | Source: Ambulatory Visit | Attending: General Surgery | Admitting: General Surgery

## 2013-05-15 DIAGNOSIS — Z01812 Encounter for preprocedural laboratory examination: Secondary | ICD-10-CM | POA: Insufficient documentation

## 2013-05-15 DIAGNOSIS — K8010 Calculus of gallbladder with chronic cholecystitis without obstruction: Secondary | ICD-10-CM | POA: Insufficient documentation

## 2013-05-15 HISTORY — PX: CHOLECYSTECTOMY: SHX55

## 2013-05-15 LAB — HEPATIC FUNCTION PANEL
ALK PHOS: 205 U/L — AB (ref 39–117)
ALT: 109 U/L — AB (ref 0–35)
AST: 23 U/L (ref 0–37)
Albumin: 3.7 g/dL (ref 3.5–5.2)
BILIRUBIN TOTAL: 0.4 mg/dL (ref 0.3–1.2)
Bilirubin, Direct: <0.2 mg/dL (ref 0.0–0.3)
TOTAL PROTEIN: 7.6 g/dL (ref 6.0–8.3)

## 2013-05-15 SURGERY — LAPAROSCOPIC CHOLECYSTECTOMY WITH INTRAOPERATIVE CHOLANGIOGRAM
Anesthesia: General

## 2013-05-15 MED ORDER — ONDANSETRON HCL 4 MG/2ML IJ SOLN
4.0000 mg | Freq: Once | INTRAMUSCULAR | Status: AC
  Administered 2013-05-15: 4 mg via INTRAVENOUS
  Filled 2013-05-15: qty 2

## 2013-05-15 MED ORDER — POVIDONE-IODINE 10 % EX OINT
TOPICAL_OINTMENT | CUTANEOUS | Status: AC
  Filled 2013-05-15: qty 1

## 2013-05-15 MED ORDER — KETOROLAC TROMETHAMINE 30 MG/ML IJ SOLN
30.0000 mg | Freq: Once | INTRAMUSCULAR | Status: AC
  Administered 2013-05-15: 30 mg via INTRAVENOUS

## 2013-05-15 MED ORDER — CLINDAMYCIN PHOSPHATE 600 MG/50ML IV SOLN
600.0000 mg | Freq: Once | INTRAVENOUS | Status: AC
  Administered 2013-05-15: 600 mg via INTRAVENOUS
  Filled 2013-05-15: qty 50

## 2013-05-15 MED ORDER — FENTANYL CITRATE 0.05 MG/ML IJ SOLN
25.0000 ug | INTRAMUSCULAR | Status: DC | PRN
  Administered 2013-05-15 (×2): 50 ug via INTRAVENOUS

## 2013-05-15 MED ORDER — MIDAZOLAM HCL 2 MG/2ML IJ SOLN
1.0000 mg | INTRAMUSCULAR | Status: DC | PRN
  Administered 2013-05-15: 2 mg via INTRAVENOUS
  Filled 2013-05-15: qty 2

## 2013-05-15 MED ORDER — GLYCOPYRROLATE 0.2 MG/ML IJ SOLN
0.2000 mg | Freq: Once | INTRAMUSCULAR | Status: AC
  Administered 2013-05-15: 0.2 mg via INTRAVENOUS
  Filled 2013-05-15: qty 1

## 2013-05-15 MED ORDER — NEOSTIGMINE METHYLSULFATE 1 MG/ML IJ SOLN
INTRAMUSCULAR | Status: DC | PRN
  Administered 2013-05-15: 3 mg via INTRAVENOUS

## 2013-05-15 MED ORDER — SCOPOLAMINE 1 MG/3DAYS TD PT72
1.0000 | MEDICATED_PATCH | Freq: Once | TRANSDERMAL | Status: DC
  Administered 2013-05-15: 1.5 mg via TRANSDERMAL
  Filled 2013-05-15: qty 1

## 2013-05-15 MED ORDER — ONDANSETRON HCL 4 MG/2ML IJ SOLN: 4.0000 mg | Freq: Once | INTRAMUSCULAR | Status: DC | PRN

## 2013-05-15 MED ORDER — CHLORHEXIDINE GLUCONATE 4 % EX LIQD: 1.0000 "application " | Freq: Once | CUTANEOUS | Status: DC

## 2013-05-15 MED ORDER — HYDROCODONE-ACETAMINOPHEN 5-325 MG PO TABS: 1.0000 | ORAL_TABLET | Freq: Four times a day (QID) | ORAL | Status: DC | PRN

## 2013-05-15 MED ORDER — GLYCOPYRROLATE 0.2 MG/ML IJ SOLN
INTRAMUSCULAR | Status: AC
  Filled 2013-05-15: qty 2

## 2013-05-15 MED ORDER — GLYCOPYRROLATE 0.2 MG/ML IJ SOLN
INTRAMUSCULAR | Status: DC | PRN
  Administered 2013-05-15: 0.6 mg via INTRAVENOUS

## 2013-05-15 MED ORDER — IOHEXOL 350 MG/ML SOLN
INTRAVENOUS | Status: DC | PRN
  Administered 2013-05-15: 10 mL

## 2013-05-15 MED ORDER — GLYCOPYRROLATE 0.2 MG/ML IJ SOLN
INTRAMUSCULAR | Status: AC
  Filled 2013-05-15: qty 1

## 2013-05-15 MED ORDER — BUPIVACAINE HCL 0.5 % IJ SOLN
INTRAMUSCULAR | Status: DC | PRN
  Administered 2013-05-15: 10 mL

## 2013-05-15 MED ORDER — FENTANYL CITRATE 0.05 MG/ML IJ SOLN
INTRAMUSCULAR | Status: AC
  Filled 2013-05-15: qty 5

## 2013-05-15 MED ORDER — 0.9 % SODIUM CHLORIDE (POUR BTL) OPTIME
TOPICAL | Status: DC | PRN
  Administered 2013-05-15: 1000 mL

## 2013-05-15 MED ORDER — LIDOCAINE HCL (CARDIAC) 10 MG/ML IV SOLN
INTRAVENOUS | Status: DC | PRN
  Administered 2013-05-15: 10 mg via INTRAVENOUS

## 2013-05-15 MED ORDER — HEMOSTATIC AGENTS (NO CHARGE) OPTIME
TOPICAL | Status: DC | PRN
  Administered 2013-05-15: 1 via TOPICAL

## 2013-05-15 MED ORDER — DEXAMETHASONE SODIUM PHOSPHATE 4 MG/ML IJ SOLN
4.0000 mg | Freq: Once | INTRAMUSCULAR | Status: AC
  Administered 2013-05-15: 4 mg via INTRAVENOUS
  Filled 2013-05-15: qty 1

## 2013-05-15 MED ORDER — FENTANYL CITRATE 0.05 MG/ML IJ SOLN
INTRAMUSCULAR | Status: AC
  Filled 2013-05-15: qty 2

## 2013-05-15 MED ORDER — PROPOFOL 10 MG/ML IV BOLUS
INTRAVENOUS | Status: DC | PRN
  Administered 2013-05-15: 140 mg via INTRAVENOUS
  Administered 2013-05-15: 30 mg via INTRAVENOUS

## 2013-05-15 MED ORDER — ROCURONIUM BROMIDE 100 MG/10ML IV SOLN
INTRAVENOUS | Status: DC | PRN
  Administered 2013-05-15: 30 mg via INTRAVENOUS
  Administered 2013-05-15: 5 mg via INTRAVENOUS

## 2013-05-15 MED ORDER — BUPIVACAINE HCL (PF) 0.5 % IJ SOLN
INTRAMUSCULAR | Status: AC
  Filled 2013-05-15: qty 30

## 2013-05-15 MED ORDER — KETOROLAC TROMETHAMINE 30 MG/ML IJ SOLN
INTRAMUSCULAR | Status: AC
  Filled 2013-05-15: qty 1

## 2013-05-15 MED ORDER — LACTATED RINGERS IV SOLN
INTRAVENOUS | Status: DC
  Administered 2013-05-15: 09:00:00 via INTRAVENOUS

## 2013-05-15 MED ORDER — FENTANYL CITRATE 0.05 MG/ML IJ SOLN
INTRAMUSCULAR | Status: DC | PRN
  Administered 2013-05-15 (×5): 50 ug via INTRAVENOUS

## 2013-05-15 MED ORDER — POVIDONE-IODINE 10 % EX OINT
TOPICAL_OINTMENT | CUTANEOUS | Status: DC | PRN
  Administered 2013-05-15: 1 via TOPICAL

## 2013-05-15 SURGICAL SUPPLY — 50 items
APPLICATOR COTTON TIP 6IN STRL (MISCELLANEOUS) ×1 IMPLANT
APPLIER CLIP LAPSCP 10X32 DD (CLIP) ×2 IMPLANT
BAG HAMPER (MISCELLANEOUS) ×2 IMPLANT
BAG SPEC RTRVL LRG 6X4 10 (ENDOMECHANICALS) ×1
CATH CHOLANGIOGRAM 4.5FR (CATHETERS) ×2 IMPLANT
CLOTH BEACON ORANGE TIMEOUT ST (SAFETY) ×2 IMPLANT
COVER LIGHT HANDLE STERIS (MISCELLANEOUS) ×4 IMPLANT
COVER MAYO STAND XLG (DRAPE) ×2 IMPLANT
DECANTER SPIKE VIAL GLASS SM (MISCELLANEOUS) ×2 IMPLANT
DISSECTOR BLUNT TIP ENDO 5MM (MISCELLANEOUS) IMPLANT
DRAPE C-ARM FOLDED MOBILE STRL (DRAPES) ×2 IMPLANT
DURAPREP 26ML APPLICATOR (WOUND CARE) ×2 IMPLANT
ELECT REM PT RETURN 9FT ADLT (ELECTROSURGICAL) ×2
ELECTRODE REM PT RTRN 9FT ADLT (ELECTROSURGICAL) ×1 IMPLANT
FILTER SMOKE EVAC LAPAROSHD (FILTER) ×2 IMPLANT
FORMALIN 10 PREFIL 120ML (MISCELLANEOUS) ×2 IMPLANT
GLOVE BIO SURGEON STRL SZ7.5 (GLOVE) ×2 IMPLANT
GLOVE BIOGEL PI IND STRL 7.0 (GLOVE) IMPLANT
GLOVE BIOGEL PI IND STRL 8 (GLOVE) ×1 IMPLANT
GLOVE BIOGEL PI INDICATOR 7.0 (GLOVE) ×3
GLOVE BIOGEL PI INDICATOR 8 (GLOVE) ×1
GLOVE ECLIPSE 7.0 STRL STRAW (GLOVE) ×1 IMPLANT
GLOVE SS BIOGEL STRL SZ 6.5 (GLOVE) IMPLANT
GLOVE SUPERSENSE BIOGEL SZ 6.5 (GLOVE) ×1
GOWN STRL REUS W/TWL LRG LVL3 (GOWN DISPOSABLE) ×6 IMPLANT
HEMOSTAT SNOW SURGICEL 2X4 (HEMOSTASIS) ×2 IMPLANT
INST SET LAPROSCOPIC AP (KITS) ×2 IMPLANT
KIT ROOM TURNOVER APOR (KITS) ×2 IMPLANT
MANIFOLD NEPTUNE II (INSTRUMENTS) ×2 IMPLANT
NEEDLE INSUFFLATION 120MM (ENDOMECHANICALS) ×2 IMPLANT
NS IRRIG 1000ML POUR BTL (IV SOLUTION) ×2 IMPLANT
PACK LAP CHOLE LZT030E (CUSTOM PROCEDURE TRAY) ×2 IMPLANT
PAD ARMBOARD 7.5X6 YLW CONV (MISCELLANEOUS) ×2 IMPLANT
POUCH SPECIMEN RETRIEVAL 10MM (ENDOMECHANICALS) ×2 IMPLANT
SET BASIN LINEN APH (SET/KITS/TRAYS/PACK) ×2 IMPLANT
SET TUBE IRRIG SUCTION NO TIP (IRRIGATION / IRRIGATOR) IMPLANT
SLEEVE ENDOPATH XCEL 5M (ENDOMECHANICALS) ×2 IMPLANT
SPONGE GAUZE 2X2 8PLY STRL LF (GAUZE/BANDAGES/DRESSINGS) ×8 IMPLANT
STAPLER VISISTAT (STAPLE) ×2 IMPLANT
STAPLER VISISTAT 35W (STAPLE) IMPLANT
SUT VICRYL 0 UR6 27IN ABS (SUTURE) ×2 IMPLANT
SYR 20CC LL (SYRINGE) ×2 IMPLANT
SYR 30ML LL (SYRINGE) ×2 IMPLANT
TAPE CLOTH SURG 4X10 WHT LF (GAUZE/BANDAGES/DRESSINGS) ×2 IMPLANT
TROCAR ENDO BLADELESS 11MM (ENDOMECHANICALS) ×2 IMPLANT
TROCAR XCEL NON-BLD 5MMX100MML (ENDOMECHANICALS) ×2 IMPLANT
TROCAR XCEL UNIV SLVE 11M 100M (ENDOMECHANICALS) ×2 IMPLANT
TUBING INSUFFLATION (TUBING) ×2 IMPLANT
WARMER LAPAROSCOPE (MISCELLANEOUS) ×2 IMPLANT
YANKAUER SUCT 12FT TUBE ARGYLE (SUCTIONS) ×2 IMPLANT

## 2013-05-15 NOTE — Interval H&P Note (Signed)
History and Physical Interval Note:  05/15/2013 9:54 AM  Kerry Richardson  has presented today for surgery, with the diagnosis of Cholelithiasis  The various methods of treatment have been discussed with the patient and family. After consideration of risks, benefits and other options for treatment, the patient has consented to  Procedure(s): LAPAROSCOPIC CHOLECYSTECTOMY WITH INTRAOPERATIVE CHOLANGIOGRAM (N/A) as a surgical intervention .  The patient's history has been reviewed, patient examined, no change in status, stable for surgery.  I have reviewed the patient's chart and labs.  Questions were answered to the patient's satisfaction.     Franky MachoJENKINS,Lealand Elting A

## 2013-05-15 NOTE — Anesthesia Procedure Notes (Signed)
Procedure Name: Intubation Date/Time: 05/15/2013 10:29 AM Performed by: Franco NonesYATES, Meklit Cotta S Pre-anesthesia Checklist: Patient identified, Patient being monitored, Timeout performed, Emergency Drugs available and Suction available Patient Re-evaluated:Patient Re-evaluated prior to inductionOxygen Delivery Method: Circle System Utilized Preoxygenation: Pre-oxygenation with 100% oxygen Intubation Type: IV induction Ventilation: Mask ventilation without difficulty Laryngoscope Size: Mac and 3 Grade View: Grade I Tube type: Oral Tube size: 7.0 mm Number of attempts: 1 Airway Equipment and Method: stylet Placement Confirmation: ETT inserted through vocal cords under direct vision,  positive ETCO2 and breath sounds checked- equal and bilateral Secured at: 21 cm Tube secured with: Tape Dental Injury: Teeth and Oropharynx as per pre-operative assessment

## 2013-05-15 NOTE — Transfer of Care (Signed)
Immediate Anesthesia Transfer of Care Note  Patient: Kerry Richardson  Procedure(s) Performed: Procedure(s) (LRB): LAPAROSCOPIC CHOLECYSTECTOMY WITH INTRAOPERATIVE CHOLANGIOGRAM (N/A)  Patient Location: PACU  Anesthesia Type: General  Level of Consciousness: awake  Airway & Oxygen Therapy: Patient Spontanous Breathing and non-rebreather face mask  Post-op Assessment: Report given to PACU RN, Post -op Vital signs reviewed and stable and Patient moving all extremities  Post vital signs: Reviewed and stable  Complications: No apparent anesthesia complications

## 2013-05-15 NOTE — Op Note (Addendum)
Patient:  Vertell LimberRuth S Wainer  DOB:  Jan 25, 1941  MRN:  [id number]   Preop Diagnosis:  Cholecystitis, cholelithiasis   Postop Diagnosis:  Same  Procedure:  Laparoscopic cholecystectomy with cholangiograms  Surgeon:  Franky MachoMark Tyjai Matuszak, M.D.  Anes:  General endotracheal  Indications:  Patient is a 73 year old white female presents with cholecystitis secondary to cholelithiasis. She also has had an elevation in liver enzyme tests as well as a mildly dilated common bile duct. The risks and benefits of the procedure including bleeding, infection, hepatobiliary injury, and the possibility of an open procedure were fully explained to the patient, who gave informed consent.  Procedure note:  Patient is placed the supine position. After induction of general endotracheal anesthesia, the abdomen was prepped and draped using usual sterile technique with DuraPrep. Surgical site confirmation was performed.  A supraumbilical incision was made down to the fascia. A Veress needle was introduced into the abdominal cavity and confirmation of placement was done using the saline drop test. The abdomen was then insufflated to 16 mm mercury pressure. An 11 mm trocar was introduced into the abdominal cavity under direct visualization the difficulty. The patient was placed in reverse Trendelenburg position and an additional 11 mm trocar was placed the epigastric region and 5 mm trochars were placed in the right upper quadrant and right flank regions. The liver was inspected and noted within normal limits. The gallbladder was retracted in a dynamic fashion in order to expose the triangle of Calot. The cystic duct was first identified. Its juncture were to the infundibulum was fully identified. A single endoclip was placed proximally on the cystic duct.  A cholangiocatheter was then inserted.  Under digital fluroscopy, a cholangiogram was performed.  No filling defects were noted.  Dye flowed freely into the duodenum.  The system was  then flushed with normal saline.  Endoclips were then placed distally on the cystic duct, and the cystic duct was divided. This was likewise done the cystic artery. The gallbladder was then freed away from the gallbladder fossa using Bovie electrocautery. The gallbladder was delivered through the epigastric trocar site using an Endo Catch bag. The gallbladder fossa was inspected and no abnormal bleeding or bile leakage was noted. Surgicel is placed the gallbladder fossa. All fluid and air were then evacuated from the abdominal cavity prior to removal of the trochars.  All wounds were irrigated with normal saline. All wounds were injected with 0.5% Sensorcaine. The supraumbilical fascia was reapproximated using 0 Vicryl interrupted suture. All skin incisions were closed using staples. Betadine ointment and dry sterile dressings were applied.  All tape and needle counts were correct at the end of the procedure. Patient was extubated in the operating room and transferred to PACU in stable condition.  Complications:  None  EBL:  Minimal  Specimen:  Gallbladder

## 2013-05-15 NOTE — Discharge Instructions (Signed)
Laparoscopic Cholecystectomy, Care After °Refer to this sheet in the next few weeks. These instructions provide you with information on caring for yourself after your procedure. Your health care provider may also give you more specific instructions. Your treatment has been planned according to current medical practices, but problems sometimes occur. Call your health care provider if you have any problems or questions after your procedure. °WHAT TO EXPECT AFTER THE PROCEDURE °After your procedure, it is typical to have the following: °· Pain at your incision sites. You will be given pain medicines to control the pain. °· Mild nausea or vomiting. This should improve after the first 24 hours. °· Bloating and possibly shoulder pain from the gas used during the procedure. This will improve after the first 24 hours. °HOME CARE INSTRUCTIONS  °· Change bandages (dressings) as directed by your health care provider. °· Keep the wound dry and clean. You may wash the wound gently with soap and water. Gently blot or dab the area dry. °· Do not take baths or use swimming pools or hot tubs for 2 weeks or until your health care provider approves. °· Only take over-the-counter or prescription medicines as directed by your health care provider. °· Continue your normal diet as directed by your health care provider. °· Do not lift anything heavier than 10 pounds (4.5 kg) until your health care provider approves. °· Do not play contact sports for 1 week or until your health care provider approves. °SEEK MEDICAL CARE IF:  °· You have redness, swelling, or increasing pain in the wound. °· You notice yellowish-white fluid (pus) coming from the wound. °· You have drainage from the wound that lasts longer than 1 day. °· You notice a bad smell coming from the wound or dressing. °· Your surgical cuts (incisions) break open. °SEEK IMMEDIATE MEDICAL CARE IF:  °· You develop a rash. °· You have difficulty breathing. °· You have chest pain. °· You  have a fever. °· You have increasing pain in the shoulders (shoulder strap areas). °· You have dizzy episodes or faint while standing. °· You have severe abdominal pain. °· You feel sick to your stomach (nauseous) or throw up (vomit) and this lasts for more than 1 day. °Document Released: 03/09/2005 Document Revised: 12/28/2012 Document Reviewed: 10/19/2012 °[employer]® Patient Information ©2014 [employer]. ° °

## 2013-05-15 NOTE — Anesthesia Postprocedure Evaluation (Signed)
Anesthesia Post Note  Patient: Kerry Richardson  Procedure(s) Performed: Procedure(s) (LRB): LAPAROSCOPIC CHOLECYSTECTOMY WITH INTRAOPERATIVE CHOLANGIOGRAM (N/A)  Anesthesia type: General  Patient location: PACU  Post pain: Pain level moderate  Post assessment: Post-op Vital signs reviewed, Patient's Cardiovascular Status Stable, Respiratory Function Stable, Patent Airway, No signs of Nausea or vomiting and Pain level controlled  Last Vitals:  Filed Vitals:   05/15/13 1123  BP: 151/34  Pulse: 70  Temp: 36.5 C  Resp: 20    Post vital signs: Reviewed and stable  Level of consciousness: awake and alert   Complications: No apparent anesthesia complications

## 2013-05-15 NOTE — Anesthesia Preprocedure Evaluation (Addendum)
Anesthesia Evaluation  Patient identified by MRN, date of birth, ID band Patient awake    Reviewed: Allergy & Precautions, H&P , NPO status , Patient's Chart, lab work & pertinent test results  History of Anesthesia Complications (+) PONV and history of anesthetic complications  Airway Mallampati: I TM Distance: >3 FB Neck ROM: Limited    Dental  (+) Teeth Intact   Pulmonary neg pulmonary ROS,  breath sounds clear to auscultation        Cardiovascular negative cardio ROS  Rhythm:Regular Rate:Normal     Neuro/Psych    GI/Hepatic negative GI ROS,   Endo/Other    Renal/GU      Musculoskeletal  (+) Arthritis - (spinal stenosis, chronic LBP, cervical fusion), Osteoarthritis,    Abdominal   Peds  Hematology   Anesthesia Other Findings   Reproductive/Obstetrics                           Anesthesia Physical Anesthesia Plan  ASA: II  Anesthesia Plan: General   Post-op Pain Management:    Induction: Intravenous  Airway Management Planned: Oral ETT  Additional Equipment:   Intra-op Plan:   Post-operative Plan: Extubation in OR  Informed Consent: I have reviewed the patients History and Physical, chart, labs and discussed the procedure including the risks, benefits and alternatives for the proposed anesthesia with the patient or authorized representative who has indicated his/her understanding and acceptance.     Plan Discussed with:   Anesthesia Plan Comments:        Anesthesia Quick Evaluation

## 2013-05-16 ENCOUNTER — Encounter (HOSPITAL_COMMUNITY): Payer: Self-pay | Admitting: General Surgery

## 2013-07-04 ENCOUNTER — Other Ambulatory Visit: Payer: Self-pay | Admitting: *Deleted

## 2013-07-04 DIAGNOSIS — M5430 Sciatica, unspecified side: Secondary | ICD-10-CM

## 2013-07-04 MED ORDER — GABAPENTIN 100 MG PO CAPS: 100.0000 mg | ORAL_CAPSULE | Freq: Three times a day (TID) | ORAL | Status: DC

## 2013-07-09 ENCOUNTER — Inpatient Hospital Stay (HOSPITAL_COMMUNITY): Payer: Medicare Other

## 2013-07-09 ENCOUNTER — Inpatient Hospital Stay (HOSPITAL_COMMUNITY)
Admission: EM | Admit: 2013-07-09 | Discharge: 2013-07-15 | DRG: 438 | Disposition: A | Discharge status: Home / Self Care | Payer: Medicare Other | Attending: Internal Medicine | Admitting: Internal Medicine

## 2013-07-09 ENCOUNTER — Emergency Department (HOSPITAL_COMMUNITY): Payer: Medicare Other

## 2013-07-09 ENCOUNTER — Encounter (HOSPITAL_COMMUNITY): Payer: Self-pay | Admitting: Emergency Medicine

## 2013-07-09 DIAGNOSIS — R0600 Dyspnea, unspecified: Secondary | ICD-10-CM | POA: Diagnosis present

## 2013-07-09 DIAGNOSIS — M1710 Unilateral primary osteoarthritis, unspecified knee: Secondary | ICD-10-CM

## 2013-07-09 DIAGNOSIS — E876 Hypokalemia: Secondary | ICD-10-CM

## 2013-07-09 DIAGNOSIS — E871 Hypo-osmolality and hyponatremia: Secondary | ICD-10-CM

## 2013-07-09 DIAGNOSIS — M4800 Spinal stenosis, site unspecified: Secondary | ICD-10-CM

## 2013-07-09 DIAGNOSIS — Z9889 Other specified postprocedural states: Secondary | ICD-10-CM

## 2013-07-09 DIAGNOSIS — D72829 Elevated white blood cell count, unspecified: Secondary | ICD-10-CM

## 2013-07-09 DIAGNOSIS — R0609 Other forms of dyspnea: Secondary | ICD-10-CM

## 2013-07-09 DIAGNOSIS — I10 Essential (primary) hypertension: Secondary | ICD-10-CM

## 2013-07-09 DIAGNOSIS — M179 Osteoarthritis of knee, unspecified: Secondary | ICD-10-CM

## 2013-07-09 DIAGNOSIS — I509 Heart failure, unspecified: Secondary | ICD-10-CM

## 2013-07-09 DIAGNOSIS — E785 Hyperlipidemia, unspecified: Secondary | ICD-10-CM

## 2013-07-09 DIAGNOSIS — K589 Irritable bowel syndrome without diarrhea: Secondary | ICD-10-CM | POA: Diagnosis present

## 2013-07-09 DIAGNOSIS — Z9089 Acquired absence of other organs: Secondary | ICD-10-CM

## 2013-07-09 DIAGNOSIS — M5126 Other intervertebral disc displacement, lumbar region: Secondary | ICD-10-CM

## 2013-07-09 DIAGNOSIS — M19049 Primary osteoarthritis, unspecified hand: Secondary | ICD-10-CM

## 2013-07-09 DIAGNOSIS — Z981 Arthrodesis status: Secondary | ICD-10-CM

## 2013-07-09 DIAGNOSIS — Z8249 Family history of ischemic heart disease and other diseases of the circulatory system: Secondary | ICD-10-CM

## 2013-07-09 DIAGNOSIS — K219 Gastro-esophageal reflux disease without esophagitis: Secondary | ICD-10-CM | POA: Diagnosis present

## 2013-07-09 DIAGNOSIS — R0902 Hypoxemia: Secondary | ICD-10-CM

## 2013-07-09 DIAGNOSIS — K859 Acute pancreatitis without necrosis or infection, unspecified: Principal | ICD-10-CM | POA: Diagnosis present

## 2013-07-09 DIAGNOSIS — M129 Arthropathy, unspecified: Secondary | ICD-10-CM | POA: Diagnosis present

## 2013-07-09 DIAGNOSIS — R0989 Other specified symptoms and signs involving the circulatory and respiratory systems: Secondary | ICD-10-CM

## 2013-07-09 DIAGNOSIS — M5430 Sciatica, unspecified side: Secondary | ICD-10-CM

## 2013-07-09 DIAGNOSIS — M76899 Other specified enthesopathies of unspecified lower limb, excluding foot: Secondary | ICD-10-CM

## 2013-07-09 DIAGNOSIS — I5031 Acute diastolic (congestive) heart failure: Secondary | ICD-10-CM

## 2013-07-09 DIAGNOSIS — Q762 Congenital spondylolisthesis: Secondary | ICD-10-CM

## 2013-07-09 DIAGNOSIS — M25561 Pain in right knee: Secondary | ICD-10-CM

## 2013-07-09 DIAGNOSIS — J9600 Acute respiratory failure, unspecified whether with hypoxia or hypercapnia: Secondary | ICD-10-CM

## 2013-07-09 DIAGNOSIS — M23329 Other meniscus derangements, posterior horn of medial meniscus, unspecified knee: Secondary | ICD-10-CM

## 2013-07-09 LAB — LIPID PANEL
CHOL/HDL RATIO: 3.2 ratio
CHOLESTEROL: 277 mg/dL — AB (ref 0–200)
HDL: 87 mg/dL (ref >39)
LDL Cholesterol: 160 mg/dL — ABNORMAL HIGH (ref 0–99)
Triglycerides: 150 mg/dL — ABNORMAL HIGH (ref <150)
VLDL: 30 mg/dL (ref 0–40)

## 2013-07-09 LAB — COMPREHENSIVE METABOLIC PANEL
ALT: 13 U/L (ref 0–35)
AST: 15 U/L (ref 0–37)
Albumin: 3.7 g/dL (ref 3.5–5.2)
Alkaline Phosphatase: 87 U/L (ref 39–117)
BUN: 16 mg/dL (ref 6–23)
CO2: 26 meq/L (ref 19–32)
CREATININE: 0.69 mg/dL (ref 0.50–1.10)
Calcium: 9.6 mg/dL (ref 8.4–10.5)
Chloride: 99 mEq/L (ref 96–112)
GFR, EST NON AFRICAN AMERICAN: 84 mL/min — AB (ref >90)
GLUCOSE: 157 mg/dL — AB (ref 70–99)
Potassium: 3.6 mEq/L — ABNORMAL LOW (ref 3.7–5.3)
Sodium: 140 mEq/L (ref 137–147)
Total Bilirubin: 0.2 mg/dL — ABNORMAL LOW (ref 0.3–1.2)
Total Protein: 8 g/dL (ref 6.0–8.3)

## 2013-07-09 LAB — CBC WITH DIFFERENTIAL/PLATELET
BASOS ABS: 0 10*3/uL (ref 0.0–0.1)
Basophils Relative: 0 % (ref 0–1)
EOS PCT: 1 % (ref 0–5)
Eosinophils Absolute: 0.1 10*3/uL (ref 0.0–0.7)
HEMATOCRIT: 43.5 % (ref 36.0–46.0)
Hemoglobin: 14 g/dL (ref 12.0–15.0)
LYMPHS PCT: 19 % (ref 12–46)
Lymphs Abs: 2.5 10*3/uL (ref 0.7–4.0)
MCH: 28.7 pg (ref 26.0–34.0)
MCHC: 32.2 g/dL (ref 30.0–36.0)
MCV: 89.3 fL (ref 78.0–100.0)
MONO ABS: 0.6 10*3/uL (ref 0.1–1.0)
MONOS PCT: 4 % (ref 3–12)
Neutro Abs: 9.6 10*3/uL — ABNORMAL HIGH (ref 1.7–7.7)
Neutrophils Relative %: 76 % (ref 43–77)
Platelets: 342 10*3/uL (ref 150–400)
RBC: 4.87 MIL/uL (ref 3.87–5.11)
RDW: 15.1 % (ref 11.5–15.5)
WBC: 12.8 10*3/uL — AB (ref 4.0–10.5)

## 2013-07-09 LAB — URINALYSIS, ROUTINE W REFLEX MICROSCOPIC
Bilirubin Urine: NEGATIVE
GLUCOSE, UA: NEGATIVE mg/dL
Ketones, ur: NEGATIVE mg/dL
Leukocytes, UA: NEGATIVE
Nitrite: NEGATIVE
PH: 6 (ref 5.0–8.0)
Protein, ur: NEGATIVE mg/dL
Specific Gravity, Urine: 1.01 (ref 1.005–1.030)
UROBILINOGEN UA: 0.2 mg/dL (ref 0.0–1.0)

## 2013-07-09 LAB — TROPONIN I

## 2013-07-09 LAB — URINE MICROSCOPIC-ADD ON

## 2013-07-09 LAB — LIPASE, BLOOD: Lipase: >3000 U/L — ABNORMAL HIGH (ref 11–59)

## 2013-07-09 MED ORDER — SODIUM CHLORIDE 0.9 % IV SOLN: INTRAVENOUS | Status: DC

## 2013-07-09 MED ORDER — ACETAMINOPHEN 325 MG PO TABS: 650.0000 mg | ORAL_TABLET | Freq: Four times a day (QID) | ORAL | Status: DC | PRN

## 2013-07-09 MED ORDER — HYDROMORPHONE HCL PF 1 MG/ML IJ SOLN
1.0000 mg | INTRAMUSCULAR | Status: DC | PRN
  Administered 2013-07-09 – 2013-07-12 (×12): 1 mg via INTRAVENOUS
  Filled 2013-07-09 (×12): qty 1

## 2013-07-09 MED ORDER — ONDANSETRON HCL 4 MG/2ML IJ SOLN
4.0000 mg | Freq: Once | INTRAMUSCULAR | Status: AC
  Administered 2013-07-09: 4 mg via INTRAMUSCULAR
  Filled 2013-07-09: qty 2

## 2013-07-09 MED ORDER — HYDROMORPHONE HCL PF 1 MG/ML IJ SOLN
1.0000 mg | Freq: Once | INTRAMUSCULAR | Status: AC
  Administered 2013-07-09: 1 mg via INTRAVENOUS
  Filled 2013-07-09: qty 1

## 2013-07-09 MED ORDER — IOHEXOL 300 MG/ML  SOLN
100.0000 mL | Freq: Once | INTRAMUSCULAR | Status: AC | PRN
  Administered 2013-07-09: 100 mL via INTRAVENOUS

## 2013-07-09 MED ORDER — SODIUM CHLORIDE 0.9 % IV BOLUS (SEPSIS)
500.0000 mL | Freq: Once | INTRAVENOUS | Status: AC
  Administered 2013-07-09: 500 mL via INTRAVENOUS

## 2013-07-09 MED ORDER — FENTANYL CITRATE 0.05 MG/ML IJ SOLN
50.0000 ug | Freq: Once | INTRAMUSCULAR | Status: AC
  Administered 2013-07-09: 50 ug via INTRAVENOUS
  Filled 2013-07-09: qty 2

## 2013-07-09 MED ORDER — ACETAMINOPHEN 650 MG RE SUPP: 650.0000 mg | Freq: Four times a day (QID) | RECTAL | Status: DC | PRN

## 2013-07-09 MED ORDER — ONDANSETRON HCL 4 MG/2ML IJ SOLN
4.0000 mg | Freq: Four times a day (QID) | INTRAMUSCULAR | Status: DC | PRN
  Administered 2013-07-09 – 2013-07-13 (×7): 4 mg via INTRAVENOUS
  Filled 2013-07-09 (×7): qty 2

## 2013-07-09 MED ORDER — SENNA 8.6 MG PO TABS
1.0000 | ORAL_TABLET | Freq: Two times a day (BID) | ORAL | Status: DC
  Administered 2013-07-09 – 2013-07-12 (×6): 8.6 mg via ORAL
  Filled 2013-07-09 (×8): qty 1

## 2013-07-09 MED ORDER — ONDANSETRON HCL 4 MG PO TABS: 4.0000 mg | ORAL_TABLET | Freq: Four times a day (QID) | ORAL | Status: DC | PRN

## 2013-07-09 MED ORDER — IOHEXOL 300 MG/ML  SOLN
50.0000 mL | Freq: Once | INTRAMUSCULAR | Status: AC | PRN
  Administered 2013-07-09: 50 mL via ORAL

## 2013-07-09 MED ORDER — POTASSIUM CHLORIDE IN NACL 20-0.9 MEQ/L-% IV SOLN
INTRAVENOUS | Status: DC
  Administered 2013-07-09 – 2013-07-10 (×4): via INTRAVENOUS

## 2013-07-09 MED ORDER — DOCUSATE SODIUM 100 MG PO CAPS
100.0000 mg | ORAL_CAPSULE | Freq: Two times a day (BID) | ORAL | Status: DC
  Administered 2013-07-09 – 2013-07-12 (×6): 100 mg via ORAL
  Filled 2013-07-09 (×8): qty 1

## 2013-07-09 MED ORDER — MORPHINE SULFATE 4 MG/ML IJ SOLN
4.0000 mg | Freq: Once | INTRAMUSCULAR | Status: AC
  Administered 2013-07-09: 4 mg via INTRAVENOUS
  Filled 2013-07-09: qty 1

## 2013-07-09 MED ORDER — ENOXAPARIN SODIUM 40 MG/0.4ML ~~LOC~~ SOLN
40.0000 mg | Freq: Every day | SUBCUTANEOUS | Status: DC
  Administered 2013-07-09 – 2013-07-15 (×7): 40 mg via SUBCUTANEOUS
  Filled 2013-07-09 (×7): qty 0.4

## 2013-07-09 NOTE — ED Provider Notes (Signed)
8:59 AM-On recheck pt states she has had a small amount of relief from pain medication.  Discussed imaging results with patient and explained that symptoms may be due to a stone, pancreatitis, or post-surgical complication.  Advised admission for further treatment and evaluation.  She agrees with plan.  Pt states she has never seen a GI to her knowledge.  Her pain is controlled at this time.  I discussed the case with the hospitalist, who agreed to see the patient and admit her for treatment and disposition.  Flint MelterElliott L Welden Hausmann, MD 07/09/13 [phone]

## 2013-07-09 NOTE — ED Notes (Signed)
Pt states pain in upper abd started approx 330 this am.  Pt states she had surgery in Feb for gallbladder, states this is similar pain.

## 2013-07-09 NOTE — Consult Note (Signed)
Reason for Consult: Pancreatitis Referring Physician: Hospitalist  KERRIE TIMM is an 73 y.o. female.  HPI: Patient is a 73 year old white female status post laparoscopic cholecystectomy with intraoperative cholangiograms on 05/15/2013 who was doing well until 3:00 this a.m., when she suddenly had epigastric abdominal pain. She states she had been doing fine since his surgery and this was her first episode of pain. It was associated with nausea. She presented emergency room and labs has revealed a lipase of greater than 3000. Liver enzyme tests are within normal limits. CT scan the abdomen and pelvis reveals acute pancreatitis with fluid in the gallbladder fossa and some perihepatic fluid.  Past Medical History  Diagnosis Date  . Bulging discs   . Bursitis   . Arthritis   . H/O removal of cyst     finger (uncertain of which finger)  . Migraine headache   . Allergy     chronic  . Reflux   . Back pain   . DDD (degenerative disc disease)   . Neck pain   . GERD (gastroesophageal reflux disease)   . Glaucoma   . PONV (postoperative nausea and vomiting)     Past Surgical History  Procedure Laterality Date  . Cervical fusion    . Abdominal hysterectomy    . Foot surgery    . Carpal tunnel release      bilateral  . Knee arthroscopy with medial menisectomy  03/14/2012    Procedure: KNEE ARTHROSCOPY WITH MEDIAL MENISECTOMY;  Surgeon: Carole Civil, MD;  Location: [facility];  Service: Orthopedics;  Laterality: Left;  . Chondroplasty  03/14/2012    Procedure: CHONDROPLASTY;  Surgeon: Carole Civil, MD;  Location: [facility];  Service: Orthopedics;  Laterality: Left;  medial condyle  . Chondroplasty Right 12/23/2012    Procedure: KNEE ARTHROSCOPY WITH CHONDROPLASTY WITH LIMITED DEBRIDMENT;  Surgeon: Carole Civil, MD;  Location: [facility];  Service: Orthopedics;  Laterality: Right;  . Cholecystectomy N/A 05/15/2013    Procedure: LAPAROSCOPIC CHOLECYSTECTOMY WITH INTRAOPERATIVE  CHOLANGIOGRAM;  Surgeon: Jamesetta So, MD;  Location: [facility];  Service: General;  Laterality: N/A;    Family History  Problem Relation Age of Onset  . Hypertension Mother     borderline    Social History:  reports that she has never smoked. She does not have any smokeless tobacco history on file. She reports that she does not drink alcohol or use illicit drugs.  Allergies:  Allergies  Allergen Reactions  . Codeine Nausea Only    Medications: I have reviewed the patient's current medications.  Results for orders placed during the hospital encounter of 07/09/13 (from the past 48 hour(s))  CBC WITH DIFFERENTIAL     Status: Abnormal   Collection Time    07/09/13  5:57 AM      Result Value Ref Range   WBC 12.8 (*) 4.0 - 10.5 K/uL   RBC 4.87  3.87 - 5.11 MIL/uL   Hemoglobin 14.0  12.0 - 15.0 g/dL   HCT 43.5  36.0 - 46.0 %   MCV 89.3  78.0 - 100.0 fL   MCH 28.7  26.0 - 34.0 pg   MCHC 32.2  30.0 - 36.0 g/dL   RDW 15.1  11.5 - 15.5 %   Platelets 342  150 - 400 K/uL   Neutrophils Relative % 76  43 - 77 %   Neutro Abs 9.6 (*) 1.7 - 7.7 K/uL   Lymphocytes Relative 19  12 - 46 %  Lymphs Abs 2.5  0.7 - 4.0 K/uL   Monocytes Relative 4  3 - 12 %   Monocytes Absolute 0.6  0.1 - 1.0 K/uL   Eosinophils Relative 1  0 - 5 %   Eosinophils Absolute 0.1  0.0 - 0.7 K/uL   Basophils Relative 0  0 - 1 %   Basophils Absolute 0.0  0.0 - 0.1 K/uL  COMPREHENSIVE METABOLIC PANEL     Status: Abnormal   Collection Time    07/09/13  5:57 AM      Result Value Ref Range   Sodium 140  137 - 147 mEq/L   Potassium 3.6 (*) 3.7 - 5.3 mEq/L   Chloride 99  96 - 112 mEq/L   CO2 26  19 - 32 mEq/L   Glucose, Bld 157 (*) 70 - 99 mg/dL   BUN 16  6 - 23 mg/dL   Creatinine, Ser 0.69  0.50 - 1.10 mg/dL   Calcium 9.6  8.4 - 10.5 mg/dL   Total Protein 8.0  6.0 - 8.3 g/dL   Albumin 3.7  3.5 - 5.2 g/dL   AST 15  0 - 37 U/L   ALT 13  0 - 35 U/L   Alkaline Phosphatase 87  39 - 117 U/L   Total Bilirubin 0.2 (*)  0.3 - 1.2 mg/dL   GFR calc non Af Amer 84 (*) >90 mL/min   GFR calc Af Amer >90  >90 mL/min   Comment: (NOTE)     The eGFR has been calculated using the CKD EPI equation.     This calculation has not been validated in all clinical situations.     eGFR's persistently <90 mL/min signify possible Chronic Kidney     Disease.  LIPASE, BLOOD     Status: Abnormal   Collection Time    07/09/13  5:57 AM      Result Value Ref Range   Lipase >3000 (*) 11 - 59 U/L   Comment: RESULTS CONFIRMED BY MANUAL DILUTION  TROPONIN I     Status: None   Collection Time    07/09/13  5:57 AM      Result Value Ref Range   Troponin I <0.30  <0.30 ng/mL   Comment:            Due to the release kinetics of cTnI,     a negative result within the first hours     of the onset of symptoms does not rule out     myocardial infarction with certainty.     If myocardial infarction is still suspected,     repeat the test at appropriate intervals.    Ct Abdomen Pelvis W Contrast  07/09/2013   CLINICAL DATA:  Upper abdominal pain. Recent cholecystectomy 05/15/2013.  EXAM: CT ABDOMEN AND PELVIS WITH CONTRAST  TECHNIQUE: Multidetector CT imaging of the abdomen and pelvis was performed using the standard protocol following bolus administration of intravenous contrast.  CONTRAST:  74m OMNIPAQUE IOHEXOL 300 MG/ML SOLN, 1027mOMNIPAQUE IOHEXOL 300 MG/ML SOLN  COMPARISON:  Ultrasound abdomen 05/08/2013. Intraoperative cholangiogram 05/15/2013.  FINDINGS: Lung bases are clear except for minimal scarring/subsegmental atelectatic change. Trace fluid over the dome of the liver without focal intrahepatic defects. Normal spleen. No renal obstruction. Simple 4 cm left renal cyst projects laterally from midpole.  In the gallbladder fossa there is considerable low attenuation fluid, abnormal given the length of time since surgery. Surgical clips are seen in the region of the cystic duct.  There is no biliary ductal dilatation within limits of  detection. No visible CBD stone. No pancreatic duct dilatation.  There is mild edema surrounding the head of the pancreas and fluid tracking posterior to the body and tail of the pancreas along the anterior margin of Gerota's fascia to the left. Considerations include a small bile leak, pancreatitis, or less likely abscess. ERCP could be helpful in further evaluation to evaluate for a small retained stone or possibly a bile leak. Alternately a hepatobiliary scan could assess for abnormal accumulation of radiotracer outside the biliary tree.  No appendiceal inflammation. Mild stool burden. No pelvic masses. Lumbar degenerative disc disease. No worrisome osseous lesions. Tiny fat containing umbilical hernia. No adenopathy.  IMPRESSION: Abnormal fluid accumulation in the gallbladder fossa, around the head of the pancreas, and tracking leftward along the body and tail of the pancreas. Pancreatitis versus bile leak are the more likely considerations. Postoperative abscess is less favored. Correlate clinically.   Electronically Signed   By: Rolla Flatten M.D.   On: 07/09/2013 08:20    ROS: See chart Blood pressure 143/83, pulse 71, temperature 97.7 F (36.5 C), temperature source Oral, resp. rate 18, height '5\' 1"'  (1.549 m), weight 90.266 kg (199 lb), SpO2 93.00%. Physical Exam: Pleasant white female who appears uncomfortable. Abdomen is soft, slightly distended. No specific rigidity is noted. Nonspecific tenderness is noted in the upper abdomen.  Assessment/Plan: Impression: Acute pancreatitis, status post laparoscopic cholecystectomy with cholangiograms. Given the acute onset of her pain and lack of symptoms since her cholecystectomy performed to almost 2 months ago, bile leak is unlikely. Cholangiograms at the time of surgery were negative. I agree that this is probably medication related or other idiopathic etiology. No need for acute surgical intervention. May have clear liquid diet. We'll follow peripherally  with you.  Jamesetta So 07/09/2013, 12:06 PM

## 2013-07-09 NOTE — Progress Notes (Signed)
Called to get report, but Nurse unavailable. Will call back after progression.

## 2013-07-09 NOTE — ED Provider Notes (Signed)
CSN: [id number]     Arrival date & time 07/09/13  0503 History   First MD Initiated Contact with Patient 07/09/13 [phone]     Chief Complaint  Patient presents with  . Abdominal Pain     (Consider location/radiation/quality/duration/timing/severity/associated sxs/prior Treatment) HPI Patient states that she had her gallbladder removed 2 months ago. She presents with epigastric abdominal pain that woke her from sleep at 3:30 AM. Is associated with nausea but with no vomiting. She states the pain is very similar to her "gallbladder-type" pain. She denies any chest pain or shortness of breath. She last had a bowel movement yesterday. She states it was normal. She has had some mild overall abdominal distention. Past Medical History  Diagnosis Date  . Bulging discs   . Bursitis   . Arthritis   . H/O removal of cyst     finger (uncertain of which finger)  . Migraine headache   . Allergy     chronic  . Reflux   . Back pain   . DDD (degenerative disc disease)   . Neck pain   . GERD (gastroesophageal reflux disease)   . Glaucoma   . PONV (postoperative nausea and vomiting)    Past Surgical History  Procedure Laterality Date  . Cervical fusion    . Abdominal hysterectomy    . Foot surgery    . Carpal tunnel release      bilateral  . Knee arthroscopy with medial menisectomy  03/14/2012    Procedure: KNEE ARTHROSCOPY WITH MEDIAL MENISECTOMY;  Surgeon: Vickki HearingStanley E Harrison, MD;  Location: [facility];  Service: Orthopedics;  Laterality: Left;  . Chondroplasty  03/14/2012    Procedure: CHONDROPLASTY;  Surgeon: Vickki HearingStanley E Harrison, MD;  Location: [facility];  Service: Orthopedics;  Laterality: Left;  medial condyle  . Chondroplasty Right 12/23/2012    Procedure: KNEE ARTHROSCOPY WITH CHONDROPLASTY WITH LIMITED DEBRIDMENT;  Surgeon: Vickki HearingStanley E Harrison, MD;  Location: [facility];  Service: Orthopedics;  Laterality: Right;  . Cholecystectomy N/A 05/15/2013    Procedure: LAPAROSCOPIC CHOLECYSTECTOMY WITH  INTRAOPERATIVE CHOLANGIOGRAM;  Surgeon: Dalia HeadingMark A Jenkins, MD;  Location: [facility];  Service: General;  Laterality: N/A;   Family History  Problem Relation Age of Onset  . Hypertension Mother     borderline   History  Substance Use Topics  . Smoking status: Never Smoker   . Smokeless tobacco: Not on file  . Alcohol Use: No   OB History   Grav Para Term Preterm Abortions TAB SAB Ect Mult Living                 Review of Systems  Constitutional: Negative for fever and chills.  Respiratory: Negative for cough and shortness of breath.   Cardiovascular: Negative for chest pain.  Gastrointestinal: Positive for nausea, abdominal pain and abdominal distention. Negative for vomiting, diarrhea and constipation.  Genitourinary: Negative for dysuria, frequency and flank pain.  Musculoskeletal: Negative for back pain, neck pain and neck stiffness.  Skin: Negative for rash and wound.  Neurological: Negative for dizziness, syncope, weakness, numbness and headaches.  All other systems reviewed and are negative.     Allergies  Codeine  Home Medications   Prior to Admission medications   Medication Sig Start Date End Date Taking? Authorizing Provider  Biotin 5000 MCG TABS Take 1 tablet by mouth daily.   Yes Historical Provider, MD  Cholecalciferol (VITAMIN D) 2000 UNITS CAPS Take 1 capsule by mouth daily.   Yes Historical Provider, MD  estrogens,  conjugated, (PREMARIN) 0.3 MG tablet Take 1 tablet (0.3 mg total) by mouth daily. 10/05/12  Yes Merlyn AlbertWilliam S Luking, MD  gabapentin (NEURONTIN) 100 MG capsule Take 1 capsule (100 mg total) by mouth 3 (three) times daily. 07/04/13  Yes Vickki HearingStanley E Harrison, MD  ibuprofen (ADVIL,MOTRIN) 200 MG tablet Take 400 mg by mouth every 6 (six) hours as needed. pain   Yes Historical Provider, MD  ciprofloxacin (CIPRO) 500 MG tablet Take 1 tablet (500 mg total) by mouth 2 (two) times daily. 05/08/13   Flint MelterElliott L Wentz, MD  HYDROcodone-acetaminophen (NORCO/VICODIN) 5-325 MG  per tablet Take 1 tablet by mouth every 6 (six) hours as needed for moderate pain. 05/15/13 05/15/14  Dalia HeadingMark A Jenkins, MD  ketoconazole (NIZORAL) 2 % cream APPLY TO AFFECTED AREAS TWICE DAILY. 05/12/13   Babs SciaraScott A Luking, MD  Magnesium 250 MG TABS Take 250 mg by mouth 2 (two) times daily.    Historical Provider, MD   BP 151/64  Pulse 88  Temp(Src) 98.4 F (36.9 C) (Oral)  Resp 20  Ht 5\' 1"  (1.549 m)  Wt 200 lb (90.719 kg)  BMI 37.81 kg/m2  SpO2 95% Physical Exam  Nursing note and vitals reviewed. Constitutional: She is oriented to person, place, and time. She appears well-developed and well-nourished. She appears distressed.  Patient appears uncomfortable.  HENT:  Head: Normocephalic and atraumatic.  Mouth/Throat: Oropharynx is clear and moist.  Eyes: EOM are normal. Pupils are equal, round, and reactive to light.  Neck: Normal range of motion. Neck supple.  Cardiovascular: Normal rate and regular rhythm.   Pulmonary/Chest: Effort normal and breath sounds normal. No respiratory distress. She has no wheezes. She has no rales. She exhibits no tenderness.  Abdominal: Soft. She exhibits distension (diffuse abdominal distention.). She exhibits no mass. There is tenderness (epigastric tenderness to palpation.). There is no rebound and no guarding.  Decreased bowel sounds throughout.  Musculoskeletal: Normal range of motion. She exhibits no edema and no tenderness.  No CVA tenderness.  Neurological: She is alert and oriented to person, place, and time.  Moves all extremities without deficit. Sensation is grossly intact.  Skin: Skin is warm and dry. No rash noted. No erythema.  Psychiatric: She has a normal mood and affect. Her behavior is normal.    ED Course  Procedures (including critical care time) Labs Review Labs Reviewed  CBC WITH DIFFERENTIAL - Abnormal; Notable for the following:    WBC 12.8 (*)    Neutro Abs 9.6 (*)    All other components within normal limits  COMPREHENSIVE  METABOLIC PANEL - Abnormal; Notable for the following:    Potassium 3.6 (*)    Glucose, Bld 157 (*)    Total Bilirubin 0.2 (*)    GFR calc non Af Amer 84 (*)    All other components within normal limits  LIPASE, BLOOD - Abnormal; Notable for the following:    Lipase >3000 (*)    All other components within normal limits  URINALYSIS, ROUTINE W REFLEX MICROSCOPIC - Abnormal; Notable for the following:    Hgb urine dipstick TRACE (*)    All other components within normal limits  LIPID PANEL - Abnormal; Notable for the following:    Cholesterol 277 (*)    Triglycerides 150 (*)    LDL Cholesterol 160 (*)    All other components within normal limits  COMPREHENSIVE METABOLIC PANEL - Abnormal; Notable for the following:    Glucose, Bld 109 (*)    Calcium 8.2 (*)  Albumin 3.1 (*)    GFR calc non Af Amer 86 (*)    All other components within normal limits  CBC - Abnormal; Notable for the following:    WBC 20.8 (*)    RDW 15.7 (*)    All other components within normal limits  COMPREHENSIVE METABOLIC PANEL - Abnormal; Notable for the following:    Glucose, Bld 112 (*)    Calcium 8.3 (*)    Albumin 2.9 (*)    All other components within normal limits  CBC - Abnormal; Notable for the following:    WBC 20.7 (*)    Hemoglobin 11.8 (*)    RDW 15.9 (*)    All other components within normal limits  PRO B NATRIURETIC PEPTIDE - Abnormal; Notable for the following:    Pro B Natriuretic peptide (BNP) 709.3 (*)    All other components within normal limits  CBC - Abnormal; Notable for the following:    WBC 18.9 (*)    Hemoglobin 11.3 (*)    HCT 34.4 (*)    All other components within normal limits  BASIC METABOLIC PANEL - Abnormal; Notable for the following:    Potassium 3.0 (*)    Glucose, Bld 116 (*)    BUN 5 (*)    Calcium 8.3 (*)    All other components within normal limits  HEPATIC FUNCTION PANEL - Abnormal; Notable for the following:    Albumin 3.0 (*)    All other components  within normal limits  CBC - Abnormal; Notable for the following:    WBC 15.8 (*)    RBC 3.69 (*)    Hemoglobin 10.7 (*)    HCT 32.5 (*)    All other components within normal limits  BASIC METABOLIC PANEL - Abnormal; Notable for the following:    Sodium 136 (*)    Potassium 3.5 (*)    Glucose, Bld 111 (*)    BUN 5 (*)    All other components within normal limits  BASIC METABOLIC PANEL - Abnormal; Notable for the following:    Sodium 135 (*)    Potassium 3.4 (*)    Chloride 94 (*)    Glucose, Bld 108 (*)    Creatinine, Ser 0.48 (*)    All other components within normal limits  CBC - Abnormal; Notable for the following:    WBC 16.8 (*)    RBC 3.78 (*)    Hemoglobin 10.8 (*)    HCT 33.3 (*)    All other components within normal limits  CBC - Abnormal; Notable for the following:    WBC 16.0 (*)    Hemoglobin 11.4 (*)    HCT 34.4 (*)    All other components within normal limits  BASIC METABOLIC PANEL - Abnormal; Notable for the following:    Glucose, Bld 132 (*)    Creatinine, Ser 0.41 (*)    All other components within normal limits  URINE CULTURE  CULTURE, BLOOD (ROUTINE X 2)  CULTURE, BLOOD (ROUTINE X 2)  CLOSTRIDIUM DIFFICILE BY PCR  CLOSTRIDIUM DIFFICILE BY PCR  TROPONIN I  URINE MICROSCOPIC-ADD ON  LIPASE, BLOOD  MAGNESIUM    Imaging Review No results found.   EKG Interpretation   Date/Time:  Sunday July 09 2013 06:15:11 EDT Ventricular Rate:  65 PR Interval:  166 QRS Duration: 94 QT Interval:  426 QTC Calculation: 443 R Axis:   -34 Text Interpretation:  Normal sinus rhythm Left axis deviation Abnormal ECG  When compared  with ECG of 08-May-2013 06:24, No significant change was  found ED PHYSICIAN INTERPRETATION AVAILABLE IN [facility] Confirmed  by TEST, Record ([id number]) on 07/11/2013 7:09:25 AM      Date: 07/09/2013  Rate: 65  Rhythm: normal sinus rhythm  QRS Axis: normal  Intervals: normal  ST/T Wave abnormalities: normal  Conduction  Disutrbances:none  Narrative Interpretation:   Old EKG Reviewed: none available   MDM   Final diagnoses:  Pancreatitis    Question retained gallstone versus abdominal instruction.  Discuss with hospitalist and will admit.  Loren Racer, MD 07/18/13 [phone]

## 2013-07-09 NOTE — H&P (Signed)
History and Physical  Kerry Richardson XBM:[id number] DOB: Oct 01, 1940 DOA: 07/09/2013   PCP: Harlow Asa, MD   Chief Complaint: Abdominal pain  HPI:  73 year old female with a history of cholecystitis status post laparoscopic cholecystectomy on 05/15/2013 (Dr. Lovell Sheehan) presents with acute onset of abdominal pain that woke her up from sleep around 3:30 in the morning on 07/09/2013. The patient did complain of some nausea without any vomiting. The patient has been in her usual state of health without any abdominal pain prior to the morning of admission. She states that she has been eating fine and having normal bowel movements without any hematochezia or melena. She has some subjective fevers with diaphoresis on the morning of admission. She denies any chest pain but states that she has some shortness of breath likely resulting from her epigastric pain. The patient has not had any new medications except for a short course of Norco after her cholecystectomy. She denies any NSAID use. She does not use any over-the-counter supplements. The patient denies any alcohol or illegal drug use. In the emergency department, the patient was given morphine 4 mg, fentanyl 50 mg, and to individual doses of Dilaudid 1 mg each. She was given 1 L normal saline. Labs revealed unremarkable BMP. LFTs were normal. Lipase was greater than 3000. WBC was 12.8. CT of the abdomen and pelvis showed trace amount of fluid on the dome of the liver with low attenuation fluid in the gallbladder fossa. There was mild edema at the head of the pancreas with fluid tracking posteriorly to the body and tail. Assessment/Plan: Acute pancreatitis -Likely related to medications, particularly Premarin although microlithiasis cannot be ruled out -However, patient has no biliary ductal dilatation on CT scan -Check lipids/triglycerides -IV fluids -Bowel rest -Pain control/antiemetics -Discontinue Premarin  Gallbladder fossa fluid  -Suspect that  this inflammatory fluid likely related to her pancreatitis  -Consult Dr. Lovell Sheehan for concerns of possible biloma/bile leak  Dyspnea  -Likely related to the patient's epigastric pain  -EKG  -Chest x-ray  Leukocytosis  -Likely due to the patient's acute medical condition  -Urinalysis  -Continue to monitor        Past Medical History  Diagnosis Date  . Bulging discs   . Bursitis   . Arthritis   . H/O removal of cyst     finger (uncertain of which finger)  . Migraine headache   . Allergy     chronic  . Reflux   . Back pain   . DDD (degenerative disc disease)   . Neck pain   . GERD (gastroesophageal reflux disease)   . Glaucoma   . PONV (postoperative nausea and vomiting)    Past Surgical History  Procedure Laterality Date  . Cervical fusion    . Abdominal hysterectomy    . Foot surgery    . Carpal tunnel release      bilateral  . Knee arthroscopy with medial menisectomy  03/14/2012    Procedure: KNEE ARTHROSCOPY WITH MEDIAL MENISECTOMY;  Surgeon: Vickki Hearing, MD;  Location: [facility];  Service: Orthopedics;  Laterality: Left;  . Chondroplasty  03/14/2012    Procedure: CHONDROPLASTY;  Surgeon: Vickki Hearing, MD;  Location: [facility];  Service: Orthopedics;  Laterality: Left;  medial condyle  . Chondroplasty Right 12/23/2012    Procedure: KNEE ARTHROSCOPY WITH CHONDROPLASTY WITH LIMITED DEBRIDMENT;  Surgeon: Vickki Hearing, MD;  Location: [facility];  Service: Orthopedics;  Laterality: Right;  . Cholecystectomy N/A 05/15/2013  Procedure: LAPAROSCOPIC CHOLECYSTECTOMY WITH INTRAOPERATIVE CHOLANGIOGRAM;  Surgeon: Dalia HeadingMark A Jenkins, MD;  Location: [facility];  Service: General;  Laterality: N/A;   Social History:  reports that she has never smoked. She does not have any smokeless tobacco history on file. She reports that she does not drink alcohol or use illicit drugs.   Family History  Problem Relation Age of Onset  . Hypertension Mother     borderline     Allergies    Allergen Reactions  . Codeine Nausea Only      Prior to Admission medications   Medication Sig Start Date End Date Taking? Authorizing Provider  Biotin 5000 MCG TABS Take 1 tablet by mouth daily.   Yes Historical Provider, MD  Cholecalciferol (VITAMIN D) 2000 UNITS CAPS Take 1 capsule by mouth daily.   Yes Historical Provider, MD  estrogens, conjugated, (PREMARIN) 0.3 MG tablet Take 1 tablet (0.3 mg total) by mouth daily. 10/05/12  Yes Merlyn AlbertWilliam S Luking, MD  gabapentin (NEURONTIN) 100 MG capsule Take 1 capsule (100 mg total) by mouth 3 (three) times daily. 07/04/13  Yes Vickki HearingStanley E Harrison, MD  ibuprofen (ADVIL,MOTRIN) 200 MG tablet Take 400 mg by mouth every 6 (six) hours as needed. pain   Yes Historical Provider, MD  ciprofloxacin (CIPRO) 500 MG tablet Take 1 tablet (500 mg total) by mouth 2 (two) times daily. 05/08/13   Flint MelterElliott L Wentz, MD  HYDROcodone-acetaminophen (NORCO/VICODIN) 5-325 MG per tablet Take 1 tablet by mouth every 6 (six) hours as needed for moderate pain. 05/15/13 05/15/14  Dalia HeadingMark A Jenkins, MD  ketoconazole (NIZORAL) 2 % cream APPLY TO AFFECTED AREAS TWICE DAILY. 05/12/13   Babs SciaraScott A Luking, MD  Magnesium 250 MG TABS Take 250 mg by mouth 2 (two) times daily.    Historical Provider, MD    Review of Systems:  Constitutional:  No weight loss, night sweats,  fatigue.  Head&Eyes: No headache.  No vision loss.  No eye pain or scotoma ENT:  No Difficulty swallowing,Tooth/dental problems,Sore throat,  No ear ache, post nasal drip,  Cardio-vascular:  No chest pain, Orthopnea, PND, swelling in lower extremities,  dizziness, palpitations  GI:  No   vomiting, diarrhea, loss of appetite, hematochezia, melena, heartburn, indigestion, Resp:   No cough. No coughing up of blood .No wheezing.No chest wall deformity  Skin:  no rash or lesions.  GU:  no dysuria, change in color of urine, no urgency or frequency. No flank pain.  Musculoskeletal:  No joint pain or swelling. No decreased  range of motion. No back pain.  Psych:  No change in mood or affect. No depression or anxiety. Neurologic: No headache, no dysesthesia, no focal weakness, no vision loss. No syncope  Physical Exam: Filed Vitals:   07/09/13 0527 07/09/13 0720 07/09/13 0911  BP: 95/66 147/67 152/66  Pulse: 68 75 78  Temp: 97.7 F (36.5 C)  98.2 F (36.8 C)  TempSrc: Oral  Oral  Resp: 24 26 24   Height: 5\' 1"  (1.549 m)    Weight: 83.915 kg (185 lb)    SpO2: 100% 99% 96%   General:  A&O x 3, NAD, nontoxic, pleasant/cooperative Head/Eye: No conjunctival hemorrhage, no icterus, [place]/AT, No nystagmus ENT:  No icterus,  No thrush, good dentition, no pharyngeal exudate Neck:  No masses, no lymphadenpathy, no bruits CV:  RRR, no rub, no gallop, no S3 Lung:  Bibasilar crackles, left greater than right. No wheezing. Good air movement. Abdomen: soft/epigastric pain without peritoneal signs, +BS, nondistended, no  peritoneal signs Ext: No cyanosis, No rashes, No petechiae, No lymphangitis, trace LE edema   Labs on Admission:  Basic Metabolic Panel:  Recent Labs Lab 07/09/13 0557  NA 140  K 3.6*  CL 99  CO2 26  GLUCOSE 157*  BUN 16  CREATININE 0.69  CALCIUM 9.6   Liver Function Tests:  Recent Labs Lab 07/09/13 0557  AST 15  ALT 13  ALKPHOS 87  BILITOT 0.2*  PROT 8.0  ALBUMIN 3.7    Recent Labs Lab 07/09/13 0557  LIPASE >3000*   No results found for this basename: AMMONIA,  in the last 168 hours CBC:  Recent Labs Lab 07/09/13 0557  WBC 12.8*  NEUTROABS 9.6*  HGB 14.0  HCT 43.5  MCV 89.3  PLT 342   Cardiac Enzymes:  Recent Labs Lab 07/09/13 0557  TROPONINI <0.30   BNP: No components found with this basename: POCBNP,  CBG: No results found for this basename: GLUCAP,  in the last 168 hours  Radiological Exams on Admission: Ct Abdomen Pelvis W Contrast  07/09/2013   CLINICAL DATA:  Upper abdominal pain. Recent cholecystectomy 05/15/2013.  EXAM: CT ABDOMEN AND PELVIS  WITH CONTRAST  TECHNIQUE: Multidetector CT imaging of the abdomen and pelvis was performed using the standard protocol following bolus administration of intravenous contrast.  CONTRAST:  50mL OMNIPAQUE IOHEXOL 300 MG/ML SOLN, 100mL OMNIPAQUE IOHEXOL 300 MG/ML SOLN  COMPARISON:  Ultrasound abdomen 05/08/2013. Intraoperative cholangiogram 05/15/2013.  FINDINGS: Lung bases are clear except for minimal scarring/subsegmental atelectatic change. Trace fluid over the dome of the liver without focal intrahepatic defects. Normal spleen. No renal obstruction. Simple 4 cm left renal cyst projects laterally from midpole.  In the gallbladder fossa there is considerable low attenuation fluid, abnormal given the length of time since surgery. Surgical clips are seen in the region of the cystic duct. There is no biliary ductal dilatation within limits of detection. No visible CBD stone. No pancreatic duct dilatation.  There is mild edema surrounding the head of the pancreas and fluid tracking posterior to the body and tail of the pancreas along the anterior margin of Gerota's fascia to the left. Considerations include a small bile leak, pancreatitis, or less likely abscess. ERCP could be helpful in further evaluation to evaluate for a small retained stone or possibly a bile leak. Alternately a hepatobiliary scan could assess for abnormal accumulation of radiotracer outside the biliary tree.  No appendiceal inflammation. Mild stool burden. No pelvic masses. Lumbar degenerative disc disease. No worrisome osseous lesions. Tiny fat containing umbilical hernia. No adenopathy.  IMPRESSION: Abnormal fluid accumulation in the gallbladder fossa, around the head of the pancreas, and tracking leftward along the body and tail of the pancreas. Pancreatitis versus bile leak are the more likely considerations. Postoperative abscess is less favored. Correlate clinically.   Electronically Signed   By: Davonna BellingJohn  Curnes M.D.   On: 07/09/2013 08:20     EKG: Independently reviewed. pending    Time spent:60 minutes Code Status:   FULL Family Communication:   husband at bedside   Catarina HartshornDavid Verlyn Lambert, DO  [facility] Pager [phone]  If 7PM-7AM, please contact night-coverage [email or web address] Password [email or web address] 07/09/2013, 10:00 AM

## 2013-07-10 ENCOUNTER — Telehealth: Payer: Self-pay | Admitting: Family Medicine

## 2013-07-10 DIAGNOSIS — E785 Hyperlipidemia, unspecified: Secondary | ICD-10-CM

## 2013-07-10 LAB — COMPREHENSIVE METABOLIC PANEL
ALT: 9 U/L (ref 0–35)
AST: 15 U/L (ref 0–37)
Albumin: 3.1 g/dL — ABNORMAL LOW (ref 3.5–5.2)
Alkaline Phosphatase: 74 U/L (ref 39–117)
BILIRUBIN TOTAL: 0.3 mg/dL (ref 0.3–1.2)
BUN: 10 mg/dL (ref 6–23)
CHLORIDE: 103 meq/L (ref 96–112)
CO2: 26 meq/L (ref 19–32)
CREATININE: 0.64 mg/dL (ref 0.50–1.10)
Calcium: 8.2 mg/dL — ABNORMAL LOW (ref 8.4–10.5)
GFR calc Af Amer: >90 mL/min (ref >90)
GFR, EST NON AFRICAN AMERICAN: 86 mL/min — AB (ref >90)
GLUCOSE: 109 mg/dL — AB (ref 70–99)
POTASSIUM: 4.4 meq/L (ref 3.7–5.3)
Sodium: 140 mEq/L (ref 137–147)
Total Protein: 7 g/dL (ref 6.0–8.3)

## 2013-07-10 LAB — URINE CULTURE: Colony Count: 15000

## 2013-07-10 LAB — CBC
HCT: 38.1 % (ref 36.0–46.0)
Hemoglobin: 12 g/dL (ref 12.0–15.0)
MCH: 28.5 pg (ref 26.0–34.0)
MCHC: 31.5 g/dL (ref 30.0–36.0)
MCV: 90.5 fL (ref 78.0–100.0)
PLATELETS: 300 10*3/uL (ref 150–400)
RBC: 4.21 MIL/uL (ref 3.87–5.11)
RDW: 15.7 % — AB (ref 11.5–15.5)
WBC: 20.8 10*3/uL — AB (ref 4.0–10.5)

## 2013-07-10 MED ORDER — IMIPENEM-CILASTATIN 500 MG IV SOLR
500.0000 mg | Freq: Three times a day (TID) | INTRAVENOUS | Status: DC
  Administered 2013-07-10 – 2013-07-15 (×15): 500 mg via INTRAVENOUS
  Filled 2013-07-10 (×22): qty 500

## 2013-07-10 NOTE — Progress Notes (Signed)
PROGRESS NOTE  Vertell LimberRuth S Westley ZOX:[id number] DOB: [birth year] DOA: 07/09/2013 PCP: Harlow AsaLUKING,W S, MD  Assessment/Plan: Acute pancreatitis  -Likely related to medications, particularly Premarin although microlithiasis cannot be ruled out  -However, patient has no biliary ductal dilatation on CT scan  -Check triglycerides--150 -IV fluids--decreased redness the patient has bibasilar crackles  -Bowel rest  -Pain control/antiemetics  -Discontinue Premarin  Dyslipidemia -TChol 277, LDL 160 Gallbladder fossa fluid  -Suspect that this inflammatory fluid likely related to her pancreatitis  -Appreciate Dr. Gwyndolyn SaxonJenkins--did not feel pt had bile leak Dyspnea  -Likely related to the patient's epigastric pain  -EKG--NSR, no ST-T changes -Chest x-ray--no infiltrates, no edema Leukocytosis  -Likely due to the patient's acute medical condition  -Urinalysis--no pyuria -blood cultures x 2 sets -empiric imipenem pending culture data  -Continue to monitor    Family Communication:   son at beside Disposition Plan:   Home when medically stable   Antibiotics:  Imipenem 07/10/13>>>    Procedures/Studies: Dg Chest 1 View  07/09/2013   CLINICAL DATA:  Dyspnea and nausea.  EXAM: CHEST - 1 VIEW  COMPARISON:  Chest x-ray 05/08/2013.  FINDINGS: Lung volumes are low. No consolidative airspace disease. No pleural effusions. No pneumothorax. No pulmonary nodule or mass noted. Pulmonary vasculature and the cardiomediastinal silhouette are within normal limits. Orthopedic fixation hardware in the cervical spine incidentally noted.  IMPRESSION: Low lung volumes without radiographic evidence of acute cardiopulmonary disease.   Electronically Signed   By: Trudie Reedaniel  Entrikin M.D.   On: 07/09/2013 18:27   Ct Abdomen Pelvis W Contrast  07/09/2013   CLINICAL DATA:  Upper abdominal pain. Recent cholecystectomy 05/15/2013.  EXAM: CT ABDOMEN AND PELVIS WITH CONTRAST  TECHNIQUE: Multidetector CT imaging of the abdomen  and pelvis was performed using the standard protocol following bolus administration of intravenous contrast.  CONTRAST:  50mL OMNIPAQUE IOHEXOL 300 MG/ML SOLN, 100mL OMNIPAQUE IOHEXOL 300 MG/ML SOLN  COMPARISON:  Ultrasound abdomen 05/08/2013. Intraoperative cholangiogram 05/15/2013.  FINDINGS: Lung bases are clear except for minimal scarring/subsegmental atelectatic change. Trace fluid over the dome of the liver without focal intrahepatic defects. Normal spleen. No renal obstruction. Simple 4 cm left renal cyst projects laterally from midpole.  In the gallbladder fossa there is considerable low attenuation fluid, abnormal given the length of time since surgery. Surgical clips are seen in the region of the cystic duct. There is no biliary ductal dilatation within limits of detection. No visible CBD stone. No pancreatic duct dilatation.  There is mild edema surrounding the head of the pancreas and fluid tracking posterior to the body and tail of the pancreas along the anterior margin of Gerota's fascia to the left. Considerations include a small bile leak, pancreatitis, or less likely abscess. ERCP could be helpful in further evaluation to evaluate for a small retained stone or possibly a bile leak. Alternately a hepatobiliary scan could assess for abnormal accumulation of radiotracer outside the biliary tree.  No appendiceal inflammation. Mild stool burden. No pelvic masses. Lumbar degenerative disc disease. No worrisome osseous lesions. Tiny fat containing umbilical hernia. No adenopathy.  IMPRESSION: Abnormal fluid accumulation in the gallbladder fossa, around the head of the pancreas, and tracking leftward along the body and tail of the pancreas. Pancreatitis versus bile leak are the more likely considerations. Postoperative abscess is less favored. Correlate clinically.   Electronically Signed   By: Davonna BellingJohn  Curnes M.D.   On: 07/09/2013 08:20  Subjective: Patient is feeling a little bit better.  Still complains of abdominal pain. Denies any fevers, chills, chest pain,  nausea, vomiting, diarrhea, dysuria, hematuria. Has not had a bowel movement. Has some shortness of breath when her pain worsens  Objective: Filed Vitals:   07/09/13 1115 07/09/13 1439 07/09/13 2121 07/10/13 0452  BP: 143/83 164/83 164/65 144/66  Pulse: 71 76 96 89  Temp: 97.7 F (36.5 C) 97.7 F (36.5 C) 98 F (36.7 C) 98 F (36.7 C)  TempSrc: Oral Oral Oral Oral  Resp: 18 18 18 18   Height:      Weight: 90.266 kg (199 lb)   91.853 kg (202 lb 8 oz)  SpO2: 93% 91% 92% 92%    Intake/Output Summary (Last 24 hours) at 07/10/13 0939 Last data filed at 07/10/13 0651  Gross per 24 hour  Intake 2735.42 ml  Output    200 ml  Net 2535.42 ml   Weight change: 6.35 kg (14 lb) Exam:   General:  Pt is alert, follows commands appropriately, not in acute distress  HEENT: No icterus, No thrush,[place]/AT  Cardiovascular: RRR, S1/S2, no rubs, no gallops  Respiratory: Bibasilar crackles. No wheezing good air movement.  Abdomen: Soft/+BS, non tender, non distended, no guarding  Extremities: No edema, No lymphangitis, No petechiae, No rashes, no synovitis  Data Reviewed: Basic Metabolic Panel:  Recent Labs Lab 07/09/13 0557 07/10/13 0535  NA 140 140  K 3.6* 4.4  CL 99 103  CO2 26 26  GLUCOSE 157* 109*  BUN 16 10  CREATININE 0.69 0.64  CALCIUM 9.6 8.2*   Liver Function Tests:  Recent Labs Lab 07/09/13 0557 07/10/13 0535  AST 15 15  ALT 13 9  ALKPHOS 87 74  BILITOT 0.2* 0.3  PROT 8.0 7.0  ALBUMIN 3.7 3.1*    Recent Labs Lab 07/09/13 0557  LIPASE >3000*   No results found for this basename: AMMONIA,  in the last 168 hours CBC:  Recent Labs Lab 07/09/13 0557 07/10/13 0535  WBC 12.8* 20.8*  NEUTROABS 9.6*  --   HGB 14.0 12.0  HCT 43.5 38.1  MCV 89.3 90.5  PLT 342 300   Cardiac Enzymes:  Recent Labs Lab 07/09/13 0557  TROPONINI <0.30   BNP: No components found with this  basename: POCBNP,  CBG: No results found for this basename: GLUCAP,  in the last 168 hours  Recent Results (from the past 240 hour(s))  CULTURE, BLOOD (ROUTINE X 2)     Status: None   Collection Time    07/10/13  7:25 AM      Result Value Ref Range Status   Specimen Description BLOOD LEFT ARM   Final   Special Requests     Final   Value: BOTTLES DRAWN AEROBIC AND ANAEROBIC 8CC EACH BOTTLE   Culture PENDING   Incomplete   Report Status PENDING   Incomplete  CULTURE, BLOOD (ROUTINE X 2)     Status: None   Collection Time    07/10/13  7:25 AM      Result Value Ref Range Status   Specimen Description BLOOD LEFT ARM   Final   Special Requests     Final   Value: BOTTLES DRAWN AEROBIC AND ANAEROBIC 8CC EACH BOTTLE   Culture PENDING   Incomplete   Report Status PENDING   Incomplete     Scheduled Meds: . docusate sodium  100 mg Oral BID  . enoxaparin (LOVENOX) injection  40 mg Subcutaneous Daily  .  senna  1 tablet Oral BID   Continuous Infusions: . 0.9 % NaCl with KCl 20 mEq / L 125 mL/hr at 07/09/13 2037     Catarina Hartshorn, DO  [facility] Pager [phone]  If 7PM-7AM, please contact night-coverage [email or web address] Password [facility] 07/10/2013, 9:39 AM   LOS: 1 day

## 2013-07-10 NOTE — Telephone Encounter (Signed)
Notified Fulton Molelice (sister) the doctors no longer have hospital privileges and we do not round on patients at the hospital. Fulton MoleAlice verbalized understanding.

## 2013-07-10 NOTE — Telephone Encounter (Signed)
Ntsw, plz call this sister, let her know but we no longer have hospital privileges and we do not round on patients at the hospital. I understand pancreatitis can be very severe and that is one reason why we want to have specialist in gastroenterology and hospital medicine caring for her--and not family doctors who do not do hospital care

## 2013-07-10 NOTE — Care Management Note (Addendum)
    Page 1 of 1   07/13/2013     1:29:37 PM CARE MANAGEMENT NOTE 07/13/2013  Patient:  Vertell LimberSCOTT,Shakendra S   Account Number:  [id number]  Date Initiated:  07/10/2013  Documentation initiated by:  Rosemary HolmsOBSON,Josephyne Tarter  Subjective/Objective Assessment:   Pt lives at home with her spouse. Spouse let slip that he could use some assistance and "get a break" but then declined Private Duty List.     Action/Plan:   CM to follow, possible PT consult.   Anticipated DC Date:  07/14/2013   Anticipated DC Plan:  HOME/SELF CARE      DC Planning Services  CM consult      Choice offered to / List presented to:             Status of service:  In process, will continue to follow Medicare Important Message given?   (If response is "NO", the following Medicare IM given date fields will be blank) Date Medicare IM given:   Date Additional Medicare IM given:    Discharge Disposition:    Per UR Regulation:    If discussed at Long Length of Stay Meetings, dates discussed:    Comments:  07/13/13 Rosemary HolmsAmy Yaelis Scharfenberg RN BSN CM Advancing diet  07/10/13 Rosemary HolmsAmy Presten Joost RN BSN CM

## 2013-07-10 NOTE — Care Management Utilization Note (Signed)
UR completed 

## 2013-07-10 NOTE — Telephone Encounter (Signed)
Patients sister called to let Dr Brett CanalesSteve know that patient is in the hospital for pancreatitis and she is hoping she can go over there and check on her at some point because she is very sick.

## 2013-07-11 ENCOUNTER — Inpatient Hospital Stay (HOSPITAL_COMMUNITY): Payer: Medicare Other

## 2013-07-11 DIAGNOSIS — I519 Heart disease, unspecified: Secondary | ICD-10-CM

## 2013-07-11 DIAGNOSIS — R0902 Hypoxemia: Secondary | ICD-10-CM | POA: Diagnosis present

## 2013-07-11 DIAGNOSIS — I509 Heart failure, unspecified: Secondary | ICD-10-CM

## 2013-07-11 DIAGNOSIS — J9600 Acute respiratory failure, unspecified whether with hypoxia or hypercapnia: Secondary | ICD-10-CM

## 2013-07-11 LAB — HEPATIC FUNCTION PANEL
ALK PHOS: 94 U/L (ref 39–117)
ALT: 12 U/L (ref 0–35)
AST: 20 U/L (ref 0–37)
Albumin: 3 g/dL — ABNORMAL LOW (ref 3.5–5.2)
Bilirubin, Direct: <0.2 mg/dL (ref 0.0–0.3)
TOTAL PROTEIN: 7 g/dL (ref 6.0–8.3)
Total Bilirubin: 0.5 mg/dL (ref 0.3–1.2)

## 2013-07-11 LAB — COMPREHENSIVE METABOLIC PANEL
ALBUMIN: 2.9 g/dL — AB (ref 3.5–5.2)
ALK PHOS: 92 U/L (ref 39–117)
ALT: 12 U/L (ref 0–35)
AST: 19 U/L (ref 0–37)
BILIRUBIN TOTAL: 0.5 mg/dL (ref 0.3–1.2)
BUN: 6 mg/dL (ref 6–23)
CO2: 24 mEq/L (ref 19–32)
Calcium: 8.3 mg/dL — ABNORMAL LOW (ref 8.4–10.5)
Chloride: 101 mEq/L (ref 96–112)
Creatinine, Ser: 0.56 mg/dL (ref 0.50–1.10)
GFR calc Af Amer: >90 mL/min (ref >90)
GFR calc non Af Amer: >90 mL/min (ref >90)
Glucose, Bld: 112 mg/dL — ABNORMAL HIGH (ref 70–99)
POTASSIUM: 4 meq/L (ref 3.7–5.3)
Sodium: 138 mEq/L (ref 137–147)
Total Protein: 7 g/dL (ref 6.0–8.3)

## 2013-07-11 LAB — CBC
HCT: 36.8 % (ref 36.0–46.0)
Hemoglobin: 11.8 g/dL — ABNORMAL LOW (ref 12.0–15.0)
MCH: 28.9 pg (ref 26.0–34.0)
MCHC: 32.1 g/dL (ref 30.0–36.0)
MCV: 90.2 fL (ref 78.0–100.0)
PLATELETS: 287 10*3/uL (ref 150–400)
RBC: 4.08 MIL/uL (ref 3.87–5.11)
RDW: 15.9 % — AB (ref 11.5–15.5)
WBC: 20.7 10*3/uL — ABNORMAL HIGH (ref 4.0–10.5)

## 2013-07-11 LAB — CLOSTRIDIUM DIFFICILE BY PCR: Toxigenic C. Difficile by PCR: NEGATIVE

## 2013-07-11 LAB — PRO B NATRIURETIC PEPTIDE: Pro B Natriuretic peptide (BNP): 709.3 pg/mL — ABNORMAL HIGH (ref 0–125)

## 2013-07-11 MED ORDER — ALBUTEROL SULFATE (2.5 MG/3ML) 0.083% IN NEBU
INHALATION_SOLUTION | RESPIRATORY_TRACT | Status: AC
  Administered 2013-07-11: 2.5 mg
  Filled 2013-07-11: qty 3

## 2013-07-11 MED ORDER — ALBUTEROL SULFATE (2.5 MG/3ML) 0.083% IN NEBU
2.5000 mg | INHALATION_SOLUTION | RESPIRATORY_TRACT | Status: DC
  Administered 2013-07-11 – 2013-07-12 (×4): 2.5 mg via RESPIRATORY_TRACT
  Filled 2013-07-11 (×4): qty 3

## 2013-07-11 MED ORDER — ALBUTEROL SULFATE (2.5 MG/3ML) 0.083% IN NEBU: 2.5000 mg | INHALATION_SOLUTION | RESPIRATORY_TRACT | Status: DC | PRN

## 2013-07-11 MED ORDER — FUROSEMIDE 10 MG/ML IJ SOLN
40.0000 mg | Freq: Once | INTRAMUSCULAR | Status: AC
  Filled 2013-07-11: qty 4

## 2013-07-11 NOTE — Progress Notes (Signed)
PROGRESS NOTE  Kerry Richardson RUE:[id number] DOB: 11-24-1940 DOA: 07/09/2013 PCP: Harlow Asa, MD  Interim summary 73 year old female with a history of cholecystitis status post laparoscopic cholecystectomy on 05/15/2013 (Dr. Lovell Sheehan) presents with acute onset of abdominal pain that woke her up from sleep around 3:30 in the morning on 07/09/2013. Labs revealed unremarkable BMP. LFTs were normal. Lipase was greater than 3000. WBC was 12.8. CT of the abdomen and pelvis showed trace amount of fluid on the dome of the liver with low attenuation fluid in the gallbladder fossa. The patient was started on intravenous fluids with bowel rest. General surgery was consulted. They did not feel the patient had a bile leak. Unfortunately, the patient developed fluid overload with fluid resuscitation. The patient was started on intravenous furosemide.   Assessment/Plan: Acute CHF -developed due to fluid resuscitation for acute pancreatitis -+3.6L -Saline lock IV fluids  -Start intravenous furosemide -Daily weights -Echocardiogram  -Supplemental oxygen -Chest x-ray--poor inspiration; vascular congestion --EKG--NSR, no ST-T changes Acute respiratory Failure  -secondary to fluid overload/CHF Acute pancreatitis  -Dry heaving and abdominal pain are improving -Continue clinical diet -Likely related to medications, particularly Premarin although microlithiasis cannot be ruled out  -However, patient has no biliary ductal dilatation on CT scan  -Check triglycerides--150  -IV fluids--decreased redness the patient has bibasilar crackles  -Bowel rest  -Pain control/antiemetics  -Discontinue Premarin  Diarrhea -C. difficile PCR Dyslipidemia  -TChol 277, LDL 160  Gallbladder fossa fluid  -Suspect that this inflammatory fluid likely related to her pancreatitis  -Appreciate Dr. Gwyndolyn Saxon not feel pt had bile leak    Leukocytosis  -Likely due to the patient's acute medical condition    -Urinalysis--no pyuria  -blood cultures x 2 sets  -empiric imipenem pending culture data  -Continue to monitor  Family Communication: son at beside  Disposition Plan: Home when medically stable  Antibiotics:  Imipenem 07/10/13>>>          Procedures/Studies: Dg Chest 1 View  07/09/2013   CLINICAL DATA:  Dyspnea and nausea.  EXAM: CHEST - 1 VIEW  COMPARISON:  Chest x-ray 05/08/2013.  FINDINGS: Lung volumes are low. No consolidative airspace disease. No pleural effusions. No pneumothorax. No pulmonary nodule or mass noted. Pulmonary vasculature and the cardiomediastinal silhouette are within normal limits. Orthopedic fixation hardware in the cervical spine incidentally noted.  IMPRESSION: Low lung volumes without radiographic evidence of acute cardiopulmonary disease.   Electronically Signed   By: Trudie Reed M.D.   On: 07/09/2013 18:27   Ct Abdomen Pelvis W Contrast  07/09/2013   CLINICAL DATA:  Upper abdominal pain. Recent cholecystectomy 05/15/2013.  EXAM: CT ABDOMEN AND PELVIS WITH CONTRAST  TECHNIQUE: Multidetector CT imaging of the abdomen and pelvis was performed using the standard protocol following bolus administration of intravenous contrast.  CONTRAST:  50mL OMNIPAQUE IOHEXOL 300 MG/ML SOLN, OMNIPAQUE IOHEXOL 300 MG/ML SOLN  COMPARISON:  Ultrasound abdomen 05/08/2013. Intraoperative cholangiogram 05/15/2013.  FINDINGS: Lung bases are clear except for minimal scarring/subsegmental atelectatic change. Trace fluid over the dome of the liver without focal intrahepatic defects. Normal spleen. No renal obstruction. Simple 4 cm left renal cyst projects laterally from midpole.  In the gallbladder fossa there is considerable low attenuation fluid, abnormal given the length of time since surgery. Surgical clips are seen in the region of the cystic duct. There is no biliary ductal dilatation within limits of detection. No visible CBD stone. No pancreatic duct dilatation.  There is  mild edema surrounding the head of the pancreas and fluid tracking posterior to the body and tail of the pancreas along the anterior margin of Gerota's fascia to the left. Considerations include a small bile leak, pancreatitis, or less likely abscess. ERCP could be helpful in further evaluation to evaluate for a small retained stone or possibly a bile leak. Alternately a hepatobiliary scan could assess for abnormal accumulation of radiotracer outside the biliary tree.  No appendiceal inflammation. Mild stool burden. No pelvic masses. Lumbar degenerative disc disease. No worrisome osseous lesions. Tiny fat containing umbilical hernia. No adenopathy.  IMPRESSION: Abnormal fluid accumulation in the gallbladder fossa, around the head of the pancreas, and tracking leftward along the body and tail of the pancreas. Pancreatitis versus bile leak are the more likely considerations. Postoperative abscess is less favored. Correlate clinically.   Electronically Signed   By: Davonna BellingJohn  Curnes M.D.   On: 07/09/2013 08:20   Dg Chest Port 1 View  07/11/2013   CLINICAL DATA:  Shortness of breath  EXAM: PORTABLE CHEST - 1 VIEW  COMPARISON:  DG CHEST 1 VIEW dated 07/09/2013  FINDINGS: Shallow inspiration. Heart size and pulmonary vascularity are normal for inspiratory effort. Linear atelectasis in the lung bases. No focal consolidation. No blunting of costophrenic angles. No pneumothorax. Postoperative changes in the cervical spine.  IMPRESSION: Shallow inspiration.  No evidence of active pulmonary disease.   Electronically Signed   By: Burman NievesWilliam  Stevens M.D.   On: 07/11/2013 05:43         Subjective: Patient developed shortness of breath. She denies any chest discomfort, headache, fevers, chills, dizziness. She still has some nausea. Vomiting has improved. Abdominal pain has improved. No dysuria or hematuria. Complains of some loose stools x3. No hematochezia or melena.  Objective: Filed Vitals:   07/11/13 0434 07/11/13 0437  07/11/13 0442 07/11/13 0552  BP: 181/103 178/96  164/69  Pulse: 99   101  Temp: 98.5 F (36.9 C)   98.5 F (36.9 C)  TempSrc: Oral   Oral  Resp: 24   22  Height:      Weight:      SpO2: 95%  95% 97%    Intake/Output Summary (Last 24 hours) at 07/11/13 [id number] Last data filed at 07/11/13 0800  Gross per 24 hour  Intake   2280 ml  Output   2200 ml  Net     80 ml   Weight change:  Exam:   General:  Pt is alert, follows commands appropriately, not in acute distress  HEENT: No icterus, No thrush,  /AT  Cardiovascular: RRR, S1/S2, no rubs, no gallops  Respiratory: Bilateral crackles. No wheezing. Good air movement.  Abdomen: Soft/+BS, mild epigastric tenderness without any peritoneal signs, non distended, no guarding  Extremities: 1+LE edema, No lymphangitis, No petechiae, No rashes, no synovitis  Data Reviewed: Basic Metabolic Panel:  Recent Labs Lab 07/09/13 0557 07/10/13 0535 07/11/13 0556  NA 140 140 138  K 3.6* 4.4 4.0  CL 99 103 101  CO2 26 26 24   GLUCOSE 157* 109* 112*  BUN 16 10 6   CREATININE 0.69 0.64 0.56  CALCIUM 9.6 8.2* 8.3*   Liver Function Tests:  Recent Labs Lab 07/09/13 0557 07/10/13 0535 07/11/13 0556  AST 15 15 19   ALT 13 9 12   ALKPHOS 87 74 92  BILITOT 0.2* 0.3 0.5  PROT 8.0 7.0 7.0  ALBUMIN 3.7 3.1* 2.9*    Recent Labs Lab 07/09/13 0557  LIPASE >3000*   No  results found for this basename: AMMONIA,  in the last 168 hours CBC:  Recent Labs Lab 07/09/13 0557 07/10/13 0535 07/11/13 0556  WBC 12.8* 20.8* 20.7*  NEUTROABS 9.6*  --   --   HGB 14.0 12.0 11.8*  HCT 43.5 38.1 36.8  MCV 89.3 90.5 90.2  PLT 342 300 287   Cardiac Enzymes:  Recent Labs Lab 07/09/13 0557  TROPONINI <0.30   BNP: No components found with this basename: POCBNP,  CBG: No results found for this basename: GLUCAP,  in the last 168 hours  Recent Results (from the past 240 hour(s))  URINE CULTURE     Status: None   Collection Time     07/09/13  3:14 PM      Result Value Ref Range Status   Specimen Description URINE, CLEAN CATCH   Final   Special Requests NONE   Final   Culture  Setup Time     Final   Value: 07/09/2013 20:14     [facility]formed at [facility]   Colony Count     Final   Value: 15,000 COLONIES/ML     [facility]formed at [facility]   Culture     Final   Value: Multiple bacterial morphotypes present, none predominant. Suggest appropriate recollection if clinically indicated.     Performed at [facility]   Report Status 07/10/2013 FINAL   Final  CULTURE, BLOOD (ROUTINE X 2)     Status: None   Collection Time    07/10/13  7:25 AM      Result Value Ref Range Status   Specimen Description BLOOD LEFT ARM   Final   Special Requests     Final   Value: BOTTLES DRAWN AEROBIC AND ANAEROBIC 8CC EACH BOTTLE   Culture NO GROWTH <24 HRS   Final   Report Status PENDING   Incomplete  CULTURE, BLOOD (ROUTINE X 2)     Status: None   Collection Time    07/10/13  7:25 AM      Result Value Ref Range Status   Specimen Description BLOOD LEFT ARM   Final   Special Requests     Final   Value: BOTTLES DRAWN AEROBIC AND ANAEROBIC 8CC EACH BOTTLE   Culture NO GROWTH <24 HRS   Final   Report Status PENDING   Incomplete     Scheduled Meds: . docusate sodium  100 mg Oral BID  . enoxaparin (LOVENOX) injection  40 mg Subcutaneous Daily  . furosemide  40 mg Intravenous Once  . imipenem-cilastatin  500 mg Intravenous 3 times per day  . senna  1 tablet Oral BID   Continuous Infusions:    Catarina HartshornDavid Danese Dorsainvil, DO  [facility] Pager [phone]  If 7PM-7AM, please contact night-coverage [email or web address] Password [email or web address] 07/11/2013, 8:32 AM   LOS: 2 days

## 2013-07-11 NOTE — Progress Notes (Signed)
Late entry for 0449, pt was experiencing dyspnea at rest. Pt is also wheezing. VS showed O2 sat was 95% but patients BP was elevated. Paged the on-call physician who ordered chest x-ray and breathing treatment. Continued to monitor the patient and follow any orders given.

## 2013-07-11 NOTE — Progress Notes (Signed)
Patient O2 sats 84% Room air, asymptomatic at 1411 07/10/13. O2 2l nasal cannula applied, O2 sats increased to 94%. Reported to oncoming nurse, to monitor and wean off. Dr. Arbutus Leasat notified this AM.

## 2013-07-11 NOTE — Progress Notes (Signed)
Echocardiogram 2D Echocardiogram has been performed.  Liam Grahamaleba A Yichen Gilardi 07/11/2013, 11:11 AM

## 2013-07-11 NOTE — Progress Notes (Signed)
Patient states that she feels breathing has worsened again - bilateral upper lobe wheezing.  Will notify MD

## 2013-07-12 ENCOUNTER — Encounter (HOSPITAL_COMMUNITY): Payer: Self-pay | Admitting: Radiology

## 2013-07-12 ENCOUNTER — Inpatient Hospital Stay (HOSPITAL_COMMUNITY): Payer: Medicare Other

## 2013-07-12 DIAGNOSIS — I10 Essential (primary) hypertension: Secondary | ICD-10-CM | POA: Diagnosis present

## 2013-07-12 DIAGNOSIS — E876 Hypokalemia: Secondary | ICD-10-CM | POA: Diagnosis not present

## 2013-07-12 LAB — BASIC METABOLIC PANEL
BUN: 5 mg/dL — ABNORMAL LOW (ref 6–23)
CO2: 27 meq/L (ref 19–32)
CREATININE: 0.51 mg/dL (ref 0.50–1.10)
Calcium: 8.3 mg/dL — ABNORMAL LOW (ref 8.4–10.5)
Chloride: 96 mEq/L (ref 96–112)
GFR calc non Af Amer: >90 mL/min (ref >90)
Glucose, Bld: 116 mg/dL — ABNORMAL HIGH (ref 70–99)
Potassium: 3 mEq/L — ABNORMAL LOW (ref 3.7–5.3)
Sodium: 137 mEq/L (ref 137–147)

## 2013-07-12 LAB — CBC
HCT: 34.4 % — ABNORMAL LOW (ref 36.0–46.0)
Hemoglobin: 11.3 g/dL — ABNORMAL LOW (ref 12.0–15.0)
MCH: 29 pg (ref 26.0–34.0)
MCHC: 32.8 g/dL (ref 30.0–36.0)
MCV: 88.4 fL (ref 78.0–100.0)
PLATELETS: 258 10*3/uL (ref 150–400)
RBC: 3.89 MIL/uL (ref 3.87–5.11)
RDW: 15.4 % (ref 11.5–15.5)
WBC: 18.9 10*3/uL — ABNORMAL HIGH (ref 4.0–10.5)

## 2013-07-12 LAB — LIPASE, BLOOD: LIPASE: 19 U/L (ref 11–59)

## 2013-07-12 LAB — MAGNESIUM: Magnesium: 2 mg/dL (ref 1.5–2.5)

## 2013-07-12 MED ORDER — IOHEXOL 350 MG/ML SOLN
100.0000 mL | Freq: Once | INTRAVENOUS | Status: AC | PRN
  Administered 2013-07-12: 100 mL via INTRAVENOUS

## 2013-07-12 MED ORDER — HYDROMORPHONE HCL PF 1 MG/ML IJ SOLN
1.0000 mg | INTRAMUSCULAR | Status: DC | PRN
  Administered 2013-07-12 (×2): 1 mg via INTRAVENOUS
  Filled 2013-07-12 (×2): qty 1

## 2013-07-12 MED ORDER — SODIUM CHLORIDE 0.9 % IJ SOLN: 3.0000 mL | INTRAMUSCULAR | Status: DC | PRN

## 2013-07-12 MED ORDER — FUROSEMIDE 10 MG/ML IJ SOLN
40.0000 mg | Freq: Once | INTRAMUSCULAR | Status: AC
  Administered 2013-07-12: 40 mg via INTRAVENOUS
  Filled 2013-07-12: qty 4

## 2013-07-12 MED ORDER — POTASSIUM CHLORIDE CRYS ER 20 MEQ PO TBCR
40.0000 meq | EXTENDED_RELEASE_TABLET | ORAL | Status: AC
  Administered 2013-07-12 (×2): 40 meq via ORAL
  Filled 2013-07-12 (×2): qty 2

## 2013-07-12 MED ORDER — HYDROMORPHONE HCL PF 1 MG/ML IJ SOLN
1.0000 mg | INTRAMUSCULAR | Status: DC | PRN
  Administered 2013-07-12 – 2013-07-14 (×11): 1 mg via INTRAVENOUS
  Filled 2013-07-12 (×11): qty 1

## 2013-07-12 MED ORDER — IPRATROPIUM-ALBUTEROL 0.5-2.5 (3) MG/3ML IN SOLN
3.0000 mL | RESPIRATORY_TRACT | Status: DC
  Administered 2013-07-12 – 2013-07-13 (×9): 3 mL via RESPIRATORY_TRACT
  Filled 2013-07-12 (×9): qty 3

## 2013-07-12 MED ORDER — SODIUM CHLORIDE 0.9 % IJ SOLN
3.0000 mL | Freq: Two times a day (BID) | INTRAMUSCULAR | Status: DC
  Administered 2013-07-12 – 2013-07-15 (×6): 3 mL via INTRAVENOUS

## 2013-07-12 MED ORDER — SODIUM CHLORIDE 0.9 % IV SOLN
250.0000 mL | INTRAVENOUS | Status: DC | PRN
  Administered 2013-07-12: 250 mL via INTRAVENOUS

## 2013-07-12 NOTE — Progress Notes (Signed)
Pt si wheezing in her neck audilbly and her lung ar clear and diminished

## 2013-07-12 NOTE — H&P (Signed)
[facility] PROGRESS NOTE  Kerry Richardson ZOX:[id number] DOB: 1940/05/19 DOA: 07/09/2013 PCP: Harlow Asa, MD  Interim summary  73 year old female with a history of cholecystitis status post laparoscopic cholecystectomy on 05/15/2013 (Dr. Lovell Sheehan) presented with acute onset of abdominal pain. Labs revealed unremarkable BMP. LFTs were normal. Lipase was greater than 3000. WBC was 12.8. CT of the abdomen and pelvis showed trace amount of fluid on the dome of the liver with low attenuation fluid in the gallbladder fossa. The patient was started on intravenous fluids with bowel rest. General surgery was consulted. They did not feel the patient had a bile leak. Unfortunately, the patient developed fluid overload with fluid resuscitation. The patient was started on intravenous furosemide   Assessment/Plan: Dyspnea/hypoxia Presumed acute CHF from fluid resuscitation for acute pancreatitis. chest xray without edema. Echo results EF 65% with grade 1 diastolic dysfunction.  Volume status +1.5L. Will give one more dose IV lasix and continue saline lock. Oxygen saturation down slightly. Continue supplemental oxygen. EKG--NSR, no ST-T changes. Will obtain CT angio for PE.   Acute respiratory Failure  - See #1. secondary to fluid overload/CHF presumabley. Some wheezes on exam. Will continue oxygen supplementation and one more dose lasix. Concern for PE.   Acute pancreatitis  - very slight improvement this am. Nausea improved but continues with pain. Lipase WNL  -Likely related to medications, particularly Premarin although microlithiasis cannot be ruled out. -However, patient has no biliary ductal dilatation on CT scan.Triglycerides--150.Contineu bowel rest with clear liquid. Pain control/antiemetics.Discontinue Premarin   Hypertension No history of same. SBP range 178-197. May be related to pain. Adjust pain meds monitor closely.   Diarrhea  Much improved this am. C. difficile PCR negative.    Dyslipidemia  -TChol 277, LDL 160  Gallbladder fossa fluid  -Suspect that this inflammatory fluid likely related to her pancreatitis  -Appreciate Dr. Gwyndolyn Saxon not feel pt had bile leak  Leukocytosis  - trending downward slightly. Likely due to the patient's acute medical condition.Urinalysis without pyuria.Blood cultures x 2 sets no growth. Empiric imipenem day #3. Afebrile.   -Continue to monitor    Code Status: full Family Communication: husband at bedside who is concerned she has not eaten in 4 days Disposition Plan: home when ready   Consultants:  General surgery  Procedures:    Antibiotics:  Imipenem 07/10/13>>  HPI/Subjective: Lying in bed in obvious pain. Reports some improvement. Not ready to advance diet. Pain medicine helps but does not last long enough.   Objective: Filed Vitals:   07/12/13 0505  BP: 186/95  Pulse: 101  Temp: 98.2 F (36.8 C)  Resp: 21    Intake/Output Summary (Last 24 hours) at 07/12/13 1034 Last data filed at 07/12/13 0800  Gross per 24 hour  Intake    480 ml  Output   1700 ml  Net  -1220 ml   Filed Weights   07/10/13 0452 07/11/13 1805 07/12/13 0505  Weight: 91.853 kg (202 lb 8 oz) 92.67 kg (204 lb 4.8 oz) 93.214 kg (205 lb 8 oz)    Exam:   General:  Obese somewhat uncomfortable appearing  Cardiovascular: RRR No MGR No LE edema  Respiratory: mild increased work of breathing. Diminished BS with faint wheeze  Abdomen: obese soft +BS non-tender to palpation  Musculoskeletal: MAE. No clubbing or cyanosis   Data Reviewed: Basic Metabolic Panel:  Recent Labs Lab 07/09/13 0557 07/10/13 0535 07/11/13 0556 07/12/13 0550  NA 140 140 138 137  K 3.6* 4.4 4.0 3.0*  CL 99 103 101 96  CO2 26 26 24 27   GLUCOSE 157* 109* 112* 116*  BUN 16 10 6  5*  CREATININE 0.69 0.64 0.56 0.51  CALCIUM 9.6 8.2* 8.3* 8.3*   Liver Function Tests:  Recent Labs Lab 07/09/13 0557 07/10/13 0535 07/11/13 0556 07/11/13 1924  AST 15  15 19 20   ALT 13 9 12 12   ALKPHOS 87 74 92 94  BILITOT 0.2* 0.3 0.5 0.5  PROT 8.0 7.0 7.0 7.0  ALBUMIN 3.7 3.1* 2.9* 3.0*    Recent Labs Lab 07/09/13 0557 07/12/13 0550  LIPASE >3000* 19   No results found for this basename: AMMONIA,  in the last 168 hours CBC:  Recent Labs Lab 07/09/13 0557 07/10/13 0535 07/11/13 0556 07/12/13 0550  WBC 12.8* 20.8* 20.7* 18.9*  NEUTROABS 9.6*  --   --   --   HGB 14.0 12.0 11.8* 11.3*  HCT 43.5 38.1 36.8 34.4*  MCV 89.3 90.5 90.2 88.4  PLT 342 300 287 258   Cardiac Enzymes:  Recent Labs Lab 07/09/13 0557  TROPONINI <0.30   BNP (last 3 results)  Recent Labs  07/11/13 0556  PROBNP 709.3*   CBG: No results found for this basename: GLUCAP,  in the last 168 hours  Recent Results (from the past 240 hour(s))  URINE CULTURE     Status: None   Collection Time    07/09/13  3:14 PM      Result Value Ref Range Status   Specimen Description URINE, CLEAN CATCH   Final   Special Requests NONE   Final   Culture  Setup Time     Final   Value: 07/09/2013 20:14     Performed at [facility]   Colony Count     Final   Value: 15,000 COLONIES/ML     [facility]formed at [facility]   Culture     Final   Value: Multiple bacterial morphotypes present, none predominant. Suggest appropriate recollection if clinically indicated.     Performed at [facility]   Report Status 07/10/2013 FINAL   Final  CULTURE, BLOOD (ROUTINE X 2)     Status: None   Collection Time    07/10/13  7:25 AM      Result Value Ref Range Status   Specimen Description BLOOD LEFT ARM   Final   Special Requests     Final   Value: BOTTLES DRAWN AEROBIC AND ANAEROBIC 8CC EACH BOTTLE   Culture NO GROWTH 1 DAY   Final   Report Status PENDING   Incomplete  CULTURE, BLOOD (ROUTINE X 2)     Status: None   Collection Time    07/10/13  7:25 AM      Result Value Ref Range Status   Specimen Description BLOOD LEFT ARM   Final   Special Requests     Final    Value: BOTTLES DRAWN AEROBIC AND ANAEROBIC 8CC EACH BOTTLE   Culture NO GROWTH 1 DAY   Final   Report Status PENDING   Incomplete  CLOSTRIDIUM DIFFICILE BY PCR     Status: None   Collection Time    07/11/13  7:00 PM      Result Value Ref Range Status   C difficile by pcr NEGATIVE  NEGATIVE Final     Studies: Dg Chest Port 1 View  07/11/2013   CLINICAL DATA:  Shortness of breath  EXAM: PORTABLE CHEST - 1 VIEW  COMPARISON:  DG CHEST 1  VIEW dated 07/09/2013  FINDINGS: Shallow inspiration. Heart size and pulmonary vascularity are normal for inspiratory effort. Linear atelectasis in the lung bases. No focal consolidation. No blunting of costophrenic angles. No pneumothorax. Postoperative changes in the cervical spine.  IMPRESSION: Shallow inspiration.  No evidence of active pulmonary disease.   Electronically Signed   By: Burman NievesWilliam  Stevens M.D.   On: 07/11/2013 05:43   Dg Chest Port 1v Same Day  07/11/2013   CLINICAL DATA:  Dyspnea  EXAM: PORTABLE CHEST - 1 VIEW SAME DAY  COMPARISON:  DG CHEST 1V PORT dated 07/11/2013  FINDINGS: Low volumes. Bibasilar atelectasis. Normal heart size. No pneumothorax.  IMPRESSION: Stable bibasilar atelectasis.   Electronically Signed   By: Maryclare BeanArt  Hoss M.D.   On: 07/11/2013 23:51    Scheduled Meds: . docusate sodium  100 mg Oral BID  . enoxaparin (LOVENOX) injection  40 mg Subcutaneous Daily  . imipenem-cilastatin  500 mg Intravenous 3 times per day  . ipratropium-albuterol  3 mL Nebulization Q4H  . potassium chloride  40 mEq Oral Q4H  . senna  1 tablet Oral BID  . sodium chloride  3 mL Intravenous Q12H   Continuous Infusions:   Active Problems:   Acute pancreatitis   Leukocytosis   Dyspnea   Dyslipidemia   Acute CHF   Acute respiratory failure   Hypokalemia   Unspecified essential hypertension    Time spent: 4335 mintues    Lesle ChrisKaren M [facility]  [facility] Pager [phone]. If 7PM-7AM, please contact night-coverage at [email or web address], password  [email or web address] 07/12/2013, 10:34 AM  LOS: 3 days       Patient seen and examined, database reviewed. Agree with above note by Toya SmothersKaren Black, NP. Here with acute pancreatitis. Had some SOB and tachycardia. CT Angio negative for PE. No signs of pulmonary edema on CXR or CT. Suspect related to pain and anxiety. Not ready to advance diet diet today. Will continue to follow for signs of clinical improvement.  Peggye PittEstela Hernandez, MD [facility] Pager: [phone]

## 2013-07-12 NOTE — Progress Notes (Signed)
  Subjective: Patient remained short of breath. Her abdominal pain has decreased.  Objective: Vital signs in last 24 hours: Temp:  [97.9 F (36.6 C)-98.8 F (37.1 C)] 98.2 F (36.8 C) (04/22 0505) Pulse Rate:  [95-107] 101 (04/22 0505) Resp:  [21-22] 21 (04/22 0505) BP: (160-197)/(57-95) 186/95 mmHg (04/22 0505) SpO2:  [92 %-99 %] 92 % (04/22 0705) Weight:  [92.67 kg (204 lb 4.8 oz)-93.214 kg (205 lb 8 oz)] 93.214 kg (205 lb 8 oz) (04/22 0505) Last BM Date: 07/11/13  Intake/Output from previous day: 04/21 0701 - 04/22 0700 In: 360 [P.O.:360] Out: 2850 [Urine:2850] Intake/Output this shift:    General appearance: alert, cooperative and flushed GI: Soft. Minimal tenderness in the epigastric region. No rigidity noted.  Lab Results:   Recent Labs  07/11/13 0556 07/12/13 0550  WBC 20.7* 18.9*  HGB 11.8* 11.3*  HCT 36.8 34.4*  PLT 287 258   BMET  Recent Labs  07/11/13 0556 07/12/13 0550  NA 138 137  K 4.0 3.0*  CL 101 96  CO2 24 27  GLUCOSE 112* 116*  BUN 6 5*  CREATININE 0.56 0.51  CALCIUM 8.3* 8.3*   PT/INR No results found for this basename: LABPROT, INR,  in the last 72 hours  Studies/Results: Dg Chest Port 1 View  07/11/2013   CLINICAL DATA:  Shortness of breath  EXAM: PORTABLE CHEST - 1 VIEW  COMPARISON:  DG CHEST 1 VIEW dated 07/09/2013  FINDINGS: Shallow inspiration. Heart size and pulmonary vascularity are normal for inspiratory effort. Linear atelectasis in the lung bases. No focal consolidation. No blunting of costophrenic angles. No pneumothorax. Postoperative changes in the cervical spine.  IMPRESSION: Shallow inspiration.  No evidence of active pulmonary disease.   Electronically Signed   By: Burman NievesWilliam  Stevens M.D.   On: 07/11/2013 05:43   Dg Chest Port 1v Same Day  07/11/2013   CLINICAL DATA:  Dyspnea  EXAM: PORTABLE CHEST - 1 VIEW SAME DAY  COMPARISON:  DG CHEST 1V PORT dated 07/11/2013  FINDINGS: Low volumes. Bibasilar atelectasis. Normal heart  size. No pneumothorax.  IMPRESSION: Stable bibasilar atelectasis.   Electronically Signed   By: Maryclare BeanArt  Hoss M.D.   On: 07/11/2013 23:51    Anti-infectives: Anti-infectives   Start     Dose/Rate Route Frequency Ordered Stop   07/10/13 1400  imipenem-cilastatin (PRIMAXIN) 500 mg in sodium chloride 0.9 % 100 mL IVPB     500 mg 200 mL/hr over 30 Minutes Intravenous 3 times per day 07/10/13 0947        Assessment/Plan: Impression: Pancreatitis, resolving. Lipase within normal limits. Liver enzyme tests within normal limits. Fluid overload is being addressed by medicine. Plan: May advance diet as warranted from my standpoint. We'll continue to follow peripherally with you.  LOS: 3 days    Dalia HeadingMark A Mabrey Howland 07/12/2013

## 2013-07-13 DIAGNOSIS — I5031 Acute diastolic (congestive) heart failure: Secondary | ICD-10-CM | POA: Diagnosis not present

## 2013-07-13 LAB — BASIC METABOLIC PANEL
BUN: 5 mg/dL — ABNORMAL LOW (ref 6–23)
CALCIUM: 8.4 mg/dL (ref 8.4–10.5)
CO2: 28 meq/L (ref 19–32)
CREATININE: 0.56 mg/dL (ref 0.50–1.10)
Chloride: 96 mEq/L (ref 96–112)
GFR calc Af Amer: >90 mL/min (ref >90)
GLUCOSE: 111 mg/dL — AB (ref 70–99)
Potassium: 3.5 mEq/L — ABNORMAL LOW (ref 3.7–5.3)
Sodium: 136 mEq/L — ABNORMAL LOW (ref 137–147)

## 2013-07-13 LAB — CBC
HCT: 32.5 % — ABNORMAL LOW (ref 36.0–46.0)
Hemoglobin: 10.7 g/dL — ABNORMAL LOW (ref 12.0–15.0)
MCH: 29 pg (ref 26.0–34.0)
MCHC: 32.9 g/dL (ref 30.0–36.0)
MCV: 88.1 fL (ref 78.0–100.0)
Platelets: 273 10*3/uL (ref 150–400)
RBC: 3.69 MIL/uL — AB (ref 3.87–5.11)
RDW: 15.1 % (ref 11.5–15.5)
WBC: 15.8 10*3/uL — ABNORMAL HIGH (ref 4.0–10.5)

## 2013-07-13 LAB — CLOSTRIDIUM DIFFICILE BY PCR: Toxigenic C. Difficile by PCR: NEGATIVE

## 2013-07-13 MED ORDER — HYDRALAZINE HCL 20 MG/ML IJ SOLN
5.0000 mg | Freq: Four times a day (QID) | INTRAMUSCULAR | Status: DC | PRN
  Administered 2013-07-14 (×2): 5 mg via INTRAVENOUS
  Filled 2013-07-13 (×2): qty 1

## 2013-07-13 MED ORDER — IPRATROPIUM-ALBUTEROL 0.5-2.5 (3) MG/3ML IN SOLN
3.0000 mL | Freq: Three times a day (TID) | RESPIRATORY_TRACT | Status: DC
  Administered 2013-07-14 (×3): 3 mL via RESPIRATORY_TRACT
  Filled 2013-07-13 (×3): qty 3

## 2013-07-13 NOTE — Progress Notes (Signed)
[facility] PROGRESS NOTE  Kerry Richardson ZOX:[id number] DOB: 06-Jan-1941 DOA: 07/09/2013 PCP: Harlow AsaLUKING,W S, MD  Interim summary  73 year old female with a history of cholecystitis status post laparoscopic cholecystectomy on 05/15/2013 (Dr. Lovell SheehanJenkins) presented with acute onset of abdominal pain. Labs revealed unremarkable BMP. LFTs were normal. Lipase was greater than 3000. WBC was 12.8. CT of the abdomen and pelvis showed trace amount of fluid on the dome of the liver with low attenuation fluid in the gallbladder fossa. The patient was started on intravenous fluids with bowel rest. General surgery was consulted. They did not feel the patient had a bile leak. Unfortunately, the patient developed fluid overload with fluid resuscitation. The patient was started on intravenous furosemide. Remained dyspneic with slight decrease in sats. CT chest negative for PE.   Assessment/Plan: Dyspnea/hypoxia/Acute Diastolic CHF Presumed acute CHF from fluid resuscitation for acute pancreatitis. chest xray without edema. Echo results EF 65% with grade 1 diastolic dysfunction. Volume status +4L. Lasix 40mg  x2 given. EKG--NSR, no ST-T changes. CT angio negative for PE. sats 99% on 2L. Will wean oxygen. Some DOE but normal effort at rest.  Acute respiratory Failure  - See #1. secondary to fluid overload/CHF presumabley. Some improvement this am. Will wean oxygen. Will ambulate in hall and monitor effort and oxygen saturation level.   Acute pancreatitis  - Improved this am. Will advance diet to full liquid. Likely related to medications, particularly Premarin. This is on hold.  Pain control/antiemetics.  Hypertension  No history of same. SBP range 178-197. Likely related to pain. Adjust pain meds monitor closely. Hydralazine prn.  Diarrhea  Normal BM this am. C. difficile PCR negative.  Dyslipidemia  -TChol 277, LDL 160  Gallbladder fossa fluid  -Suspect that this inflammatory fluid likely related to her  pancreatitis  -Appreciate Dr. Gwyndolyn SaxonJenkins--did not feel pt had bile leak  Leukocytosis  - continues to trend downward.. Likely due to the patient's acute medical condition.Urinalysis without pyuria.Blood cultures x 2 sets no growth. Empiric imipenem day #4. Afebrile. Continue to monitor    Code Status: full Family Communication: none present Disposition Plan: home hopefully 24-48 hours   Consultants:  General surgery  Procedures:  none  Antibiotics:  Imipenem 07/10/13>>>  HPI/Subjective: Ambulating in room with steady gait. Some DOE. Reports BM this am. Reports some improvement in pain and "starting to feel a little hungry".   Objective: Filed Vitals:   07/13/13 0429  BP: 188/92  Pulse: 107  Temp: 98.3 F (36.8 C)  Resp: 20    Intake/Output Summary (Last 24 hours) at 07/13/13 0853 Last data filed at 07/13/13 0836  Gross per 24 hour  Intake 5696.75 ml  Output   2050 ml  Net 3646.75 ml   Filed Weights   07/12/13 0505 07/13/13 0429 07/13/13 0436  Weight: 93.214 kg (205 lb 8 oz) 92.534 kg (204 lb) 92.7 kg (204 lb 5.9 oz)    Exam:   General:  Obese NAD  Cardiovascular: tachycardic but regular no m/g/r no LE edema  Respiratory: increased work of breathing with ambulation. BS distant slightly coarse. No wheeze  Abdomen: obese non-distended, non-tender. +BS x4   Musculoskeletal: good muscle tone no clubbing or cyanosis   Data Reviewed: Basic Metabolic Panel:  Recent Labs Lab 07/09/13 0557 07/10/13 0535 07/11/13 0556 07/12/13 0550 07/13/13 0607  NA 140 140 138 137 136*  K 3.6* 4.4 4.0 3.0* 3.5*  CL 99 103 101 96 96  CO2 26 26 24 27 28   GLUCOSE 157* 109*  112* 116* 111*  BUN 16 10 6  5* 5*  CREATININE 0.69 0.64 0.56 0.51 0.56  CALCIUM 9.6 8.2* 8.3* 8.3* 8.4  MG  --   --   --  2.0  --    Liver Function Tests:  Recent Labs Lab 07/09/13 0557 07/10/13 0535 07/11/13 0556 07/11/13 1924  AST 15 15 19 20   ALT 13 9 12 12   ALKPHOS 87 74 92 94  BILITOT  0.2* 0.3 0.5 0.5  PROT 8.0 7.0 7.0 7.0  ALBUMIN 3.7 3.1* 2.9* 3.0*    Recent Labs Lab 07/09/13 0557 07/12/13 0550  LIPASE >3000* 19   No results found for this basename: AMMONIA,  in the last 168 hours CBC:  Recent Labs Lab 07/09/13 0557 07/10/13 0535 07/11/13 0556 07/12/13 0550 07/13/13 0607  WBC 12.8* 20.8* 20.7* 18.9* 15.8*  NEUTROABS 9.6*  --   --   --   --   HGB 14.0 12.0 11.8* 11.3* 10.7*  HCT 43.5 38.1 36.8 34.4* 32.5*  MCV 89.3 90.5 90.2 88.4 88.1  PLT 342 300 287 258 273   Cardiac Enzymes:  Recent Labs Lab 07/09/13 0557  TROPONINI <0.30   BNP (last 3 results)  Recent Labs  07/11/13 0556  PROBNP 709.3*   CBG: No results found for this basename: GLUCAP,  in the last 168 hours  Recent Results (from the past 240 hour(s))  URINE CULTURE     Status: None   Collection Time    07/09/13  3:14 PM      Result Value Ref Range Status   Specimen Description URINE, CLEAN CATCH   Final   Special Requests NONE   Final   Culture  Setup Time     Final   Value: 07/09/2013 20:14     Performed at [facility] Count     Final   Value: 15,000 COLONIES/ML     Performed at [facility]   Culture     Final   Value: Multiple bacterial morphotypes present, none predominant. Suggest appropriate recollection if clinically indicated.     Performed at [facility]   Report Status 07/10/2013 FINAL   Final  CULTURE, BLOOD (ROUTINE X 2)     Status: None   Collection Time    07/10/13  7:25 AM      Result Value Ref Range Status   Specimen Description BLOOD LEFT ARM   Final   Special Requests     Final   Value: BOTTLES DRAWN AEROBIC AND ANAEROBIC 8CC EACH BOTTLE   Culture NO GROWTH 2 DAYS   Final   Report Status PENDING   Incomplete  CULTURE, BLOOD (ROUTINE X 2)     Status: None   Collection Time    07/10/13  7:25 AM      Result Value Ref Range Status   Specimen Description BLOOD LEFT ARM   Final   Special Requests     Final   Value:  BOTTLES DRAWN AEROBIC AND ANAEROBIC 8CC EACH BOTTLE   Culture NO GROWTH 2 DAYS   Final   Report Status PENDING   Incomplete  CLOSTRIDIUM DIFFICILE BY PCR     Status: None   Collection Time    07/11/13  7:00 PM      Result Value Ref Range Status   C difficile by pcr NEGATIVE  NEGATIVE Final     Studies: Ct Angio Chest Pe W/cm &/or Wo Cm  07/12/2013   CLINICAL DATA:  Congestive heart failure.  Shortness of breath.  EXAM: CT ANGIOGRAPHY CHEST WITH CONTRAST  TECHNIQUE: Multidetector CT imaging of the chest was performed using the standard protocol during bolus administration of intravenous contrast. Multiplanar CT image reconstructions and MIPs were obtained to evaluate the vascular anatomy.  CONTRAST:  100mL OMNIPAQUE IOHEXOL 350 MG/ML SOLN  COMPARISON:  07/11/2013  FINDINGS: No filling defect is identified in the pulmonary arterial tree to suggest pulmonary embolus. No acute aortic abnormality is identified.  Small to moderate left and small right pleural effusions with passive atelectasis.  Right hilar node 1.1 cm in short axis. Left hilar lymph node 0.7 cm in short axis.  Mild stranding above the pancreas could reflect pancreatitis but is only partially included on today's exam.  Review of the MIP images confirms the above findings.  IMPRESSION: 1. Bilateral pleural effusions with passive atelectasis. 2. No embolus or aortic dissection identified. 3. Stranding in the adipose tissue but the pancreas could be a secondary sign of pancreatitis -correlate with clinical history. 4. Mildly enlarged right hilar lymph node, nonspecific, with differential diagnostic considerations favoring benign etiology such as reactive lymph node or passive congestion over malignancy.   Electronically Signed   By: Herbie BaltimoreWalt  Liebkemann M.D.   On: 07/12/2013 16:09   Dg Chest Port 1v Same Day  07/11/2013   CLINICAL DATA:  Dyspnea  EXAM: PORTABLE CHEST - 1 VIEW SAME DAY  COMPARISON:  DG CHEST 1V PORT dated 07/11/2013  FINDINGS: Low  volumes. Bibasilar atelectasis. Normal heart size. No pneumothorax.  IMPRESSION: Stable bibasilar atelectasis.   Electronically Signed   By: Maryclare BeanArt  Hoss M.D.   On: 07/11/2013 23:51    Scheduled Meds: . docusate sodium  100 mg Oral BID  . enoxaparin (LOVENOX) injection  40 mg Subcutaneous Daily  . imipenem-cilastatin  500 mg Intravenous 3 times per day  . ipratropium-albuterol  3 mL Nebulization Q4H  . senna  1 tablet Oral BID  . sodium chloride  3 mL Intravenous Q12H   Continuous Infusions:   Active Problems:   Acute pancreatitis   Leukocytosis   Dyspnea   Dyslipidemia   Hypoxemia   Acute respiratory failure   Hypokalemia   Unspecified essential hypertension    Time spent: 30 minutes    Lesle ChrisKaren M [facility]  [facility] Pager [phone]. If 7PM-7AM, please contact night-coverage at [email or web address], password [email or web address] 07/13/2013, 8:53 AM  LOS: 4 days     Patient seen and examined, database reviewed. Agree with above note by Toya SmothersKaren Black, NP. Patient's pain has been better controlled today and has tolerated a full liquid diet. Has had 5 liquid BMs today. Sample has been sent for c Diff. Earlier sample on the 21st was negative. Will continue to advance diet and plan for DC home in next 1-2 days.  Peggye PittEstela Hernandez, MD [facility] Pager: [phone]

## 2013-07-14 DIAGNOSIS — E871 Hypo-osmolality and hyponatremia: Secondary | ICD-10-CM | POA: Diagnosis not present

## 2013-07-14 LAB — BASIC METABOLIC PANEL
BUN: 6 mg/dL (ref 6–23)
CALCIUM: 8.5 mg/dL (ref 8.4–10.5)
CO2: 30 meq/L (ref 19–32)
CREATININE: 0.48 mg/dL — AB (ref 0.50–1.10)
Chloride: 94 mEq/L — ABNORMAL LOW (ref 96–112)
GFR calc Af Amer: >90 mL/min (ref >90)
GFR calc non Af Amer: >90 mL/min (ref >90)
GLUCOSE: 108 mg/dL — AB (ref 70–99)
Potassium: 3.4 mEq/L — ABNORMAL LOW (ref 3.7–5.3)
Sodium: 135 mEq/L — ABNORMAL LOW (ref 137–147)

## 2013-07-14 LAB — CBC
HCT: 33.3 % — ABNORMAL LOW (ref 36.0–46.0)
Hemoglobin: 10.8 g/dL — ABNORMAL LOW (ref 12.0–15.0)
MCH: 28.6 pg (ref 26.0–34.0)
MCHC: 32.4 g/dL (ref 30.0–36.0)
MCV: 88.1 fL (ref 78.0–100.0)
PLATELETS: 282 10*3/uL (ref 150–400)
RBC: 3.78 MIL/uL — ABNORMAL LOW (ref 3.87–5.11)
RDW: 14.9 % (ref 11.5–15.5)
WBC: 16.8 10*3/uL — ABNORMAL HIGH (ref 4.0–10.5)

## 2013-07-14 MED ORDER — HYDROMORPHONE HCL PF 1 MG/ML IJ SOLN: 1.0000 mg | INTRAMUSCULAR | Status: DC | PRN

## 2013-07-14 MED ORDER — IPRATROPIUM-ALBUTEROL 0.5-2.5 (3) MG/3ML IN SOLN: 3.0000 mL | RESPIRATORY_TRACT | Status: DC | PRN

## 2013-07-14 MED ORDER — POTASSIUM CHLORIDE CRYS ER 20 MEQ PO TBCR
40.0000 meq | EXTENDED_RELEASE_TABLET | Freq: Once | ORAL | Status: AC
  Administered 2013-07-14: 40 meq via ORAL
  Filled 2013-07-14: qty 2

## 2013-07-14 MED ORDER — HYDROMORPHONE HCL PF 1 MG/ML IJ SOLN: 1.0000 mg | Freq: Four times a day (QID) | INTRAMUSCULAR | Status: DC | PRN

## 2013-07-14 MED ORDER — HYDROCODONE-ACETAMINOPHEN 5-325 MG PO TABS
1.0000 | ORAL_TABLET | ORAL | Status: DC | PRN
  Administered 2013-07-14 – 2013-07-15 (×5): 1 via ORAL
  Filled 2013-07-14: qty 2
  Filled 2013-07-14 (×3): qty 1
  Filled 2013-07-14: qty 2
  Filled 2013-07-14: qty 1

## 2013-07-14 NOTE — Progress Notes (Signed)
[facility] PROGRESS NOTE  Kerry Richardson:[id number] DOB: 10-23-1940 DOA: 07/09/2013 PCP: Harlow Asa, MD  Interim summary  73 year old female with a history of cholecystitis status post laparoscopic cholecystectomy on 05/15/2013 (Dr. Lovell Sheehan) presented with acute onset of abdominal pain. Labs revealed unremarkable BMP. LFTs were normal. Lipase was greater than 3000. WBC was 12.8. CT of the abdomen and pelvis showed trace amount of fluid on the dome of the liver with low attenuation fluid in the gallbladder fossa. The patient was started on intravenous fluids with bowel rest. General surgery was consulted. They did not feel the patient had a bile leak. Unfortunately, the patient developed fluid overload with fluid resuscitation. The patient was started on intravenous furosemide. Remained dyspneic with slight decrease in sats. CT chest negative for PE  Assessment/Plan: Dyspnea/hypoxia/Acute Diastolic CHF  Presumed acute CHF from fluid resuscitation for acute pancreatitis. chest xray without edema. Echo results EF 65% with grade 1 diastolic dysfunction. Volume status +6L.  EKG--NSR, no ST-T changes. CT angio negative for PE. sats 93% on room air. Some DOE but normal effort at rest. Encourage IS Acute respiratory Failure  - See #1. secondary to fluid overload/CHF presumabley. resolved Will ambulate in hall and monitor effort and oxygen saturation level.  Acute pancreatitis  - continues to improve. Complains she "doesnt like the food given".  Will advance diet to bland. Will transition pain med to po with IV only for breakthrough.  Likely related to medications, particularly Premarin. This is on hold. Monitor. Imipenem day #5 Hypertension  No history of same. SBP range 165-188. Likely related to pain. Adjust pain meds monitor closely. Hydralazine prn.  Diarrhea  Hx IBS. Second C. difficile PCR negative.  Dyslipidemia  -TChol 277, LDL 160  Gallbladder fossa fluid  -Suspect that this  inflammatory fluid likely related to her pancreatitis  -Appreciate Dr. Gwyndolyn Saxon not feel pt had bile leak  Leukocytosis  - trending up somewhat today. . Likely due to the patient's acute medical condition.Urinalysis without pyuria.Blood cultures x 2 sets no growth. Empiric imipenem day #5. Afebrile and non-toxic appearing. Continue to monitor  Hyponatremia Mild but trending down over last 2 days. Likely related to volume overload followed by diuresis. Taking po fluids well. Will recheck in am.   Code Status: full Family Communication: none present Disposition Plan: home hopefully 24 hours   Consultants:  General surger  Procedures: Echo 07/11/13 Systolic function was normal. The estimated ejection fraction was in the range of 60% to 65%. Wall motion was normal; there were no regional wall motion abnormalities. Doppler parameters are consistent with abnormal left ventricular relaxation(grade 1 diastolic dysfunction).    Antibiotics:  Imipenem 07/10/13>>>  HPI/Subjective: Sitting on side of bed eating breakfast. Complaining that she does not like the food given. Denies nausea. Pain improved  Objective: Filed Vitals:   07/14/13 0552  BP: 165/81  Pulse: 96  Temp: 98 F (36.7 C)  Resp: 20    Intake/Output Summary (Last 24 hours) at 07/14/13 0923 Last data filed at 07/13/13 1541  Gross per 24 hour  Intake    340 ml  Output      0 ml  Net    340 ml   Filed Weights   07/13/13 0429 07/13/13 0436 07/14/13 0453  Weight: 92.534 kg (204 lb) 92.7 kg (204 lb 5.9 oz) 91.309 kg (201 lb 4.8 oz)    Exam:   General:  Obese smiling NAD  Cardiovascular: RRR No MGR no LE edema  Respiratory: mild increased  work of breathing with ambulation. BS with good air flow and fine crackles bilateral bases. No wheeze  Abdomen: obese soft +BS  Non-tender and non-distended  Musculoskeletal: no clubbing or cyanosis.   Data Reviewed: Basic Metabolic Panel:  Recent Labs Lab  07/10/13 0535 07/11/13 0556 07/12/13 0550 07/13/13 0607 07/14/13 0614  NA 140 138 137 136* 135*  K 4.4 4.0 3.0* 3.5* 3.4*  CL 103 101 96 96 94*  CO2 26 24 27 28 30   GLUCOSE 109* 112* 116* 111* 108*  BUN 10 6 5* 5* 6  CREATININE 0.64 0.56 0.51 0.56 0.48*  CALCIUM 8.2* 8.3* 8.3* 8.4 8.5  MG  --   --  2.0  --   --    Liver Function Tests:  Recent Labs Lab 07/09/13 0557 07/10/13 0535 07/11/13 0556 07/11/13 1924  AST 15 15 19 20   ALT 13 9 12 12   ALKPHOS 87 74 92 94  BILITOT 0.2* 0.3 0.5 0.5  PROT 8.0 7.0 7.0 7.0  ALBUMIN 3.7 3.1* 2.9* 3.0*    Recent Labs Lab 07/09/13 0557 07/12/13 0550  LIPASE >3000* 19   No results found for this basename: AMMONIA,  in the last 168 hours CBC:  Recent Labs Lab 07/09/13 0557 07/10/13 0535 07/11/13 0556 07/12/13 0550 07/13/13 0607 07/14/13 0614  WBC 12.8* 20.8* 20.7* 18.9* 15.8* 16.8*  NEUTROABS 9.6*  --   --   --   --   --   HGB 14.0 12.0 11.8* 11.3* 10.7* 10.8*  HCT 43.5 38.1 36.8 34.4* 32.5* 33.3*  MCV 89.3 90.5 90.2 88.4 88.1 88.1  PLT 342 300 287 258 273 282   Cardiac Enzymes:  Recent Labs Lab 07/09/13 0557  TROPONINI <0.30   BNP (last 3 results)  Recent Labs  07/11/13 0556  PROBNP 709.3*   CBG: No results found for this basename: GLUCAP,  in the last 168 hours  Recent Results (from the past 240 hour(s))  URINE CULTURE     Status: None   Collection Time    07/09/13  3:14 PM      Result Value Ref Range Status   Specimen Description URINE, CLEAN CATCH   Final   Special Requests NONE   Final   Culture  Setup Time     Final   Value: 07/09/2013 20:14     Performed at [facility]   Colony Count     Final   Value: 15,000 COLONIES/ML     Performed at [facility]   Culture     Final   Value: Multiple bacterial morphotypes present, none predominant. Suggest appropriate recollection if clinically indicated.     Performed at [facility]   Report Status 07/10/2013 FINAL   Final   CULTURE, BLOOD (ROUTINE X 2)     Status: None   Collection Time    07/10/13  7:25 AM      Result Value Ref Range Status   Specimen Description BLOOD LEFT ARM   Final   Special Requests     Final   Value: BOTTLES DRAWN AEROBIC AND ANAEROBIC 8CC EACH BOTTLE   Culture NO GROWTH 3 DAYS   Final   Report Status PENDING   Incomplete  CULTURE, BLOOD (ROUTINE X 2)     Status: None   Collection Time    07/10/13  7:25 AM      Result Value Ref Range Status   Specimen Description BLOOD LEFT ARM   Final   Special Requests  Final   Value: BOTTLES DRAWN AEROBIC AND ANAEROBIC 8CC EACH BOTTLE   Culture NO GROWTH 3 DAYS   Final   Report Status PENDING   Incomplete  CLOSTRIDIUM DIFFICILE BY PCR     Status: None   Collection Time    07/11/13  7:00 PM      Result Value Ref Range Status   C difficile by pcr NEGATIVE  NEGATIVE Final  CLOSTRIDIUM DIFFICILE BY PCR     Status: None   Collection Time    07/13/13  2:25 PM      Result Value Ref Range Status   C difficile by pcr NEGATIVE  NEGATIVE Final     Studies: Ct Angio Chest Pe W/cm &/or Wo Cm  07/12/2013   CLINICAL DATA:  Congestive heart failure.  Shortness of breath.  EXAM: CT ANGIOGRAPHY CHEST WITH CONTRAST  TECHNIQUE: Multidetector CT imaging of the chest was performed using the standard protocol during bolus administration of intravenous contrast. Multiplanar CT image reconstructions and MIPs were obtained to evaluate the vascular anatomy.  CONTRAST:  100mL OMNIPAQUE IOHEXOL 350 MG/ML SOLN  COMPARISON:  07/11/2013  FINDINGS: No filling defect is identified in the pulmonary arterial tree to suggest pulmonary embolus. No acute aortic abnormality is identified.  Small to moderate left and small right pleural effusions with passive atelectasis.  Right hilar node 1.1 cm in short axis. Left hilar lymph node 0.7 cm in short axis.  Mild stranding above the pancreas could reflect pancreatitis but is only partially included on today's exam.  Review of the  MIP images confirms the above findings.  IMPRESSION: 1. Bilateral pleural effusions with passive atelectasis. 2. No embolus or aortic dissection identified. 3. Stranding in the adipose tissue but the pancreas could be a secondary sign of pancreatitis -correlate with clinical history. 4. Mildly enlarged right hilar lymph node, nonspecific, with differential diagnostic considerations favoring benign etiology such as reactive lymph node or passive congestion over malignancy.   Electronically Signed   By: Herbie BaltimoreWalt  Liebkemann M.D.   On: 07/12/2013 16:09    Scheduled Meds: . docusate sodium  100 mg Oral BID  . enoxaparin (LOVENOX) injection  40 mg Subcutaneous Daily  . imipenem-cilastatin  500 mg Intravenous 3 times per day  . ipratropium-albuterol  3 mL Nebulization TID  . potassium chloride  40 mEq Oral Once  . senna  1 tablet Oral BID  . sodium chloride  3 mL Intravenous Q12H   Continuous Infusions:   Active Problems:   Acute pancreatitis   Leukocytosis   Dyspnea   Dyslipidemia   Hypoxemia   Acute respiratory failure   Hypokalemia   Unspecified essential hypertension   Acute diastolic CHF (congestive heart failure)   Hyponatremia    Time spent: 35 minutes    Lesle ChrisKaren M [facility]  [facility] Pager [phone]. If 7PM-7AM, please contact night-coverage at [email or web address], password [email or web address] 07/14/2013, 9:23 AM  LOS: 5 days      Patient seen and examined, database reviewed. Agree with above note by Toya SmothersKaren Black, NP. Continue to advance diet. Hopeful for DC home in am.  Peggye PittEstela Hernandez, MD [facility] Pager: [phone]

## 2013-07-14 NOTE — Progress Notes (Signed)
Attempted to walk patient and check O2 stat on RA at this time.  Patient stated that she "can not go right now, i've been sweating profusely for the past hour"  Patient does not appear or feel diaphoretic.  Room temp at 73 right now, moved thermostat down. Temperature 97.9 PO and O2 stat 95% on RA at rest.  Will attempt to walk again when patient cools down.

## 2013-07-14 NOTE — Progress Notes (Signed)
Patient ambulated hallways with stand by assist.  O2 stats on RA maintained at 91-96% for majority of walk.  Did drop to 89% when patient stopped to talk just prior going back to room, but quickly came back up to 93%.  Patient with slight SOB, but no distress noted.  Tolerate well..Marland Kitchen

## 2013-07-14 NOTE — Progress Notes (Signed)
Have decreased PTS nebs to prn, NO wheezes noted , PT not short of breath

## 2013-07-14 NOTE — Progress Notes (Addendum)
Nutrition Brief Note  RD pulled to chart due to LOS  Wt Readings from Last 15 Encounters:  07/14/13 201 lb 4.8 oz (91.309 kg)  05/15/13 190 lb (86.183 kg)  05/15/13 190 lb (86.183 kg)  05/08/13 190 lb (86.183 kg)  03/21/13 194 lb (87.998 kg)  02/28/13 194 lb (87.998 kg)  02/22/13 201 lb 9.6 oz (91.445 kg)  01/19/13 194 lb (87.998 kg)  12/26/12 194 lb (87.998 kg)  12/23/12 197 lb (89.359 kg)  12/23/12 197 lb (89.359 kg)  12/16/12 197 lb 9.6 oz (89.631 kg)  12/01/12 194 lb (87.998 kg)  11/10/12 194 lb (87.998 kg)  11/03/12 194 lb (87.998 kg)    Body mass index is 38.05 kg/(m^2). Patient meets criteria for obesity, class II based on current BMI.   Current diet order is full liquid, patient is consuming approximately 25-100% of meals at this time. Labs and medications reviewed.   No nutrition interventions warranted at this time. If nutrition issues arise, please consult RD.   Netty Sullivant A. Mayford KnifeWilliams, RD, LDN Pager: [phone]

## 2013-07-15 DIAGNOSIS — I5031 Acute diastolic (congestive) heart failure: Secondary | ICD-10-CM

## 2013-07-15 LAB — BASIC METABOLIC PANEL
BUN: 6 mg/dL (ref 6–23)
CO2: 30 mEq/L (ref 19–32)
Calcium: 8.9 mg/dL (ref 8.4–10.5)
Chloride: 96 mEq/L (ref 96–112)
Creatinine, Ser: 0.41 mg/dL — ABNORMAL LOW (ref 0.50–1.10)
GFR calc Af Amer: >90 mL/min (ref >90)
Glucose, Bld: 132 mg/dL — ABNORMAL HIGH (ref 70–99)
POTASSIUM: 3.9 meq/L (ref 3.7–5.3)
SODIUM: 139 meq/L (ref 137–147)

## 2013-07-15 LAB — CULTURE, BLOOD (ROUTINE X 2)
CULTURE: NO GROWTH
Culture: NO GROWTH

## 2013-07-15 LAB — CBC
HCT: 34.4 % — ABNORMAL LOW (ref 36.0–46.0)
Hemoglobin: 11.4 g/dL — ABNORMAL LOW (ref 12.0–15.0)
MCH: 28.6 pg (ref 26.0–34.0)
MCHC: 33.1 g/dL (ref 30.0–36.0)
MCV: 86.4 fL (ref 78.0–100.0)
Platelets: 361 10*3/uL (ref 150–400)
RBC: 3.98 MIL/uL (ref 3.87–5.11)
RDW: 15 % (ref 11.5–15.5)
WBC: 16 10*3/uL — ABNORMAL HIGH (ref 4.0–10.5)

## 2013-07-15 MED ORDER — HYDROCODONE-ACETAMINOPHEN 5-325 MG PO TABS: 1.0000 | ORAL_TABLET | ORAL | Status: DC | PRN

## 2013-07-15 NOTE — Discharge Summary (Signed)
Physician Discharge Summary  Kerry Richardson ZOX:[id number] DOB: March 27, 1940 DOA: 07/09/2013  PCP: Harlow Asa, MD  Admit date: 07/09/2013 Discharge date: 07/15/2013  Time spent: 45 minutes  Recommendations for Outpatient Follow-up:  -Will be discharged home today. -Advised to follow up with PCP in 2 weeks. -Premarin has been discontinued given risk of pancreatitis.   Discharge Diagnoses:  Active Problems:   Acute pancreatitis   Leukocytosis   Dyspnea   Dyslipidemia   Hypoxemia   Acute respiratory failure   Hypokalemia   Unspecified essential hypertension   Acute diastolic CHF (congestive heart failure)   Hyponatremia   Discharge Condition: Stable and improved  Filed Weights   07/13/13 0436 07/14/13 0453 07/15/13 0500  Weight: 92.7 kg (204 lb 5.9 oz) 91.309 kg (201 lb 4.8 oz) 90.719 kg (200 lb)    History of present illness:  73 year old female with a history of cholecystitis status post laparoscopic cholecystectomy on 05/15/2013 (Dr. Lovell Sheehan) presents with acute onset of abdominal pain that woke her up from sleep around 3:30 in the morning on 07/09/2013. The patient did complain of some nausea without any vomiting. The patient has been in her usual state of health without any abdominal pain prior to the morning of admission. She states that she has been eating fine and having normal bowel movements without any hematochezia or melena. She has some subjective fevers with diaphoresis on the morning of admission. She denies any chest pain but states that she has some shortness of breath likely resulting from her epigastric pain. The patient has not had any new medications except for a short course of Norco after her cholecystectomy. She denies any NSAID use. She does not use any over-the-counter supplements. The patient denies any alcohol or illegal drug use.  In the emergency department, the patient was given morphine 4 mg, fentanyl 50 mg, and to individual doses of Dilaudid 1 mg  each. She was given 1 L normal saline. Labs revealed unremarkable BMP. LFTs were normal. Lipase was greater than 3000. WBC was 12.8. CT of the abdomen and pelvis showed trace amount of fluid on the dome of the liver with low attenuation fluid in the gallbladder fossa. There was mild edema at the head of the pancreas with fluid tracking posteriorly to the body and tail. Hospitalist admission was requested.   Hospital Course:   Acute Pancreatitis -Resolved. -Tolerating a solid diet without n/v/abdominal pain. -Presumed related to premarin. Will discontinue for now.  Dyspnea/Acute Diastolic CHF -Resolved. -Likely due to fluid rescucitation from acute pancreatitis. -ECHO: EF 65% with grade 1 diastolic dysfunction.. -CT angio chest was negative for PE.  Diarrhea -C Diff PCR negative x 2. -Likely related to antibiotics and pancreatitis. -Improving.  Procedures:  None   Consultations:  Surgery  Discharge Instructions  Discharge Orders   Future Orders Complete By Expires   Discontinue IV  As directed    Increase activity slowly  As directed        Medication List    STOP taking these medications       estrogens (conjugated) 0.3 MG tablet  Commonly known as:  PREMARIN     ibuprofen 200 MG tablet  Commonly known as:  ADVIL,MOTRIN      TAKE these medications       Biotin 5000 MCG Tabs  Take 1 tablet by mouth daily.     gabapentin 100 MG capsule  Commonly known as:  NEURONTIN  Take 1 capsule (100 mg total) by mouth 3 (  three) times daily.     HYDROcodone-acetaminophen 5-325 MG per tablet  Commonly known as:  NORCO/VICODIN  Take 1-2 tablets by mouth every 4 (four) hours as needed for moderate pain.     ketoconazole 2 % cream  Commonly known as:  NIZORAL  Apply 1 application topically daily.     Vitamin D 2000 UNITS Caps  Take 1 capsule by mouth daily.       Allergies  Allergen Reactions  . Codeine Nausea Only       Follow-up Information   Follow up with  Harlow AsaLUKING,W S, MD. Schedule an appointment as soon as possible for a visit in 2 weeks.   Specialty:  Family Medicine   Contact information:   [address] [place] [address] [phone]        The results of significant diagnostics from this hospitalization (including imaging, microbiology, ancillary and laboratory) are listed below for reference.    Significant Diagnostic Studies: Dg Chest 1 View  07/09/2013   CLINICAL DATA:  Dyspnea and nausea.  EXAM: CHEST - 1 VIEW  COMPARISON:  Chest x-ray 05/08/2013.  FINDINGS: Lung volumes are low. No consolidative airspace disease. No pleural effusions. No pneumothorax. No pulmonary nodule or mass noted. Pulmonary vasculature and the cardiomediastinal silhouette are within normal limits. Orthopedic fixation hardware in the cervical spine incidentally noted.  IMPRESSION: Low lung volumes without radiographic evidence of acute cardiopulmonary disease.   Electronically Signed   By: Trudie Reedaniel  Entrikin M.D.   On: 07/09/2013 18:27   Ct Angio Chest Pe W/cm &/or Wo Cm  07/12/2013   CLINICAL DATA:  Congestive heart failure.  Shortness of breath.  EXAM: CT ANGIOGRAPHY CHEST WITH CONTRAST  TECHNIQUE: Multidetector CT imaging of the chest was performed using the standard protocol during bolus administration of intravenous contrast. Multiplanar CT image reconstructions and MIPs were obtained to evaluate the vascular anatomy.  CONTRAST:  100mL OMNIPAQUE IOHEXOL 350 MG/ML SOLN  COMPARISON:  07/11/2013  FINDINGS: No filling defect is identified in the pulmonary arterial tree to suggest pulmonary embolus. No acute aortic abnormality is identified.  Small to moderate left and small right pleural effusions with passive atelectasis.  Right hilar node 1.1 cm in short axis. Left hilar lymph node 0.7 cm in short axis.  Mild stranding above the pancreas could reflect pancreatitis but is only partially included on today's exam.  Review of the MIP images confirms the above  findings.  IMPRESSION: 1. Bilateral pleural effusions with passive atelectasis. 2. No embolus or aortic dissection identified. 3. Stranding in the adipose tissue but the pancreas could be a secondary sign of pancreatitis -correlate with clinical history. 4. Mildly enlarged right hilar lymph node, nonspecific, with differential diagnostic considerations favoring benign etiology such as reactive lymph node or passive congestion over malignancy.   Electronically Signed   By: Herbie BaltimoreWalt  Liebkemann M.D.   On: 07/12/2013 16:09   Ct Abdomen Pelvis W Contrast  07/09/2013   CLINICAL DATA:  Upper abdominal pain. Recent cholecystectomy 05/15/2013.  EXAM: CT ABDOMEN AND PELVIS WITH CONTRAST  TECHNIQUE: Multidetector CT imaging of the abdomen and pelvis was performed using the standard protocol following bolus administration of intravenous contrast.  CONTRAST:  50mL OMNIPAQUE IOHEXOL 300 MG/ML SOLN, 100mL OMNIPAQUE IOHEXOL 300 MG/ML SOLN  COMPARISON:  Ultrasound abdomen 05/08/2013. Intraoperative cholangiogram 05/15/2013.  FINDINGS: Lung bases are clear except for minimal scarring/subsegmental atelectatic change. Trace fluid over the dome of the liver without focal intrahepatic defects. Normal spleen. No renal obstruction. Simple  4 cm left renal cyst projects laterally from midpole.  In the gallbladder fossa there is considerable low attenuation fluid, abnormal given the length of time since surgery. Surgical clips are seen in the region of the cystic duct. There is no biliary ductal dilatation within limits of detection. No visible CBD stone. No pancreatic duct dilatation.  There is mild edema surrounding the head of the pancreas and fluid tracking posterior to the body and tail of the pancreas along the anterior margin of Gerota's fascia to the left. Considerations include a small bile leak, pancreatitis, or less likely abscess. ERCP could be helpful in further evaluation to evaluate for a small retained stone or possibly a bile  leak. Alternately a hepatobiliary scan could assess for abnormal accumulation of radiotracer outside the biliary tree.  No appendiceal inflammation. Mild stool burden. No pelvic masses. Lumbar degenerative disc disease. No worrisome osseous lesions. Tiny fat containing umbilical hernia. No adenopathy.  IMPRESSION: Abnormal fluid accumulation in the gallbladder fossa, around the head of the pancreas, and tracking leftward along the body and tail of the pancreas. Pancreatitis versus bile leak are the more likely considerations. Postoperative abscess is less favored. Correlate clinically.   Electronically Signed   By: Davonna BellingJohn  Curnes M.D.   On: 07/09/2013 08:20   Dg Chest Port 1 View  07/11/2013   CLINICAL DATA:  Shortness of breath  EXAM: PORTABLE CHEST - 1 VIEW  COMPARISON:  DG CHEST 1 VIEW dated 07/09/2013  FINDINGS: Shallow inspiration. Heart size and pulmonary vascularity are normal for inspiratory effort. Linear atelectasis in the lung bases. No focal consolidation. No blunting of costophrenic angles. No pneumothorax. Postoperative changes in the cervical spine.  IMPRESSION: Shallow inspiration.  No evidence of active pulmonary disease.   Electronically Signed   By: Burman NievesWilliam  Stevens M.D.   On: 07/11/2013 05:43   Dg Chest Port 1v Same Day  07/11/2013   CLINICAL DATA:  Dyspnea  EXAM: PORTABLE CHEST - 1 VIEW SAME DAY  COMPARISON:  DG CHEST 1V PORT dated 07/11/2013  FINDINGS: Low volumes. Bibasilar atelectasis. Normal heart size. No pneumothorax.  IMPRESSION: Stable bibasilar atelectasis.   Electronically Signed   By: Maryclare BeanArt  Hoss M.D.   On: 07/11/2013 23:51    Microbiology: Recent Results (from the past 240 hour(s))  URINE CULTURE     Status: None   Collection Time    07/09/13  3:14 PM      Result Value Ref Range Status   Specimen Description URINE, CLEAN CATCH   Final   Special Requests NONE   Final   Culture  Setup Time     Final   Value: 07/09/2013 20:14     Performed at [facility]   Colony  Count     Final   Value: 15,000 COLONIES/ML     Performed at [facility]   Culture     Final   Value: Multiple bacterial morphotypes present, none predominant. Suggest appropriate recollection if clinically indicated.     Performed at [facility]   Report Status 07/10/2013 FINAL   Final  CULTURE, BLOOD (ROUTINE X 2)     Status: None   Collection Time    07/10/13  7:25 AM      Result Value Ref Range Status   Specimen Description BLOOD LEFT ARM   Final   Special Requests     Final   Value: BOTTLES DRAWN AEROBIC AND ANAEROBIC 8CC EACH BOTTLE   Culture NO GROWTH 5 DAYS  Final   Report Status 07/15/2013 FINAL   Final  CULTURE, BLOOD (ROUTINE X 2)     Status: None   Collection Time    07/10/13  7:25 AM      Result Value Ref Range Status   Specimen Description BLOOD LEFT ARM   Final   Special Requests     Final   Value: BOTTLES DRAWN AEROBIC AND ANAEROBIC 8CC EACH BOTTLE   Culture NO GROWTH 5 DAYS   Final   Report Status 07/15/2013 FINAL   Final  CLOSTRIDIUM DIFFICILE BY PCR     Status: None   Collection Time    07/11/13  7:00 PM      Result Value Ref Range Status   C difficile by pcr NEGATIVE  NEGATIVE Final  CLOSTRIDIUM DIFFICILE BY PCR     Status: None   Collection Time    07/13/13  2:25 PM      Result Value Ref Range Status   C difficile by pcr NEGATIVE  NEGATIVE Final     Labs: Basic Metabolic Panel:  Recent Labs Lab 07/11/13 0556 07/12/13 0550 07/13/13 0607 07/14/13 0614 07/15/13 0554  NA 138 137 136* 135* 139  K 4.0 3.0* 3.5* 3.4* 3.9  CL 101 96 96 94* 96  CO2 24 27 28 30 30   GLUCOSE 112* 116* 111* 108* 132*  BUN 6 5* 5* 6 6  CREATININE 0.56 0.51 0.56 0.48* 0.41*  CALCIUM 8.3* 8.3* 8.4 8.5 8.9  MG  --  2.0  --   --   --    Liver Function Tests:  Recent Labs Lab 07/09/13 0557 07/10/13 0535 07/11/13 0556 07/11/13 1924  AST 15 15 19 20   ALT 13 9 12 12   ALKPHOS 87 74 92 94  BILITOT 0.2* 0.3 0.5 0.5  PROT 8.0 7.0 7.0 7.0  ALBUMIN 3.7  3.1* 2.9* 3.0*    Recent Labs Lab 07/09/13 0557 07/12/13 0550  LIPASE >3000* 19   No results found for this basename: AMMONIA,  in the last 168 hours CBC:  Recent Labs Lab 07/09/13 0557  07/11/13 0556 07/12/13 0550 07/13/13 0607 07/14/13 0614 07/15/13 0554  WBC 12.8*  < > 20.7* 18.9* 15.8* 16.8* 16.0*  NEUTROABS 9.6*  --   --   --   --   --   --   HGB 14.0  < > 11.8* 11.3* 10.7* 10.8* 11.4*  HCT 43.5  < > 36.8 34.4* 32.5* 33.3* 34.4*  MCV 89.3  < > 90.2 88.4 88.1 88.1 86.4  PLT 342  < > 287 258 273 282 361  < > = values in this interval not displayed. Cardiac Enzymes:  Recent Labs Lab 07/09/13 0557  TROPONINI <0.30   BNP: BNP (last 3 results)  Recent Labs  07/11/13 0556  PROBNP 709.3*   CBG: No results found for this basename: GLUCAP,  in the last 168 hours     Signed:  Henderson Cloud  [facility] Pager: [phone] 07/15/2013, 11:32 AM

## 2013-07-15 NOTE — Progress Notes (Signed)
Patient discharged home.  IV removed - WNL.  Educated on diet to prevent further pancreatitis.  Patient verbalizes understanding.  Instructed on new prescription.  No questions at this time.  Stable to DC - left floor via WC with RN assist.

## 2013-07-18 ENCOUNTER — Encounter: Payer: Self-pay | Admitting: Family Medicine

## 2013-07-18 ENCOUNTER — Ambulatory Visit (INDEPENDENT_AMBULATORY_CARE_PROVIDER_SITE_OTHER): Payer: Medicare Other | Admitting: Family Medicine

## 2013-07-18 VITALS — BP 138/82 | Ht 60.5 in | Wt 193.4 lb

## 2013-07-18 DIAGNOSIS — K859 Acute pancreatitis without necrosis or infection, unspecified: Secondary | ICD-10-CM

## 2013-07-18 DIAGNOSIS — J9600 Acute respiratory failure, unspecified whether with hypoxia or hypercapnia: Secondary | ICD-10-CM

## 2013-07-18 DIAGNOSIS — I10 Essential (primary) hypertension: Secondary | ICD-10-CM

## 2013-07-18 NOTE — Progress Notes (Signed)
   Subjective:    Patient ID: Vertell LimberRuth S Round, female    DOB: 02/04/1941, 73 y.o.   MRN: [id number]  HPI Patient arrives for follow up from the hospital for pancreatitis.  Patient states that she has bad sores in her mouth and her mouth is very sore and she is not able to eat. Patient also wants to discuss hospitalization and concerns related to treatment and diagnosis.  Patient was initially advised her pancreatitis is likely secondary to Premarin. She has been on Premarin for quite some time.  Of note patient cholecystectomy earlier in the year. Gen. surgery was consult. Patient was found to have no evidence of persistent stone or or common bile duct etiology for pancreatitis.  Hospitalization was complicated by a spell of congestive heart failure. On further analysis of the chart this was do to ample fluids. Echocardiogram revealed excellent ejection fraction.  Patient notes soreness and now. Inflammation and tenderness. Did take a lot of antibiotics.  Patient notes fatigue diminished energy appetite is slowly improving.   Review of Systems No headache no chest pain no back pain no active vomiting no diarrhea upper abdominal middle pain improving    Objective:   Physical Exam  Alert talkative mild malaise HEENT oropharyngeal erythema glossitis neck supple lungs clear heart rare rhythm. Epigastrium some tenderness. Feet without edema  impression 1 status post pancreatitis discussed. #2 status post fluid excess with CHF equivalent discussed. Did use that this is ejection fraction. #3 potential etiology unclear though temporal association with cholecystectomy still interesting. #4 substantial elevation white blood count. However was near normal before initiation. #5 glossitis likely secondary to  antibiotics plan massive mouthwash. Warning signs discussed. Gradual hydration. Gradual advancement of diet. If another bout of pancreatitis will need a full GI workup. Followup as scheduled. Easily 40  minutes spent most in discussion. WSL      Assessment & Plan:  See above

## 2013-07-19 ENCOUNTER — Telehealth: Payer: Self-pay | Admitting: Family Medicine

## 2013-07-19 MED ORDER — FLUCONAZOLE 150 MG PO TABS: ORAL_TABLET | ORAL | Status: DC

## 2013-07-19 NOTE — Telephone Encounter (Signed)
Rx sent electronically to pharmacy. Patient notified. 

## 2013-07-19 NOTE — Telephone Encounter (Signed)
difl 150 numb two one po three d apart 

## 2013-07-19 NOTE — Telephone Encounter (Signed)
Pt states she has a yeast infection from all the meds she has been on From her stay [facility]  Wants to know if we can call something in for her? Either Difulcan or the cream Which ever you think is best for her.   [facility]

## 2013-07-28 ENCOUNTER — Encounter: Payer: Self-pay | Admitting: Internal Medicine

## 2013-08-02 ENCOUNTER — Ambulatory Visit (INDEPENDENT_AMBULATORY_CARE_PROVIDER_SITE_OTHER): Payer: Medicare Other | Admitting: Family Medicine

## 2013-08-02 ENCOUNTER — Encounter: Payer: Self-pay | Admitting: Family Medicine

## 2013-08-02 VITALS — BP 138/74 | Temp 98.5°F | Ht 61.0 in | Wt 191.6 lb

## 2013-08-02 DIAGNOSIS — R109 Unspecified abdominal pain: Secondary | ICD-10-CM

## 2013-08-02 LAB — CBC WITH DIFFERENTIAL/PLATELET
BASOS ABS: 0 10*3/uL (ref 0.0–0.1)
BASOS PCT: 0 % (ref 0–1)
Eosinophils Absolute: 0.2 10*3/uL (ref 0.0–0.7)
Eosinophils Relative: 2 % (ref 0–5)
HCT: 37.8 % (ref 36.0–46.0)
HEMOGLOBIN: 12.3 g/dL (ref 12.0–15.0)
Lymphocytes Relative: 30 % (ref 12–46)
Lymphs Abs: 2.4 10*3/uL (ref 0.7–4.0)
MCH: 28.5 pg (ref 26.0–34.0)
MCHC: 32.5 g/dL (ref 30.0–36.0)
MCV: 87.7 fL (ref 78.0–100.0)
MONOS PCT: 7 % (ref 3–12)
Monocytes Absolute: 0.6 10*3/uL (ref 0.1–1.0)
NEUTROS ABS: 4.9 10*3/uL (ref 1.7–7.7)
NEUTROS PCT: 61 % (ref 43–77)
PLATELETS: 390 10*3/uL (ref 150–400)
RBC: 4.31 MIL/uL (ref 3.87–5.11)
RDW: 15 % (ref 11.5–15.5)
WBC: 8.1 10*3/uL (ref 4.0–10.5)

## 2013-08-02 LAB — BASIC METABOLIC PANEL
BUN: 17 mg/dL (ref 6–23)
CALCIUM: 9.9 mg/dL (ref 8.4–10.5)
CO2: 31 mEq/L (ref 19–32)
Chloride: 102 mEq/L (ref 96–112)
Creat: 0.68 mg/dL (ref 0.50–1.10)
GLUCOSE: 98 mg/dL (ref 70–99)
Potassium: 4.9 mEq/L (ref 3.5–5.3)
SODIUM: 141 meq/L (ref 135–145)

## 2013-08-02 LAB — LIPASE: LIPASE: 39 U/L (ref 11–59)

## 2013-08-02 LAB — HEPATIC FUNCTION PANEL
ALBUMIN: 3.4 g/dL — AB (ref 3.5–5.2)
ALK PHOS: 84 U/L (ref 39–117)
ALT: 19 U/L (ref 0–35)
AST: 18 U/L (ref 0–37)
Bilirubin, Direct: <0.1 mg/dL (ref 0.0–0.3)
Indirect Bilirubin: 0.2 mg/dL (ref 0.2–1.2)
TOTAL PROTEIN: 7.2 g/dL (ref 6.0–8.3)
Total Bilirubin: 0.2 mg/dL — ABNORMAL LOW (ref 0.3–1.2)

## 2013-08-02 LAB — AMYLASE: Amylase: 38 U/L (ref 0–105)

## 2013-08-02 NOTE — Progress Notes (Signed)
   Subjective:    Patient ID: Kerry Richardson, female    DOB: [birth year], 73 y.o.   MRN: [id number]  HPIFollow up from hospital stay for pancreatitis. Abdomen is still tender. patient is still experiencing residual discomfort in her stomach. Notes that it is not nearly as bad as when she was in the hospital with acute pancreatitis. No major reflux symptoms.  Left side pain. Pt thinks she pulled muscle in the shower today.   Recheck thrush in mouth. Tongue still feels a little irritated and inflamed at times.   Bowels normal.  Review of Systems No chest pain no headache no loss of weight no fever no chills appetite improving ROS otherwise negative.    Objective:   Physical Exam  Alert no apparent distress talkative HEENT tongue slight erythema pharynx normal lungs clear heart rare rhythm no CVA tenderness spine nontender. Abdomen some epigastric tenderness no rebound no guarding excellent bowel sounds.      Assessment & Plan:  Impression #1 post admission for pancreatitis. Patient returns with ongoing difficulties. Some abdominal discomfort. In fact she has gone on and scheduled a visit with a gastroenterologist. #2 glossitis potential element of thrush it's so mild plan recommend urgent blood work. If enzymes elevated will repeat scan and abdomen. Addendum blood work returned excellent white blood count normal. Pancreas enzymes normal. Liver enzymes normal. Based on this recommend adding protonic 40 daily. Patient encouraged to go on and see the gastroenterologist. I expect gradual improvement. WSL

## 2013-08-03 ENCOUNTER — Other Ambulatory Visit: Payer: Self-pay | Admitting: *Deleted

## 2013-08-03 ENCOUNTER — Telehealth: Payer: Self-pay | Admitting: *Deleted

## 2013-08-03 MED ORDER — PANTOPRAZOLE SODIUM 40 MG PO TBEC: 40.0000 mg | DELAYED_RELEASE_TABLET | Freq: Every day | ORAL | Status: DC

## 2013-08-03 NOTE — Telephone Encounter (Signed)
Discussed with pt her bloodwork results per Dr. Brett CanalesSteve. Pancreatic enzymes normal, WBC normal, Liver enzymes normal. No further scans recommended at this time. If pt is still having epi gastric discomfort take protonix 40mg  #30 one qam with 2 additional refills. Keep appt next week with [facility]. Records will be faxed and a copy up front for the pt to pick up to take to appt. [facility]

## 2013-08-09 ENCOUNTER — Other Ambulatory Visit: Payer: Self-pay | Admitting: Gastroenterology

## 2013-08-09 DIAGNOSIS — K859 Acute pancreatitis without necrosis or infection, unspecified: Secondary | ICD-10-CM

## 2013-08-18 ENCOUNTER — Ambulatory Visit
Admission: RE | Admit: 2013-08-18 | Discharge: 2013-08-18 | Disposition: A | Discharge status: Home / Self Care | Payer: Medicare Other | Source: Ambulatory Visit | Attending: Gastroenterology | Admitting: Gastroenterology

## 2013-08-18 DIAGNOSIS — K859 Acute pancreatitis without necrosis or infection, unspecified: Secondary | ICD-10-CM

## 2013-08-18 MED ORDER — GADOBENATE DIMEGLUMINE 529 MG/ML IV SOLN
18.0000 mL | Freq: Once | INTRAVENOUS | Status: AC | PRN
  Administered 2013-08-18: 18 mL via INTRAVENOUS

## 2013-09-25 ENCOUNTER — Ambulatory Visit: Payer: Medicare Other | Admitting: Internal Medicine

## 2013-11-09 ENCOUNTER — Encounter: Payer: Self-pay | Admitting: Family Medicine

## 2013-11-09 ENCOUNTER — Ambulatory Visit (INDEPENDENT_AMBULATORY_CARE_PROVIDER_SITE_OTHER): Payer: Medicare Other | Admitting: Family Medicine

## 2013-11-09 VITALS — BP 134/78 | Ht 60.5 in | Wt 195.5 lb

## 2013-11-09 DIAGNOSIS — Z0000 Encounter for general adult medical examination without abnormal findings: Secondary | ICD-10-CM

## 2013-11-09 MED ORDER — ESTROGENS CONJUGATED 0.3 MG PO TABS: 0.3000 mg | ORAL_TABLET | Freq: Every day | ORAL | Status: DC

## 2013-11-09 NOTE — Progress Notes (Signed)
   Subjective:    Patient ID: Kerry LimberRuth S Sirmon, female    DOB: 12/07/1940, 73 y.o.   MRN: [id number]  HPI AWV- Annual Wellness Visit  The patient was seen for their annual wellness visit. The patient's past medical history, surgical history, and family history were reviewed. Pertinent vaccines were reviewed ( tetanus, pneumonia, shingles, flu) The patient's medication list was reviewed and updated.  The height and weight were entered. The patient's current BMI is: 37.55   Cognitive screening was completed. Outcome of Mini - Cog: passed  Falls within the past 6 months:none  Current tobacco usage: non-smoker (All patients who use tobacco were given written and verbal information on quitting)  Recent listing of emergency department/hospitalizations over the past year were reviewed.  current specialist the patient sees on a regular basis: Dr. Romeo AppleHarrison   Medicare annual wellness visit patient questionnaire was reviewed.  A written screening schedule for the patient for the next 5-10 years was given. Appropriate discussion of followup regarding next visit was discussed.  Patient needs a refill on her premarin. Patient states that she has no other concerns at this time.   Unable to do reg exrcise unfortunately, pinched nerve causing challenges. Multi chiro visits has not helped  Sp pancreatitis and doing better, hopefully less likely to get another attack    Review of Systems  Constitutional: Negative for activity change, appetite change and fatigue.  HENT: Negative for congestion, ear discharge and rhinorrhea.   Eyes: Negative for discharge.  Respiratory: Negative for cough, chest tightness and wheezing.   Cardiovascular: Negative for chest pain.  Gastrointestinal: Negative for vomiting and abdominal pain.  Genitourinary: Negative for frequency and difficulty urinating.  Musculoskeletal: Negative for neck pain.  Allergic/Immunologic: Negative for environmental allergies and food  allergies.  Neurological: Negative for weakness and headaches.  Psychiatric/Behavioral: Negative for behavioral problems and agitation.  All other systems reviewed and are negative.      Objective:   Physical Exam  Vitals reviewed. Constitutional: She is oriented to person, place, and time. She appears well-developed and well-nourished.  HENT:  Head: Normocephalic.  Right Ear: External ear normal.  Left Ear: External ear normal.  Eyes: Pupils are equal, round, and reactive to light.  Neck: Normal range of motion. No thyromegaly present.  Cardiovascular: Normal rate, regular rhythm, normal heart sounds and intact distal pulses.   No murmur heard. Pulmonary/Chest: Effort normal and breath sounds normal. No respiratory distress. She has no wheezes.  Abdominal: Soft. Bowel sounds are normal. She exhibits no distension and no mass. There is no tenderness.  Musculoskeletal: Normal range of motion. She exhibits no edema and no tenderness.  Lymphadenopathy:    She has no cervical adenopathy.  Neurological: She is alert and oriented to person, place, and time. She exhibits normal muscle tone.  Skin: Skin is warm and dry.  Psychiatric: She has a normal mood and affect. Her behavior is normal.          Assessment & Plan:  Impression #1 wellness exam #2 preventive interventions patient adamantly declines mammogram and colonoscopy. In fact has never had a colonoscopy. #3 chronic Premarin usage absolutely needs it and is willing to realize increased risk of mortality on this medicine. Plan meds refilled per patient request. Diet exercise discussed. Vaccinations discussed. Preventative measures encouraged and declined. WSL

## 2013-11-16 LAB — POC HEMOCCULT BLD/STL (HOME/3-CARD/SCREEN)
Card #3 Fecal Occult Blood, POC: NEGATIVE
FECAL OCCULT BLD: NEGATIVE
FECAL OCCULT BLD: NEGATIVE

## 2013-12-14 ENCOUNTER — Encounter (HOSPITAL_COMMUNITY): Payer: Self-pay | Admitting: Emergency Medicine

## 2013-12-14 ENCOUNTER — Emergency Department (HOSPITAL_COMMUNITY)
Admission: EM | Admit: 2013-12-14 | Discharge: 2013-12-14 | Disposition: A | Discharge status: Home / Self Care | Payer: Medicare Other | Attending: Emergency Medicine | Admitting: Emergency Medicine

## 2013-12-14 DIAGNOSIS — I10 Essential (primary) hypertension: Secondary | ICD-10-CM | POA: Diagnosis not present

## 2013-12-14 DIAGNOSIS — R0602 Shortness of breath: Secondary | ICD-10-CM | POA: Insufficient documentation

## 2013-12-14 DIAGNOSIS — R498 Other voice and resonance disorders: Secondary | ICD-10-CM | POA: Diagnosis not present

## 2013-12-14 DIAGNOSIS — T783XXA Angioneurotic edema, initial encounter: Secondary | ICD-10-CM | POA: Insufficient documentation

## 2013-12-14 DIAGNOSIS — Z79899 Other long term (current) drug therapy: Secondary | ICD-10-CM | POA: Diagnosis not present

## 2013-12-14 LAB — CBC WITH DIFFERENTIAL/PLATELET
BASOS ABS: 0 10*3/uL (ref 0.0–0.1)
Basophils Relative: 0 % (ref 0–1)
EOS ABS: 0.2 10*3/uL (ref 0.0–0.7)
Eosinophils Relative: 2 % (ref 0–5)
HCT: 39 % (ref 36.0–46.0)
Hemoglobin: 13.2 g/dL (ref 12.0–15.0)
Lymphocytes Relative: 39 % (ref 12–46)
Lymphs Abs: 3.8 10*3/uL (ref 0.7–4.0)
MCH: 29.8 pg (ref 26.0–34.0)
MCHC: 33.8 g/dL (ref 30.0–36.0)
MCV: 88 fL (ref 78.0–100.0)
Monocytes Absolute: 0.5 10*3/uL (ref 0.1–1.0)
Monocytes Relative: 5 % (ref 3–12)
NEUTROS PCT: 54 % (ref 43–77)
Neutro Abs: 5.4 10*3/uL (ref 1.7–7.7)
PLATELETS: 321 10*3/uL (ref 150–400)
RBC: 4.43 MIL/uL (ref 3.87–5.11)
RDW: 13.8 % (ref 11.5–15.5)
WBC: 9.8 10*3/uL (ref 4.0–10.5)

## 2013-12-14 LAB — BASIC METABOLIC PANEL
Anion gap: 15 (ref 5–15)
BUN: 21 mg/dL (ref 6–23)
CO2: 26 mEq/L (ref 19–32)
Calcium: 9.2 mg/dL (ref 8.4–10.5)
Chloride: 98 mEq/L (ref 96–112)
Creatinine, Ser: 0.67 mg/dL (ref 0.50–1.10)
GFR calc non Af Amer: 84 mL/min — ABNORMAL LOW (ref >90)
Glucose, Bld: 131 mg/dL — ABNORMAL HIGH (ref 70–99)
Potassium: 3.7 mEq/L (ref 3.7–5.3)
SODIUM: 139 meq/L (ref 137–147)

## 2013-12-14 MED ORDER — FAMOTIDINE IN NACL 20-0.9 MG/50ML-% IV SOLN
20.0000 mg | Freq: Once | INTRAVENOUS | Status: AC
  Administered 2013-12-14: 20 mg via INTRAVENOUS
  Filled 2013-12-14: qty 50

## 2013-12-14 MED ORDER — DEXAMETHASONE 4 MG PO TABS: 4.0000 mg | ORAL_TABLET | Freq: Two times a day (BID) | ORAL | Status: DC

## 2013-12-14 MED ORDER — METHYLPREDNISOLONE SODIUM SUCC 125 MG IJ SOLR
125.0000 mg | Freq: Once | INTRAMUSCULAR | Status: AC
  Administered 2013-12-14: 125 mg via INTRAVENOUS
  Filled 2013-12-14: qty 2

## 2013-12-14 MED ORDER — DIPHENHYDRAMINE HCL 50 MG/ML IJ SOLN
25.0000 mg | Freq: Once | INTRAMUSCULAR | Status: AC
  Administered 2013-12-14: 25 mg via INTRAVENOUS
  Filled 2013-12-14: qty 1

## 2013-12-14 NOTE — ED Provider Notes (Signed)
CSN: [id number]     Arrival date & time 12/14/13  0002 History   First MD Initiated Contact with Patient 12/14/13 0008     Chief Complaint  Patient presents with  . Shortness of Breath     (Consider location/radiation/quality/duration/timing/severity/associated sxs/prior Treatment) Patient is a 73 y.o. female presenting with shortness of breath. The history is provided by the patient.  Shortness of Breath Associated symptoms: no abdominal pain, no chest pain, no headaches, no rash and no vomiting    patient presents with shortness of breath and swelling of her tongue and in her mouth. Began at around 11:00, 1 hour prior to arrival. No pain. She's not had episodes like this before. No chest pain. No difficulty swallowing. No fevers. No new food exposures. No new medicines. She is not on lisinopril. No trauma. She states it started with her hands itching first. Past Medical History  Diagnosis Date  . Bulging discs   . Bursitis   . Arthritis   . H/O removal of cyst     finger (uncertain of which finger)  . Migraine headache   . Allergy     chronic  . Reflux   . Back pain   . DDD (degenerative disc disease)   . Neck pain   . GERD (gastroesophageal reflux disease)   . Glaucoma   . PONV (postoperative nausea and vomiting)    Past Surgical History  Procedure Laterality Date  . Cervical fusion    . Abdominal hysterectomy    . Foot surgery    . Carpal tunnel release      bilateral  . Knee arthroscopy with medial menisectomy  03/14/2012    Procedure: KNEE ARTHROSCOPY WITH MEDIAL MENISECTOMY;  Surgeon: Vickki Hearing, MD;  Location: [facility];  Service: Orthopedics;  Laterality: Left;  . Chondroplasty  03/14/2012    Procedure: CHONDROPLASTY;  Surgeon: Vickki Hearing, MD;  Location: [facility];  Service: Orthopedics;  Laterality: Left;  medial condyle  . Chondroplasty Right 12/23/2012    Procedure: KNEE ARTHROSCOPY WITH CHONDROPLASTY WITH LIMITED DEBRIDMENT;  Surgeon: Vickki Hearing, MD;  Location: [facility];  Service: Orthopedics;  Laterality: Right;  . Cholecystectomy N/A 05/15/2013    Procedure: LAPAROSCOPIC CHOLECYSTECTOMY WITH INTRAOPERATIVE CHOLANGIOGRAM;  Surgeon: Dalia Heading, MD;  Location: [facility];  Service: General;  Laterality: N/A;   Family History  Problem Relation Age of Onset  . Hypertension Mother     borderline   History  Substance Use Topics  . Smoking status: Never Smoker   . Smokeless tobacco: Not on file  . Alcohol Use: No   OB History   Grav Para Term Preterm Abortions TAB SAB Ect Mult Living                 Review of Systems  Constitutional: Negative for activity change and appetite change.  HENT: Positive for voice change. Negative for congestion, facial swelling, hearing loss, mouth sores and trouble swallowing.   Eyes: Negative for pain.  Respiratory: Positive for shortness of breath. Negative for chest tightness.   Cardiovascular: Negative for chest pain and leg swelling.  Gastrointestinal: Negative for nausea, vomiting, abdominal pain and diarrhea.  Genitourinary: Negative for flank pain.  Musculoskeletal: Negative for back pain and neck stiffness.  Skin: Negative for rash.  Neurological: Negative for weakness, numbness and headaches.  Psychiatric/Behavioral: Negative for behavioral problems.      Allergies  Codeine  Home Medications   Prior to Admission medications   Medication  Sig Start Date End Date Taking? Authorizing Provider  Biotin 5000 MCG TABS Take 1 tablet by mouth daily.   Yes Historical Provider, MD  Cholecalciferol (VITAMIN D) 2000 UNITS CAPS Take 1 capsule by mouth daily.   Yes Historical Provider, MD  estrogens, conjugated, (PREMARIN) 0.3 MG tablet Take 1 tablet (0.3 mg total) by mouth daily. 11/09/13  Yes Merlyn Albert, MD  gabapentin (NEURONTIN) 100 MG capsule Take 1 capsule (100 mg total) by mouth 3 (three) times daily. 07/04/13  Yes Vickki Hearing, MD  ketoconazole (NIZORAL) 2 % cream Apply  1 application topically daily.   Yes Historical Provider, MD  dexamethasone (DECADRON) 4 MG tablet Take 1 tablet (4 mg total) by mouth 2 (two) times daily. 12/14/13   Juliet Rude. Jashanti Clinkscale, MD   BP 164/91  Pulse 82  Temp(Src) 97.5 F (36.4 C) (Oral)  Resp 20  Ht 5' 0.5" (1.537 m)  Wt 185 lb (83.915 kg)  BMI 35.52 kg/m2  SpO2 96% Physical Exam  Constitutional: She appears well-developed.  HENT:  Head: Atraumatic.  Edema of the tongue. Edema below the tongue. Some swelling of floor of mouth. Mild posterior pharyngeal swelling. No erythema. No exudate.  Cardiovascular: Normal rate and regular rhythm.   Pulmonary/Chest: Effort normal and breath sounds normal.  Abdominal: Soft. There is no tenderness.  Musculoskeletal: Normal range of motion.  Neurological: She is alert.  Skin: Skin is warm.    ED Course  Procedures (including critical care time) Labs Review Labs Reviewed  BASIC METABOLIC PANEL - Abnormal; Notable for the following:    Glucose, Bld 131 (*)    GFR calc non Af Amer 84 (*)    All other components within normal limits  CBC WITH DIFFERENTIAL    Imaging Review No results found.   EKG Interpretation None      MDM   Final diagnoses:  Angioedema, initial encounter    Patient with angioedema. Began at 11 PM. There is swelling of the mouth and below the jaw, however there does not appear to be much difficulty breathing. Patient has remained stable to improving over a 5 hour observation. Will discharge home. No clear cause.    Juliet Rude. Rubin Payor, MD 12/15/13 [phone]

## 2013-12-14 NOTE — ED Notes (Signed)
Pt given ginger ale to drink. Pt states she feels as though the swelling has gone done and she can swallow better but "I still have a mouthful of tongue".

## 2013-12-14 NOTE — ED Notes (Signed)
Pt states she feels like she can swallow easier. Breathing and respiratory effort WDL.

## 2013-12-14 NOTE — ED Notes (Signed)
Patient c/o tongue swelling and shortness of breath that started an hour ago.  Patient states hands started itching first.

## 2013-12-14 NOTE — ED Notes (Signed)
MD at bedside. 

## 2013-12-14 NOTE — Discharge Instructions (Signed)
Angioedema °Angioedema is a sudden swelling of tissues, often of the skin. It can occur on the face or genitals or in the abdomen or other body parts. The swelling usually develops over a short period and gets better in 24 to 48 hours. It often begins during the night and is found when the person wakes up. The person may also get red, itchy patches of skin (hives). Angioedema can be dangerous if it involves swelling of the air passages.  °Depending on the cause, episodes of angioedema may only happen once, come back in unpredictable patterns, or repeat for several years and then gradually fade away.  °CAUSES  °Angioedema can be caused by an allergic reaction to various triggers. It can also result from nonallergic causes, including reactions to drugs, immune system disorders, viral infections, or an abnormal gene that is passed to you from your parents (hereditary). For some people with angioedema, the cause is unknown.  °Some things that can trigger angioedema include:  °· Foods.   °· Medicines, such as ACE inhibitors, ARBs, nonsteroidal anti-inflammatory agents, or estrogen.   °· Latex.   °· Animal saliva.   °· Insect stings.   °· Dyes used in X-rays.   °· Mild injury.   °· Dental work. °· Surgery. °· Stress.   °· Sudden changes in temperature.   °· Exercise. °SIGNS AND SYMPTOMS  °· Swelling of the skin. °· Hives. If these are present, there is also intense itching. °· Redness in the affected area.   °· Pain in the affected area. °· Swollen lips or tongue. °· Breathing problems. This may happen if the air passages swell. °· Wheezing. °If internal organs are involved, there may be:  °· Nausea.   °· Abdominal pain.   °· Vomiting.   °· Difficulty swallowing.   °· Difficulty passing urine. °DIAGNOSIS  °· Your health care provider will examine the affected area and take a medical and family history. °· Various tests may be done to help determine the cause. Tests may include: °¨ Allergy skin tests to see if the problem  is an allergic reaction.   °¨ Blood tests to check for hereditary angioedema.   °¨ Tests to check for underlying diseases that could cause the condition.   °· A review of your medicines, including over-the-counter medicines, may be done. °TREATMENT  °Treatment will depend on the cause of the angioedema. Possible treatments include:  °· Removal of anything that triggered the condition (such as stopping certain medicines).   °· Medicines to treat symptoms or prevent attacks. Medicines given may include:   °¨ Antihistamines.   °¨ Epinephrine injection.   °¨ Steroids.   °· Hospitalization may be required for severe attacks. If the air passages are affected, it can be an emergency. Tubes may need to be placed to keep the airway open. °HOME CARE INSTRUCTIONS  °· Take all medicines as directed by your health care provider. °· If you were given medicines for emergency allergy treatment, always carry them with you. °· Wear a medical bracelet as directed by your health care provider.   °· Avoid known triggers. °SEEK MEDICAL CARE IF:  °· You have repeat attacks of angioedema.   °· Your attacks are more frequent or more severe despite preventive measures.   °· You have hereditary angioedema and are considering having children. It is important to discuss with your health care provider the risks of passing the condition on to your children. °SEEK IMMEDIATE MEDICAL CARE IF:  °· You have severe swelling of the mouth, tongue, or lips. °· You have difficulty breathing.   °· You have difficulty swallowing.   °· You faint. °MAKE   SURE YOU: °· Understand these instructions. °· Will watch your condition. °· Will get help right away if you are not doing well or get worse. °Document Released: 05/18/2001 Document Revised: 07/24/2013 Document Reviewed: 10/31/2012 °Exit[employer]® Patient Information ©2015 [employer]. This information is not intended to replace advice given to you by your health care provider. Make sure you discuss any questions  you have with your health care provider. ° °

## 2013-12-18 ENCOUNTER — Ambulatory Visit (INDEPENDENT_AMBULATORY_CARE_PROVIDER_SITE_OTHER): Payer: Medicare Other | Admitting: Family Medicine

## 2013-12-18 ENCOUNTER — Encounter: Payer: Self-pay | Admitting: Family Medicine

## 2013-12-18 VITALS — BP 138/78 | Ht 61.0 in | Wt 197.0 lb

## 2013-12-18 DIAGNOSIS — T783XXA Angioneurotic edema, initial encounter: Secondary | ICD-10-CM

## 2013-12-18 DIAGNOSIS — Z23 Encounter for immunization: Secondary | ICD-10-CM

## 2013-12-18 DIAGNOSIS — T783XXD Angioneurotic edema, subsequent encounter: Secondary | ICD-10-CM

## 2013-12-18 DIAGNOSIS — Z5189 Encounter for other specified aftercare: Secondary | ICD-10-CM

## 2013-12-18 MED ORDER — EPINEPHRINE 0.3 MG/0.3ML IJ SOAJ: 0.3000 mg | Freq: Once | INTRAMUSCULAR | Status: DC

## 2013-12-18 NOTE — Progress Notes (Signed)
   Subjective:    Patient ID: Kerry Richardson, female    DOB: [birth year], 73 y.o.   MRN: [id number]  Urticaria  Patient following up from ER Visit.  Patient stated it started with itching of palms of hands  & her tongue had swollen cutting off her airway.   Patient  Would like to get a flu shot today.  Symptoms came out of the blue  Hands started itching suddenly  Has had hx off hands itching in the past, but never other symptoms  Pt then developed very sig swelling of tongue and throat, Went right to the er, No new foods   No new eds  Steroids for two days, sudden onset.  Day numb cell [phone],    Review of Systems No loss of consciousness no headache no wheezing no abdominal pain no vomiting ROS otherwise negative    Objective:   Physical Exam   Alert no apparent distress. Vitals stable. HEENT normal pharynx neck supple. No angioedema present today lungs clear. Heart regular in rhythm. Skin normal     Assessment & Plan:  Impression sudden urticarial rash with angioedema of soft tissues of mouth. Need to respect this significantly. Long discussion held plan EpiPen prescribed. Steroids Hardy given short-term. Allergy referral rationale discussed. 25 minutes spent most in discussion. WSL

## 2013-12-25 ENCOUNTER — Telehealth: Payer: Self-pay | Admitting: Family Medicine

## 2013-12-25 NOTE — Telephone Encounter (Signed)
Patient is requesting that the nurse give her a call back regarding an appointment that she was supposed to make for her.

## 2013-12-25 NOTE — Telephone Encounter (Signed)
The appointment is with Dr. Criss RosalesHicks-referral to allergy/asthma

## 2013-12-26 ENCOUNTER — Encounter: Payer: Self-pay | Admitting: Family Medicine

## 2014-02-06 ENCOUNTER — Other Ambulatory Visit: Payer: Self-pay | Admitting: Family Medicine

## 2014-05-03 ENCOUNTER — Telehealth: Payer: Self-pay | Admitting: Family Medicine

## 2014-05-03 NOTE — Telephone Encounter (Signed)
Pt's estrogens, conjugated, (PREMARIN) 0.3 MG tablet was DENIED, please see denial letter in green folder.  Please advise

## 2014-05-09 ENCOUNTER — Other Ambulatory Visit: Payer: Self-pay | Admitting: Family Medicine

## 2014-05-09 NOTE — Telephone Encounter (Signed)
Pt called to check on this, has taken last Premarin and needs to know if we are going to change to one of the preferred alternatives listed in the denial letter given to Dr. Brett CanalesSteve on 05/03/14, please advise and please notify pt  Cell# [phone]

## 2014-05-10 MED ORDER — ESTRADIOL ACETATE 0.45 MG PO TABS: 0.4500 mg | ORAL_TABLET | Freq: Every day | ORAL | Status: DC

## 2014-05-10 NOTE — Telephone Encounter (Signed)
Patient notified and verbalized understanding. 

## 2014-05-10 NOTE — Telephone Encounter (Signed)
Call pt, premarin denied swithc to .45 mg estradiol daily equals current dose of premarin, one yrs worth

## 2014-05-15 ENCOUNTER — Telehealth: Payer: Self-pay | Admitting: Family Medicine

## 2014-05-15 MED ORDER — ESTRADIOL ACETATE 0.45 MG PO TABS: 0.4500 mg | ORAL_TABLET | Freq: Every day | ORAL | Status: DC

## 2014-05-15 NOTE — Telephone Encounter (Signed)
Left message on voicemail notifying patient that medication has been sent to pharmacy again.

## 2014-05-15 NOTE — Telephone Encounter (Signed)
estradiol (FEMTRACE) 0.45 MG tablet     Please resend this [facility] is saying the still have NOT  Got it?   They say you should just call it in since it won't go through

## 2014-05-17 ENCOUNTER — Telehealth: Payer: Self-pay | Admitting: Family Medicine

## 2014-05-17 NOTE — Telephone Encounter (Signed)
estradiol (FEMTRACE) 0.45 MG tablet  This patient states they are not making this any longer That was need to send in a new script according to her  Insurance guidelines. I do not see any denial on the computer  Nor have a I seen one come across the faxes  [facility]

## 2014-05-17 NOTE — Telephone Encounter (Signed)
Notified patient that I talked with the pharmacist at [facility] and they stated that her estradiol [facility](Femtrace) is covered and they have the prescription and will go ahead and fill it. Pharmacist stated they are sorry for the mix up. Patient was notified.

## 2014-05-17 NOTE — Telephone Encounter (Signed)
Spoke with [facility] at [facility]. Estradiol 1mg  take 1/2 tablet. Do you want to send us to send this script in.

## 2014-05-17 NOTE — Telephone Encounter (Signed)
Call pharm and ask if they have estradiol in any avail strength and whit is avail (pharm should have done this rather than just tell the pt no)

## 2014-06-06 ENCOUNTER — Telehealth: Payer: Self-pay | Admitting: Family Medicine

## 2014-06-06 NOTE — Telephone Encounter (Signed)
Pt's estradiol (FEMTRACE) 0.45 MG tablet prior auth was APPROVED, expires 05/22/15 through her [employer]

## 2014-06-22 ENCOUNTER — Ambulatory Visit (INDEPENDENT_AMBULATORY_CARE_PROVIDER_SITE_OTHER): Payer: Medicare Other | Admitting: Family Medicine

## 2014-06-22 ENCOUNTER — Encounter: Payer: Self-pay | Admitting: Family Medicine

## 2014-06-22 VITALS — Temp 98.4°F | Ht 61.0 in | Wt 196.4 lb

## 2014-06-22 DIAGNOSIS — J209 Acute bronchitis, unspecified: Secondary | ICD-10-CM | POA: Diagnosis not present

## 2014-06-22 MED ORDER — AZITHROMYCIN 250 MG PO TABS: ORAL_TABLET | ORAL | Status: DC

## 2014-06-22 MED ORDER — BENZONATATE 100 MG PO CAPS: 100.0000 mg | ORAL_CAPSULE | Freq: Four times a day (QID) | ORAL | Status: DC | PRN

## 2014-06-22 MED ORDER — ALBUTEROL SULFATE HFA 108 (90 BASE) MCG/ACT IN AERS: 2.0000 | INHALATION_SPRAY | Freq: Four times a day (QID) | RESPIRATORY_TRACT | Status: DC | PRN

## 2014-06-22 NOTE — Progress Notes (Signed)
   Subjective:    Patient ID: Kerry Richardson, female    DOB: 09/23/1940, 74 y.o.   MRN: [id number]  Cough This is a new problem. The current episode started in the past 7 days. Associated symptoms include a fever, headaches, nasal congestion, a sore throat and wheezing. Treatments tried: sudafed.   Nose runny and stopped up and scratchy throat and got worse  Fever ongoing  Some prod cough phlegm  Rough cough thru out the night   Hx os bronchitis and wheezing with the inhaler, used the nebulizer   Review of Systems  Constitutional: Positive for fever.  HENT: Positive for sore throat.   Respiratory: Positive for cough and wheezing.   Neurological: Positive for headaches.       Objective:   Physical Exam  Alert moderate malaise. HEENT mild nasal congestion pharynx normal lungs faint wheeze bronchial cough heart regular in rhythm      Assessment & Plan:  Impression subacute bronchitis with reactive airways plan antibiotics prescribed. Albuterol 4 times a day. Local measures discussed WSL

## 2014-06-27 ENCOUNTER — Ambulatory Visit (INDEPENDENT_AMBULATORY_CARE_PROVIDER_SITE_OTHER): Payer: Medicare Other | Admitting: Family Medicine

## 2014-06-27 ENCOUNTER — Encounter: Payer: Self-pay | Admitting: Family Medicine

## 2014-06-27 VITALS — BP 122/74 | Temp 98.6°F | Ht 61.0 in | Wt 196.0 lb

## 2014-06-27 DIAGNOSIS — G5711 Meralgia paresthetica, right lower limb: Secondary | ICD-10-CM | POA: Diagnosis not present

## 2014-06-27 DIAGNOSIS — M792 Neuralgia and neuritis, unspecified: Secondary | ICD-10-CM | POA: Diagnosis not present

## 2014-06-27 NOTE — Patient Instructions (Signed)
Neuralgia or meralgia paraesthetica

## 2014-06-27 NOTE — Progress Notes (Signed)
   Subjective:    Patient ID: Kerry Richardson, female    DOB: 29-Apr-1940, 74 y.o.   MRN: [id number]   Cough The current episode started 1 to 4 weeks ago. Associated symptoms include wheezing. Treatments tried: zpack.   Follow up on right leg pain. No taking any meds for the pain.   Several months ago ,  Bothers pt maybr more at night  Feels like pins and needles sticking and burning pain really sharp and burning sensaio  No obv wekness in thre leg  No hip pain.   Positive history of sciatica feels different than that. Review of Systems  Respiratory: Positive for cough and wheezing.    cough overall better no fever no chills     Objective:   Physical Exam Alert vitals stable no apparent distress occasional cough HEENT normal lungs wheeze is now gone heart rare rhythm right leg strength intact reflexes intact anterolateral change in sensation along with dysesthesia       Assessment & Plan:  Impression meralgia paresthetica discussed at great length 25 minutes spent most in discussion plan appropriate blood work for neuropathy. Patient wishes to hold off on Neurontin etc. for now. Gen. nature of condition discussed. Patient does not wear tight fitting clothes at the belt line

## 2014-06-28 LAB — VITAMIN B12: VITAMIN B 12: 430 pg/mL (ref 211–946)

## 2014-06-28 LAB — TSH: TSH: 1.78 u[IU]/mL (ref 0.450–4.500)

## 2014-06-28 LAB — FOLATE: Folate: 9.4 ng/mL (ref >3.0)

## 2014-07-05 ENCOUNTER — Encounter: Payer: Self-pay | Admitting: Family Medicine

## 2014-09-17 ENCOUNTER — Other Ambulatory Visit: Payer: Self-pay

## 2014-09-26 NOTE — Patient Instructions (Signed)
Your procedure is scheduled on: 10/01/2014  Report to [facility] at  900  AM.  Call this number if you have problems the morning of surgery: [phone]   Do not eat food or drink liquids :After Midnight.      Take these medicines the morning of surgery with A SIP OF WATER: none   Do not wear jewelry, make-up or nail polish.  Do not wear lotions, powders, or perfumes.   Do not shave 48 hours prior to surgery.  Do not bring valuables to the hospital.  Contacts, dentures or bridgework may not be worn into surgery.  Leave suitcase in the car. After surgery it may be brought to your room.  For patients admitted to the hospital, checkout time is 11:00 AM the day of discharge.   Patients discharged the day of surgery will not be allowed to drive home.  :     Please read over the following fact sheets that you were given: Coughing and Deep Breathing, Surgical Site Infection Prevention, Anesthesia Post-op Instructions and Care and Recovery After Surgery    Cataract A cataract is a clouding of the lens of the eye. When a lens becomes cloudy, vision is reduced based on the degree and nature of the clouding. Many cataracts reduce vision to some degree. Some cataracts make people more near-sighted as they develop. Other cataracts increase glare. Cataracts that are ignored and become worse can sometimes look white. The white color can be seen through the pupil. CAUSES   Aging. However, cataracts may occur at any age, even in newborns.   Certain drugs.   Trauma to the eye.   Certain diseases such as diabetes.   Specific eye diseases such as chronic inflammation inside the eye or a sudden attack of a rare form of glaucoma.   Inherited or acquired medical problems.  SYMPTOMS   Gradual, progressive drop in vision in the affected eye.   Severe, rapid visual loss. This most often happens when trauma is the cause.  DIAGNOSIS  To detect a cataract, an eye doctor examines the lens. Cataracts are  best diagnosed with an exam of the eyes with the pupils enlarged (dilated) by drops.  TREATMENT  For an early cataract, vision may improve by using different eyeglasses or stronger lighting. If that does not help your vision, surgery is the only effective treatment. A cataract needs to be surgically removed when vision loss interferes with your everyday activities, such as driving, reading, or watching TV. A cataract may also have to be removed if it prevents examination or treatment of another eye problem. Surgery removes the cloudy lens and usually replaces it with a substitute lens (intraocular lens, IOL).  At a time when both you and your doctor agree, the cataract will be surgically removed. If you have cataracts in both eyes, only one is usually removed at a time. This allows the operated eye to heal and be out of danger from any possible problems after surgery (such as infection or poor wound healing). In rare cases, a cataract may be doing damage to your eye. In these cases, your caregiver may advise surgical removal right away. The vast majority of people who have cataract surgery have better vision afterward. HOME CARE INSTRUCTIONS  If you are not planning surgery, you may be asked to do the following:  Use different eyeglasses.   Use stronger or brighter lighting.   Ask your eye doctor about reducing your medicine dose or changing medicines if  it is thought that a medicine caused your cataract. Changing medicines does not make the cataract go away on its own.   Become familiar with your surroundings. Poor vision can lead to injury. Avoid bumping into things on the affected side. You are at a higher risk for tripping or falling.   Exercise extreme care when driving or operating machinery.   Wear sunglasses if you are sensitive to bright light or experiencing problems with glare.  SEEK IMMEDIATE MEDICAL CARE IF:   You have a worsening or sudden vision loss.   You notice redness,  swelling, or increasing pain in the eye.   You have a fever.  Document Released: 03/09/2005 Document Revised: 02/26/2011 Document Reviewed: 10/31/2010 [facility] Patient Information 2012 [place].PATIENT INSTRUCTIONS POST-ANESTHESIA  IMMEDIATELY FOLLOWING SURGERY:  Do not drive or operate machinery for the first twenty four hours after surgery.  Do not make any important decisions for twenty four hours after surgery or while taking narcotic pain medications or sedatives.  If you develop intractable nausea and vomiting or a severe headache please notify your doctor immediately.  FOLLOW-UP:  Please make an appointment with your surgeon as instructed. You do not need to follow up with anesthesia unless specifically instructed to do so.  WOUND CARE INSTRUCTIONS (if applicable):  Keep a dry clean dressing on the anesthesia/puncture wound site if there is drainage.  Once the wound has quit draining you may leave it open to air.  Generally you should leave the bandage intact for twenty four hours unless there is drainage.  If the epidural site drains for more than 36-48 hours please call the anesthesia department.  QUESTIONS?:  Please feel free to call your physician or the hospital operator if you have any questions, and they will be happy to assist you.

## 2014-09-27 ENCOUNTER — Encounter (HOSPITAL_COMMUNITY)
Admission: RE | Admit: 2014-09-27 | Discharge: 2014-09-27 | Disposition: A | Discharge status: Home / Self Care | Payer: Medicare Other | Source: Ambulatory Visit | Attending: Ophthalmology | Admitting: Ophthalmology

## 2014-09-27 ENCOUNTER — Other Ambulatory Visit: Payer: Self-pay

## 2014-09-27 ENCOUNTER — Encounter (HOSPITAL_COMMUNITY): Payer: Self-pay

## 2014-09-27 DIAGNOSIS — Z01818 Encounter for other preprocedural examination: Secondary | ICD-10-CM | POA: Diagnosis present

## 2014-09-27 DIAGNOSIS — H2512 Age-related nuclear cataract, left eye: Secondary | ICD-10-CM | POA: Diagnosis not present

## 2014-09-27 HISTORY — DX: Other disorders of plasma-protein metabolism, not elsewhere classified: E88.09

## 2014-09-27 HISTORY — DX: Lipid storage disorder, unspecified: E75.6

## 2014-09-27 HISTORY — DX: Other seasonal allergic rhinitis: J30.2

## 2014-09-27 LAB — BASIC METABOLIC PANEL
Anion gap: 7 (ref 5–15)
BUN: 15 mg/dL (ref 6–20)
CHLORIDE: 103 mmol/L (ref 101–111)
CO2: 28 mmol/L (ref 22–32)
CREATININE: 0.63 mg/dL (ref 0.44–1.00)
Calcium: 8.8 mg/dL — ABNORMAL LOW (ref 8.9–10.3)
GFR calc Af Amer: >60 mL/min (ref >60)
GFR calc non Af Amer: >60 mL/min (ref >60)
Glucose, Bld: 92 mg/dL (ref 65–99)
Potassium: 4.4 mmol/L (ref 3.5–5.1)
Sodium: 138 mmol/L (ref 135–145)

## 2014-09-27 LAB — CBC WITH DIFFERENTIAL/PLATELET
BASOS ABS: 0 10*3/uL (ref 0.0–0.1)
Basophils Relative: 0 % (ref 0–1)
EOS ABS: 0.1 10*3/uL (ref 0.0–0.7)
EOS PCT: 1 % (ref 0–5)
HCT: 41.7 % (ref 36.0–46.0)
HEMOGLOBIN: 13.4 g/dL (ref 12.0–15.0)
Lymphocytes Relative: 29 % (ref 12–46)
Lymphs Abs: 2.2 10*3/uL (ref 0.7–4.0)
MCH: 28.9 pg (ref 26.0–34.0)
MCHC: 32.1 g/dL (ref 30.0–36.0)
MCV: 89.9 fL (ref 78.0–100.0)
Monocytes Absolute: 0.4 10*3/uL (ref 0.1–1.0)
Monocytes Relative: 6 % (ref 3–12)
NEUTROS ABS: 4.7 10*3/uL (ref 1.7–7.7)
Neutrophils Relative %: 64 % (ref 43–77)
PLATELETS: 307 10*3/uL (ref 150–400)
RBC: 4.64 MIL/uL (ref 3.87–5.11)
RDW: 14.6 % (ref 11.5–15.5)
WBC: 7.4 10*3/uL (ref 4.0–10.5)

## 2014-09-28 MED ORDER — NEOMYCIN-POLYMYXIN-DEXAMETH 3.5-10000-0.1 OP SUSP
OPHTHALMIC | Status: AC
  Filled 2014-09-28: qty 5

## 2014-09-28 MED ORDER — LIDOCAINE HCL 3.5 % OP GEL
OPHTHALMIC | Status: AC
  Filled 2014-09-28: qty 1

## 2014-09-28 MED ORDER — PHENYLEPHRINE HCL 2.5 % OP SOLN
OPHTHALMIC | Status: AC
  Filled 2014-09-28: qty 15

## 2014-09-28 MED ORDER — LIDOCAINE HCL (PF) 1 % IJ SOLN
INTRAMUSCULAR | Status: AC
  Filled 2014-09-28: qty 2

## 2014-09-28 MED ORDER — TETRACAINE HCL 0.5 % OP SOLN
OPHTHALMIC | Status: AC
  Filled 2014-09-28: qty 2

## 2014-09-28 MED ORDER — CYCLOPENTOLATE-PHENYLEPHRINE OP SOLN OPTIME - NO CHARGE
OPHTHALMIC | Status: AC
  Filled 2014-09-28: qty 2

## 2014-10-01 ENCOUNTER — Ambulatory Visit (HOSPITAL_COMMUNITY)
Admission: RE | Admit: 2014-10-01 | Discharge: 2014-10-01 | Disposition: A | Discharge status: Home / Self Care | Payer: Medicare Other | Source: Ambulatory Visit | Attending: Ophthalmology | Admitting: Ophthalmology

## 2014-10-01 ENCOUNTER — Encounter (HOSPITAL_COMMUNITY): Payer: Self-pay | Admitting: Emergency Medicine

## 2014-10-01 ENCOUNTER — Ambulatory Visit (HOSPITAL_COMMUNITY): Payer: Medicare Other | Admitting: Anesthesiology

## 2014-10-01 ENCOUNTER — Encounter (HOSPITAL_COMMUNITY)
Admission: RE | Disposition: A | Discharge status: Home / Self Care | Payer: Self-pay | Source: Ambulatory Visit | Attending: Ophthalmology

## 2014-10-01 DIAGNOSIS — I509 Heart failure, unspecified: Secondary | ICD-10-CM | POA: Diagnosis not present

## 2014-10-01 DIAGNOSIS — Z79899 Other long term (current) drug therapy: Secondary | ICD-10-CM | POA: Insufficient documentation

## 2014-10-01 DIAGNOSIS — G510 Bell's palsy: Secondary | ICD-10-CM | POA: Insufficient documentation

## 2014-10-01 DIAGNOSIS — I10 Essential (primary) hypertension: Secondary | ICD-10-CM | POA: Insufficient documentation

## 2014-10-01 DIAGNOSIS — I444 Left anterior fascicular block: Secondary | ICD-10-CM | POA: Diagnosis not present

## 2014-10-01 DIAGNOSIS — H25812 Combined forms of age-related cataract, left eye: Secondary | ICD-10-CM | POA: Insufficient documentation

## 2014-10-01 DIAGNOSIS — Z791 Long term (current) use of non-steroidal anti-inflammatories (NSAID): Secondary | ICD-10-CM | POA: Insufficient documentation

## 2014-10-01 DIAGNOSIS — M1990 Unspecified osteoarthritis, unspecified site: Secondary | ICD-10-CM | POA: Insufficient documentation

## 2014-10-01 DIAGNOSIS — Z85828 Personal history of other malignant neoplasm of skin: Secondary | ICD-10-CM | POA: Insufficient documentation

## 2014-10-01 DIAGNOSIS — K219 Gastro-esophageal reflux disease without esophagitis: Secondary | ICD-10-CM | POA: Insufficient documentation

## 2014-10-01 DIAGNOSIS — Z981 Arthrodesis status: Secondary | ICD-10-CM | POA: Insufficient documentation

## 2014-10-01 DIAGNOSIS — R9431 Abnormal electrocardiogram [ECG] [EKG]: Secondary | ICD-10-CM | POA: Insufficient documentation

## 2014-10-01 DIAGNOSIS — H269 Unspecified cataract: Secondary | ICD-10-CM | POA: Diagnosis present

## 2014-10-01 HISTORY — PX: CATARACT EXTRACTION W/PHACO: SHX586

## 2014-10-01 SURGERY — PHACOEMULSIFICATION, CATARACT, WITH IOL INSERTION
Anesthesia: Monitor Anesthesia Care | Site: Eye | Laterality: Left

## 2014-10-01 MED ORDER — EPINEPHRINE HCL 1 MG/ML IJ SOLN
INTRAMUSCULAR | Status: AC
  Filled 2014-10-01: qty 1

## 2014-10-01 MED ORDER — LIDOCAINE HCL (PF) 1 % IJ SOLN
INTRAMUSCULAR | Status: DC | PRN
  Administered 2014-10-01: .6 mL

## 2014-10-01 MED ORDER — MIDAZOLAM HCL 2 MG/2ML IJ SOLN
1.0000 mg | INTRAMUSCULAR | Status: DC | PRN
  Administered 2014-10-01: 2 mg via INTRAVENOUS

## 2014-10-01 MED ORDER — FENTANYL CITRATE (PF) 100 MCG/2ML IJ SOLN
INTRAMUSCULAR | Status: AC
  Filled 2014-10-01: qty 2

## 2014-10-01 MED ORDER — CYCLOPENTOLATE-PHENYLEPHRINE 0.2-1 % OP SOLN
1.0000 [drp] | OPHTHALMIC | Status: AC
  Administered 2014-10-01 (×3): 1 [drp] via OPHTHALMIC

## 2014-10-01 MED ORDER — FENTANYL CITRATE (PF) 100 MCG/2ML IJ SOLN
25.0000 ug | INTRAMUSCULAR | Status: AC
  Administered 2014-10-01 (×2): 25 ug via INTRAVENOUS

## 2014-10-01 MED ORDER — LIDOCAINE HCL 3.5 % OP GEL
1.0000 "application " | Freq: Once | OPHTHALMIC | Status: AC
  Administered 2014-10-01: 1 via OPHTHALMIC

## 2014-10-01 MED ORDER — EPINEPHRINE HCL 1 MG/ML IJ SOLN
INTRAOCULAR | Status: DC | PRN
  Administered 2014-10-01: 500 mL

## 2014-10-01 MED ORDER — NEOMYCIN-POLYMYXIN-DEXAMETH 3.5-10000-0.1 OP SUSP
OPHTHALMIC | Status: DC | PRN
  Administered 2014-10-01: 2 [drp] via OPHTHALMIC

## 2014-10-01 MED ORDER — ACETAZOLAMIDE ER 500 MG PO CP12: 500.0000 mg | ORAL_CAPSULE | Freq: Two times a day (BID) | ORAL | Status: DC

## 2014-10-01 MED ORDER — PROVISC 10 MG/ML IO SOLN
INTRAOCULAR | Status: DC | PRN
  Administered 2014-10-01: 0.85 mL via INTRAOCULAR

## 2014-10-01 MED ORDER — PHENYLEPHRINE HCL 2.5 % OP SOLN
1.0000 [drp] | OPHTHALMIC | Status: AC
  Administered 2014-10-01 (×3): 1 [drp] via OPHTHALMIC

## 2014-10-01 MED ORDER — TETRACAINE HCL 0.5 % OP SOLN
1.0000 [drp] | OPHTHALMIC | Status: AC
  Administered 2014-10-01 (×3): 1 [drp] via OPHTHALMIC

## 2014-10-01 MED ORDER — POVIDONE-IODINE 5 % OP SOLN
OPHTHALMIC | Status: DC | PRN
  Administered 2014-10-01: 1 via OPHTHALMIC

## 2014-10-01 MED ORDER — MIDAZOLAM HCL 2 MG/2ML IJ SOLN
INTRAMUSCULAR | Status: AC
  Filled 2014-10-01: qty 2

## 2014-10-01 MED ORDER — BSS IO SOLN
INTRAOCULAR | Status: DC | PRN
  Administered 2014-10-01: 15 mL

## 2014-10-01 MED ORDER — LACTATED RINGERS IV SOLN
INTRAVENOUS | Status: DC
  Administered 2014-10-01: 75 mL/h via INTRAVENOUS

## 2014-10-01 SURGICAL SUPPLY — 10 items
CLOTH BEACON ORANGE TIMEOUT ST (SAFETY) ×1 IMPLANT
EYE SHIELD UNIVERSAL CLEAR (GAUZE/BANDAGES/DRESSINGS) ×2 IMPLANT
GLOVE BIOGEL PI IND STRL 7.0 (GLOVE) IMPLANT
GLOVE BIOGEL PI INDICATOR 7.0 (GLOVE) ×1
PAD ARMBOARD 7.5X6 YLW CONV (MISCELLANEOUS) ×1 IMPLANT
SIGHTPATH CAT PROC W REG LENS (Ophthalmic Related) ×2 IMPLANT
SYRINGE LUER LOK 1CC (MISCELLANEOUS) ×2 IMPLANT
TAPE SURG TRANSPORE 1 IN (GAUZE/BANDAGES/DRESSINGS) ×1 IMPLANT
TAPE SURGICAL TRANSPORE 1 IN (GAUZE/BANDAGES/DRESSINGS) ×1
WATER STERILE IRR 250ML POUR (IV SOLUTION) ×1 IMPLANT

## 2014-10-01 NOTE — Op Note (Signed)
Date of Admission: 10/01/2014  Date of Surgery: 10/01/2014   Pre-Op Dx: Cataract Left Eye  Post-Op Dx: Senile Combined Cataract Left  Eye,  Dx Code Y78.295H25.812  Surgeon: Gemma PayorKerry Amarie Tarte, M.D.  Assistants: None  Anesthesia: Topical with MAC  Indications: Painless, progressive loss of vision with compromise of daily activities.  Surgery: Cataract Extraction with Intraocular lens Implant Left Eye  Discription: The patient had dilating drops and viscous lidocaine placed into the Left eye in the pre-op holding area. After transfer to the operating room, a time out was performed. The patient was then prepped and draped. Beginning with a 75 degree blade a paracentesis port was made at the surgeon's 2 o'clock position. The anterior chamber was then filled with 1% non-preserved lidocaine. This was followed by filling the anterior chamber with Provisc.  A 2.74mm keratome blade was used to make a clear corneal incision at the temporal limbus.  A bent cystatome needle was used to create a continuous tear capsulotomy. Hydrodissection was performed with balanced salt solution on a Fine canula. The lens nucleus was then removed using the phacoemulsification handpiece. Residual cortex was removed with the I&A handpiece. The anterior chamber and capsular bag were refilled with Provisc. A posterior chamber intraocular lens was placed into the capsular bag with it's injector. The implant was positioned with the Kuglan hook. The Provisc was then removed from the anterior chamber and capsular bag with the I&A handpiece. Stromal hydration of the main incision and paracentesis port was performed with BSS on a Fine canula. The wounds were tested for leak which was negative. The patient tolerated the procedure well. There were no operative complications. The patient was then transferred to the recovery room in stable condition.  Complications: None  Specimen: None  EBL: None  Prosthetic device: Hoya iSert 250, power 19.0 D, SN  [id number].

## 2014-10-01 NOTE — H&P (Signed)
I have reviewed the H&P, the patient was re-examined, and I have identified no interval changes in medical condition and plan of care since the history and physical of record  

## 2014-10-01 NOTE — Transfer of Care (Signed)
Immediate Anesthesia Transfer of Care Note  Patient: Kerry Richardson  Procedure(s) Performed: Procedure(s) with comments: CATARACT EXTRACTION PHACO AND INTRAOCULAR LENS PLACEMENT LEFT EYE (Left) - CDE:8.15  Patient Location: Short Stay  Anesthesia Type:MAC  Level of Consciousness: awake  Airway & Oxygen Therapy: Patient Spontanous Breathing  Post-op Assessment: Report given to RN  Post vital signs: Reviewed  Last Vitals:  Filed Vitals:   10/01/14 1100  BP: 142/64  Pulse:   Temp:   Resp: 11    Complications: No apparent anesthesia complications

## 2014-10-01 NOTE — Discharge Instructions (Signed)

## 2014-10-01 NOTE — Anesthesia Preprocedure Evaluation (Signed)
Anesthesia Evaluation  Patient identified by MRN, date of birth, ID band Patient awake    Reviewed: Allergy & Precautions, H&P , NPO status , Patient's Chart, lab work & pertinent test results  History of Anesthesia Complications (+) PONV and history of anesthetic complications  Airway Mallampati: I  TM Distance: >3 FB Neck ROM: Limited    Dental  (+) Teeth Intact   Pulmonary neg pulmonary ROS, shortness of breath,  breath sounds clear to auscultation        Cardiovascular hypertension, +CHF negative cardio ROS  Rhythm:Regular Rate:Normal     Neuro/Psych  Headaches,    GI/Hepatic negative GI ROS, GERD-  ,  Endo/Other    Renal/GU      Musculoskeletal  (+) Arthritis - (spinal stenosis, chronic LBP, cervical fusion), Osteoarthritis,    Abdominal   Peds  Hematology   Anesthesia Other Findings   Reproductive/Obstetrics                             Anesthesia Physical Anesthesia Plan  ASA: III  Anesthesia Plan: MAC   Post-op Pain Management:    Induction: Intravenous  Airway Management Planned: Nasal Cannula  Additional Equipment:   Intra-op Plan:   Post-operative Plan:   Informed Consent: I have reviewed the patients History and Physical, chart, labs and discussed the procedure including the risks, benefits and alternatives for the proposed anesthesia with the patient or authorized representative who has indicated his/her understanding and acceptance.     Plan Discussed with:   Anesthesia Plan Comments:         Anesthesia Quick Evaluation

## 2014-10-01 NOTE — Anesthesia Postprocedure Evaluation (Signed)
  Anesthesia Post-op Note  Patient: Kerry Richardson  Procedure(s) Performed: Procedure(s) with comments: CATARACT EXTRACTION PHACO AND INTRAOCULAR LENS PLACEMENT LEFT EYE (Left) - CDE:8.15  Patient Location: Short Stay  Anesthesia Type:MAC  Level of Consciousness: awake, alert  and oriented  Airway and Oxygen Therapy: Patient Spontanous Breathing  Post-op Pain: none  Post-op Assessment: Post-op Vital signs reviewed, Patient's Cardiovascular Status Stable, Respiratory Function Stable, Patent Airway and No signs of Nausea or vomiting              Post-op Vital Signs: Reviewed and stable  Last Vitals:  Filed Vitals:   10/01/14 1100  BP: 142/64  Pulse:   Temp:   Resp: 11    Complications: No apparent anesthesia complications

## 2014-10-02 ENCOUNTER — Encounter (HOSPITAL_COMMUNITY): Payer: Self-pay | Admitting: Ophthalmology

## 2014-10-15 ENCOUNTER — Other Ambulatory Visit: Payer: Self-pay | Admitting: Family Medicine

## 2014-11-30 DIAGNOSIS — J309 Allergic rhinitis, unspecified: Principal | ICD-10-CM

## 2014-11-30 DIAGNOSIS — T7800XA Anaphylactic reaction due to unspecified food, initial encounter: Secondary | ICD-10-CM | POA: Insufficient documentation

## 2014-11-30 DIAGNOSIS — H1010 Acute atopic conjunctivitis, unspecified eye: Secondary | ICD-10-CM | POA: Insufficient documentation

## 2014-12-17 ENCOUNTER — Encounter: Payer: Self-pay | Admitting: Family Medicine

## 2014-12-17 ENCOUNTER — Ambulatory Visit (INDEPENDENT_AMBULATORY_CARE_PROVIDER_SITE_OTHER): Payer: Medicare Other | Admitting: Family Medicine

## 2014-12-17 VITALS — BP 152/80 | Ht 60.5 in | Wt 198.0 lb

## 2014-12-17 DIAGNOSIS — G5711 Meralgia paresthetica, right lower limb: Secondary | ICD-10-CM | POA: Diagnosis not present

## 2014-12-17 DIAGNOSIS — M170 Bilateral primary osteoarthritis of knee: Secondary | ICD-10-CM

## 2014-12-17 DIAGNOSIS — Z23 Encounter for immunization: Secondary | ICD-10-CM | POA: Diagnosis not present

## 2014-12-17 DIAGNOSIS — M6530 Trigger finger, unspecified finger: Secondary | ICD-10-CM

## 2014-12-17 NOTE — Progress Notes (Signed)
   Subjective:  Patient arrives office with 3 discrete and separate concerns.  Patient ID: Kerry Richardson, female    DOB: [birth year], 74 y.o.   MRN: [id number]  HPILeft thumb pain. Started months ago. Pops in and out. Takes ibuprofen sometimes. At times digit jumps in and out, not getting stuck yet,    Patient also experiencing pain in pain in both legs, primarily the feet. Describes it as a deep ache. It occurs nearly daily.. Notes top of feet and toe right greater than left  Painful and pins and needles at times. This is right anterior thigh. Patient has had this in the past. Diagnoses neuralgia paresthetica. Somewhat worse now. Not ready to go on chronic medication quite yet.  Has to move around in bed at times  Thigh aching Started several months ago. Getting worse. Hurts any times, not burning more aching and pins and needles , aching pain at times  Requesting pneumonia and flu vaccine today.   Exercise mostly with work, not focused on it  Does not take meds for it   Review of Systems No headache no chest pain no back pain no abdominal pain no change in bowel habits    Objective:   Physical Exam Alert vitals stable lungs clear heart regular in rhythm hands pulses intact sensation intact Heberden's nodes present positive pain at left thenar digit joint no frank trigger thumb noted with exam. Knees bilateral crepitations pulses intact. Sensation sensitive distally. Anterior thigh diminished sensation with some tenderness to deep palpation       Assessment & Plan:  Impression 1 trigger thumb discussed at length #2 bilateral foot pain right greater than left likely arthritis with metatarsalgia aspect discussed at length #3 chronic meralgia paresthetica somewhat worse patient already for meds. Not ready for a mega-workup including nerve conduction studies referral etc. also discussed at length plan 25 minutes spent most in discussion plan flu shot today Prevnar injection. An  inflammatory medicine for legs exercise encourage. WSL

## 2014-12-18 ENCOUNTER — Ambulatory Visit (INDEPENDENT_AMBULATORY_CARE_PROVIDER_SITE_OTHER): Payer: Medicare Other | Admitting: Allergy and Immunology

## 2014-12-18 ENCOUNTER — Encounter: Payer: Self-pay | Admitting: Allergy and Immunology

## 2014-12-18 VITALS — BP 164/74 | HR 90 | Temp 98.9°F | Resp 16

## 2014-12-18 DIAGNOSIS — J309 Allergic rhinitis, unspecified: Secondary | ICD-10-CM

## 2014-12-18 DIAGNOSIS — T7800XD Anaphylactic reaction due to unspecified food, subsequent encounter: Secondary | ICD-10-CM

## 2014-12-18 MED ORDER — EPINEPHRINE 0.3 MG/0.3ML IJ SOAJ: 0.3000 mg | Freq: Once | INTRAMUSCULAR | Status: DC

## 2014-12-18 NOTE — Patient Instructions (Signed)
Obtain labs after November 2016  Continue all meat avoidance as previously.  Epi-pen/Benadryl as needed.

## 2014-12-18 NOTE — Progress Notes (Signed)
FOLLOW UP NOTE  RE: Kerry Richardson MRN: [id number] Date of Birth: 1940-10-16 [facility]    Kerry Richardson is a 74 y.o. female who returns to the office in follow-up of alpha-gal allergy and allergic rhinoconjunctivitis.  She reports doing well and interested in repeat of labs.  She denies any ED or urgent care visits or Prednisone.  She completed antibiotics in the spring through her primary care physician for cough when she also used albuterol  She reports no further albuterol use, with normal sleep and activity.  Using Zyrtec daily is beneficial.  No other new concerns.   CURRENT MEDICAL TREATMENT  Current outpatient prescriptions:  .  albuterol (PROVENTIL HFA;VENTOLIN HFA) 108 (90 BASE) MCG/ACT inhaler, Inhale 2 puffs into the lungs every 6 (six) hours as needed for wheezing or shortness of breath. .  cetirizine (ZYRTEC) 10 MG tablet, Take 10 mg by mouth at bedtime.  .  Cholecalciferol (VITAMIN D) 2000 UNITS CAPS, Take 1 capsule by mouth daily.  .  DiphenhydrAMINE HCl (BENADRYL PO), Take by mouth as needed.  Marland Kitchen  EPINEPHrine 0.3 mg/0.3 mL IJ SOAJ injection, Inject 0.3 mLs (0.3 mg total) into the muscle once. Marland Kitchen  ketoconazole (NIZORAL) 2 % cream, APPLY TO AFFECTED AREA TWICE DAILY.  DRUG ALLERGY: Allergies as of 12/18/2014 - Review Complete 12/18/2014  Allergen Reaction Noted  . Alpha-gal Anaphylaxis 11/30/2014  . Beef-derived products Swelling 09/27/2014  . Codeine Nausea Only   . Pork-derived products Swelling 09/27/2014    PHYSICAL EXAM: BP 164/74 mmHg  Pulse 90  Temp(Src) 98.9 F (37.2 C) (Oral)  Resp 16  SpO2 97% Physical Exam  Constitutional: She appears well-nourished.  HENT:  Right Ear: Tympanic membrane and ear canal normal.  Left Ear: Tympanic membrane and ear canal normal.  Nose: Nose normal.  Mouth/Throat: Oropharynx is clear and moist and mucous membranes are normal.  Cardiovascular: Normal rate and normal heart sounds.   Pulmonary/Chest:  Effort normal and breath sounds normal.  Skin: Skin is warm and dry.    DIAGNOSTICS: FVC 1.81--75%; FEV1 1.47--78%.  ASSESSMENT AND PLAN: 1.  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 2.  Allergic rhinoconjunctivitis--well controlled. 3.  History of  Cough--patient report of albuterol use in the spring-- currently symptomatic.     Obtain repeat alpha gal labs after November 2016.   Continue all meat avoidance as previously.   Epi-pen/Benadryl as needed.   Continue current medication regime.   Follow-up in  9 months or sooner if needed and by phone with lab results.   Colonel Bald, MD

## 2015-01-31 ENCOUNTER — Ambulatory Visit (INDEPENDENT_AMBULATORY_CARE_PROVIDER_SITE_OTHER): Payer: Medicare Other | Admitting: Family Medicine

## 2015-01-31 ENCOUNTER — Encounter: Payer: Self-pay | Admitting: Family Medicine

## 2015-01-31 VITALS — BP 138/84 | Temp 98.6°F | Ht 60.5 in | Wt 200.6 lb

## 2015-01-31 DIAGNOSIS — R350 Frequency of micturition: Secondary | ICD-10-CM | POA: Diagnosis not present

## 2015-01-31 DIAGNOSIS — N3000 Acute cystitis without hematuria: Secondary | ICD-10-CM

## 2015-01-31 LAB — POCT URINALYSIS DIPSTICK
PH UA: 5
PROTEIN UA: 30

## 2015-01-31 MED ORDER — NITROFURANTOIN MONOHYD MACRO 100 MG PO CAPS: 100.0000 mg | ORAL_CAPSULE | Freq: Two times a day (BID) | ORAL | Status: AC

## 2015-01-31 NOTE — Progress Notes (Signed)
   Subjective:    Patient ID: Kerry LimberRuth S Echeverri, female    DOB: Jan 14, 1941, 74 y.o.   MRN: [id number]  Urinary Tract Infection  This is a new problem. Episode onset: 2 weeks. Associated symptoms include frequency.  relates dysuria and frequency symptoms over the past couple days bowel loader for a couple weeks    Review of Systems  Genitourinary: Positive for frequency.  denies flank pain fever chills     Objective:   Physical Exam  Lungs clear heart regular flanks nontender abdomen soft UA with WBCs      Assessment & Plan:  A/P UTI antibiotics prescribed if high fever chills or worse follow-up

## 2015-02-06 ENCOUNTER — Other Ambulatory Visit: Payer: Self-pay | Admitting: Allergy and Immunology

## 2015-02-08 ENCOUNTER — Telehealth: Payer: Self-pay

## 2015-02-08 NOTE — Telephone Encounter (Signed)
Patient called to see if her Lab results have come in. I advised patient it can take up to two weeks and we would call as soon as the Dr reviews them.   Thanks

## 2015-02-09 LAB — ALPHA-GAL PANEL
Allergen, Mutton, f88: <0.1 kU/L
Allergen, Pork, f26: <0.1 kU/L
Beef: 0.31 kU/L — ABNORMAL HIGH
Galactose-alpha-1,3-galactose IgE: 0.81 kU/L — ABNORMAL HIGH (ref <0.35)

## 2015-02-20 ENCOUNTER — Telehealth: Payer: Self-pay | Admitting: Family Medicine

## 2015-02-20 MED ORDER — CEFPROZIL 500 MG PO TABS
500.0000 mg | ORAL_TABLET | Freq: Two times a day (BID) | ORAL | Status: DC
Start: 2015-02-20 — End: 2015-03-05

## 2015-02-20 NOTE — Telephone Encounter (Signed)
Cefzil 500 mg 1 twice a day 5 days follow-up if ongoing troubles

## 2015-02-20 NOTE — Telephone Encounter (Signed)
Seen 11/10. Prescribed macrobid. Symptoms got better but came back 3 days ago. No fever, no low back pain, having frequency and low abd pressure.

## 2015-02-20 NOTE — Telephone Encounter (Signed)
Pt seen on 11/10 for UTI, issued antibiotics, was doing ok but the  Symptoms are coming back, wants to know if she needs or can get  Another antibiotic?   Having frequency again, pressure, not a lot of flow. No burning yet  But wants to address it before it does.   [place] [facility]

## 2015-02-20 NOTE — Telephone Encounter (Signed)
Not[facility].

## 2015-02-22 NOTE — Progress Notes (Signed)
I spoke with patient and informed her of test results.  She will continue avoidance of beef, pork, and lamb.

## 2015-03-05 ENCOUNTER — Ambulatory Visit (INDEPENDENT_AMBULATORY_CARE_PROVIDER_SITE_OTHER): Payer: Medicare Other | Admitting: Family Medicine

## 2015-03-05 ENCOUNTER — Encounter: Payer: Self-pay | Admitting: Family Medicine

## 2015-03-05 VITALS — BP 136/82 | Ht 60.5 in | Wt 199.0 lb

## 2015-03-05 DIAGNOSIS — Z0000 Encounter for general adult medical examination without abnormal findings: Secondary | ICD-10-CM

## 2015-03-05 DIAGNOSIS — E785 Hyperlipidemia, unspecified: Secondary | ICD-10-CM

## 2015-03-05 NOTE — Progress Notes (Signed)
   Subjective:    Patient ID: Kerry Richardson, female    DOB: June 10, 1940, 74 y.o.   MRN: [id number]  HPI  AWV- Annual Wellness Visit  The patient was seen for their annual wellness visit. The patient's past medical history, surgical history, and family history were reviewed. Pertinent vaccines were reviewed ( tetanus, pneumonia, shingles, flu) The patient's medication list was reviewed and updated.  The height and weight were entered. The patient's current BMI is:38.22  Cognitive screening was completed. Outcome of Mini - Cog: pass  Falls within the past 6 months: none  Current tobacco usage: none (All patients who use tobacco were given written and verbal information on quitting)  Recent listing of emergency department/hospitalizations over the past year were reviewed.  current specialist the patient sees on a regular basis: no   Medicare annual wellness visit patient questionnaire was reviewed.  A written screening schedule for the patient for the next 5-10 years was given. Appropriate discussion of followup regarding next visit was discussed.  Some walking but not al ot Colonoscopy never, no fam hx, ,no constip to spk of  Flu pneum  [place] [facility]      Review of Systems  Constitutional: Negative for activity change, appetite change and fatigue.  HENT: Negative for congestion, ear discharge and rhinorrhea.   Eyes: Negative for discharge.  Respiratory: Negative for cough, chest tightness and wheezing.   Cardiovascular: Negative for chest pain.  Gastrointestinal: Negative for vomiting and abdominal pain.  Genitourinary: Negative for frequency and difficulty urinating.  Musculoskeletal: Negative for neck pain.  Allergic/Immunologic: Negative for environmental allergies and food allergies.  Neurological: Negative for weakness and headaches.  Psychiatric/Behavioral: Negative for behavioral problems and agitation.  All other systems reviewed and are negative.        Objective:   Physical Exam  Constitutional: She is oriented to person, place, and time. She appears well-developed and well-nourished.  Morbid obesity present  HENT:  Head: Normocephalic.  Right Ear: External ear normal.  Left Ear: External ear normal.  Eyes: Pupils are equal, round, and reactive to light.  Neck: Normal range of motion. No thyromegaly present.  Cardiovascular: Normal rate, regular rhythm, normal heart sounds and intact distal pulses.   No murmur heard. Pulmonary/Chest: Effort normal and breath sounds normal. No respiratory distress. She has no wheezes.  Abdominal: Soft. Bowel sounds are normal. She exhibits no distension and no mass. There is no tenderness.  Musculoskeletal: Normal range of motion. She exhibits no edema or tenderness.  Lymphadenopathy:    She has no cervical adenopathy.  Neurological: She is alert and oriented to person, place, and time. She exhibits normal muscle tone.  Skin: Skin is warm and dry.  Psychiatric: She has a normal mood and affect. Her behavior is normal.  Vitals reviewed.         Assessment & Plan:  Impression 1 well with all exam up-to-date on immunizations #2 morbid obesity discussed exercise diet discussed #3 patient [place] declines colonoscopy. She hadn't said she would allow us to schedule a mammogram. She abruptly decided not to do this when checking out, promising she would do it on her own plan as noted above. Diet exercise discussed, appropriate blood work with history of elevated cholesterol

## 2015-03-09 LAB — HEPATIC FUNCTION PANEL
ALBUMIN: 4.2 g/dL (ref 3.5–4.8)
ALT: 13 IU/L (ref 0–32)
AST: 14 IU/L (ref 0–40)
Alkaline Phosphatase: 79 IU/L (ref 39–117)
Bilirubin Total: 0.3 mg/dL (ref 0.0–1.2)
Bilirubin, Direct: 0.09 mg/dL (ref 0.00–0.40)
TOTAL PROTEIN: 6.8 g/dL (ref 6.0–8.5)

## 2015-03-09 LAB — LIPID PANEL
Chol/HDL Ratio: 2.9 ratio units (ref 0.0–4.4)
Cholesterol, Total: 241 mg/dL — ABNORMAL HIGH (ref 100–199)
HDL: 82 mg/dL (ref >39)
LDL Calculated: 139 mg/dL — ABNORMAL HIGH (ref 0–99)
Triglycerides: 99 mg/dL (ref 0–149)
VLDL CHOLESTEROL CAL: 20 mg/dL (ref 5–40)

## 2015-03-13 ENCOUNTER — Encounter: Payer: Self-pay | Admitting: Family Medicine

## 2015-05-01 DIAGNOSIS — L570 Actinic keratosis: Secondary | ICD-10-CM | POA: Diagnosis not present

## 2015-05-01 DIAGNOSIS — Z85828 Personal history of other malignant neoplasm of skin: Secondary | ICD-10-CM | POA: Diagnosis not present

## 2015-05-01 DIAGNOSIS — D1801 Hemangioma of skin and subcutaneous tissue: Secondary | ICD-10-CM | POA: Diagnosis not present

## 2015-05-01 DIAGNOSIS — L821 Other seborrheic keratosis: Secondary | ICD-10-CM | POA: Diagnosis not present

## 2015-05-01 DIAGNOSIS — D485 Neoplasm of uncertain behavior of skin: Secondary | ICD-10-CM | POA: Diagnosis not present

## 2015-05-01 DIAGNOSIS — C44722 Squamous cell carcinoma of skin of right lower limb, including hip: Secondary | ICD-10-CM | POA: Diagnosis not present

## 2015-07-03 DIAGNOSIS — C44722 Squamous cell carcinoma of skin of right lower limb, including hip: Secondary | ICD-10-CM | POA: Diagnosis not present

## 2015-07-16 ENCOUNTER — Ambulatory Visit (INDEPENDENT_AMBULATORY_CARE_PROVIDER_SITE_OTHER): Payer: Medicare Other | Admitting: Family Medicine

## 2015-07-16 ENCOUNTER — Encounter: Payer: Self-pay | Admitting: Family Medicine

## 2015-07-16 ENCOUNTER — Ambulatory Visit: Payer: Medicare Other

## 2015-07-16 VITALS — BP 134/74 | Ht 60.5 in | Wt 200.1 lb

## 2015-07-16 DIAGNOSIS — W268XXA Contact with other sharp object(s), not elsewhere classified, initial encounter: Secondary | ICD-10-CM | POA: Diagnosis not present

## 2015-07-16 DIAGNOSIS — Z23 Encounter for immunization: Secondary | ICD-10-CM | POA: Diagnosis not present

## 2015-07-16 DIAGNOSIS — S61412A Laceration without foreign body of left hand, initial encounter: Secondary | ICD-10-CM

## 2015-07-16 DIAGNOSIS — L03012 Cellulitis of left finger: Secondary | ICD-10-CM

## 2015-07-16 MED ORDER — AZITHROMYCIN 250 MG PO TABS: ORAL_TABLET | ORAL | Status: DC

## 2015-07-16 NOTE — Progress Notes (Signed)
   Subjective:    Patient ID: Vertell LimberRuth S Fraleigh, female    DOB: 1940-10-06, 75 y.o.   MRN: [id number]  Laceration  The incident occurred 3 to 5 days ago. The laceration is located on the left hand. The laceration mechanism was a metal edge. She reports no foreign bodies present. Her tetanus status is out of date.    Experience in a fall shin crush injury. Tremendous bleeding the first couple days. Notes distal pain. Next  No fever or chills Patient states no other concerns this visit.   Review of Systems No injury elsewhere no elbow pain no rash    Objective:   Physical Exam  Alert vital stable lungs clear heart rare rhythm distal finger on a avulsion injury. Some weeping still very wound erythema and tenderness      Assessment & Plan:  Impression finger injury with question secondary cellulitis and tetanus prophylaxis discussed plan DVT. Z-Pak symptom care and frequent dressings expect slow resolution patient may develop an ulceration at a avulsion site

## 2015-09-21 IMAGING — CR DG CHEST 1V
1 series · 1 of 1 positions shown · non-contrast
Comparison: Chest x-ray 05/08/2013.

CLINICAL DATA: Dyspnea and nausea.

EXAM:
CHEST - 1 VIEW

[ap]
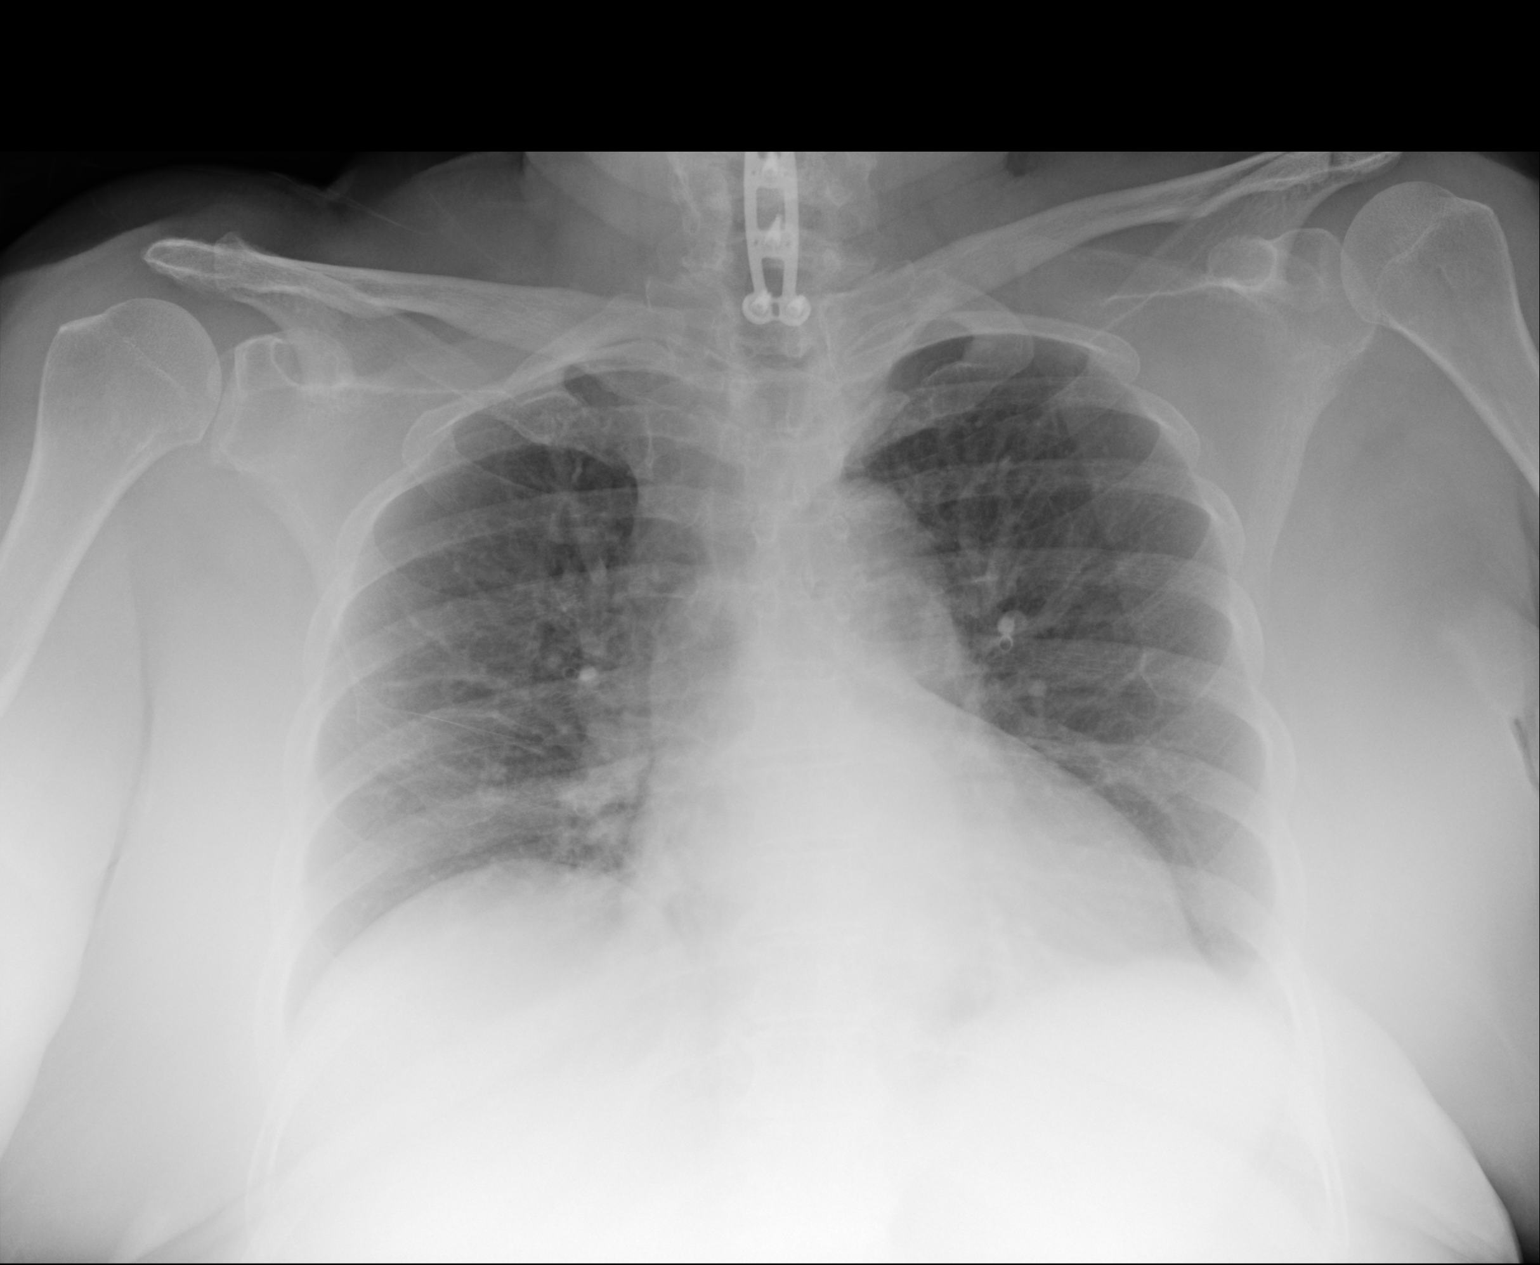

[1 of 1 positions shown; findings below may reference images not displayed]

FINDINGS: Lung volumes are low. No consolidative airspace disease. No pleural
effusions. No pneumothorax. No pulmonary nodule or mass noted.
Pulmonary vasculature and the cardiomediastinal silhouette are
within normal limits. Orthopedic fixation hardware in the cervical
spine incidentally noted.
IMPRESSION: Low lung volumes without radiographic evidence of acute
cardiopulmonary disease.

## 2015-09-23 IMAGING — CR DG CHEST 1V PORT
1 series · 1 of 1 positions shown · non-contrast
Comparison: DG CHEST 1 VIEW dated 07/09/2013

CLINICAL DATA: Shortness of breath

EXAM:
PORTABLE CHEST - 1 VIEW

[portable]
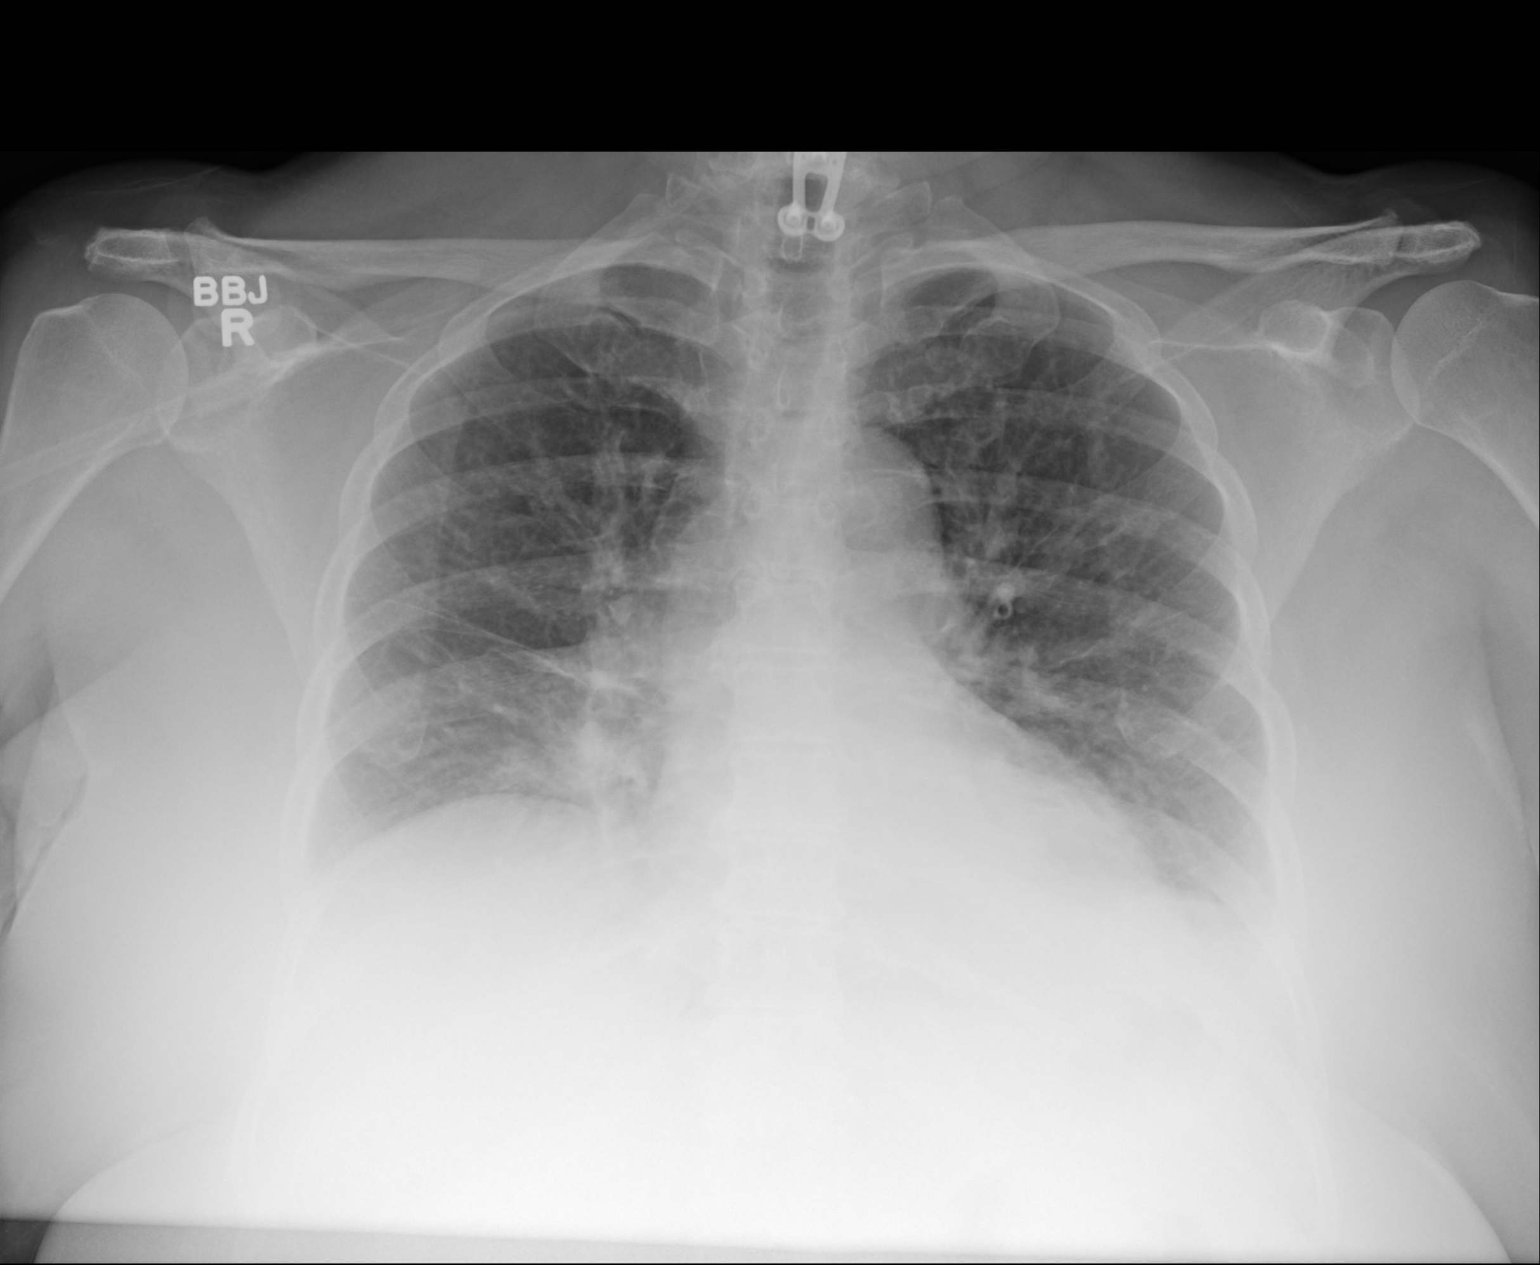

[1 of 1 positions shown; findings below may reference images not displayed]

FINDINGS: Shallow inspiration. Heart size and pulmonary vascularity are normal
for inspiratory effort. Linear atelectasis in the lung bases. No
focal consolidation. No blunting of costophrenic angles. No
pneumothorax. Postoperative changes in the cervical spine.
IMPRESSION: Shallow inspiration.  No evidence of active pulmonary disease.

## 2015-12-17 ENCOUNTER — Other Ambulatory Visit: Payer: Self-pay | Admitting: Family Medicine

## 2016-01-01 DIAGNOSIS — H40053 Ocular hypertension, bilateral: Secondary | ICD-10-CM | POA: Diagnosis not present

## 2016-01-07 ENCOUNTER — Other Ambulatory Visit: Payer: Self-pay | Admitting: Family Medicine

## 2016-01-07 ENCOUNTER — Telehealth: Payer: Self-pay

## 2016-01-07 DIAGNOSIS — Z1231 Encounter for screening mammogram for malignant neoplasm of breast: Secondary | ICD-10-CM

## 2016-01-07 NOTE — Telephone Encounter (Signed)
Spoke with patient and discussed results from November 2016. Patient will continue to avoid all beef, pork, lamb and deer. She scheduled follow up appointment for November.

## 2016-01-07 NOTE — Telephone Encounter (Signed)
Patient is wanting to know is she able to eat pork or not. Also she wants to know how much did her levels change in the past years.  Please Advise

## 2016-01-10 ENCOUNTER — Other Ambulatory Visit: Payer: Self-pay | Admitting: Family Medicine

## 2016-01-10 ENCOUNTER — Ambulatory Visit (HOSPITAL_COMMUNITY)
Admission: RE | Admit: 2016-01-10 | Discharge: 2016-01-10 | Disposition: A | Discharge status: Home / Self Care | Payer: Medicare Other | Source: Ambulatory Visit | Attending: Family Medicine | Admitting: Family Medicine

## 2016-01-10 DIAGNOSIS — Z1231 Encounter for screening mammogram for malignant neoplasm of breast: Secondary | ICD-10-CM

## 2016-01-14 ENCOUNTER — Other Ambulatory Visit: Payer: Self-pay | Admitting: Family Medicine

## 2016-01-14 DIAGNOSIS — R928 Other abnormal and inconclusive findings on diagnostic imaging of breast: Secondary | ICD-10-CM

## 2016-01-16 ENCOUNTER — Ambulatory Visit: Payer: Medicare Other | Admitting: Allergy

## 2016-01-28 ENCOUNTER — Ambulatory Visit (HOSPITAL_COMMUNITY)
Admission: RE | Admit: 2016-01-28 | Discharge: 2016-01-28 | Disposition: A | Discharge status: Home / Self Care | Payer: Medicare Other | Source: Ambulatory Visit | Attending: Family Medicine | Admitting: Family Medicine

## 2016-01-28 DIAGNOSIS — R921 Mammographic calcification found on diagnostic imaging of breast: Secondary | ICD-10-CM | POA: Diagnosis not present

## 2016-01-28 DIAGNOSIS — R928 Other abnormal and inconclusive findings on diagnostic imaging of breast: Secondary | ICD-10-CM | POA: Insufficient documentation

## 2016-01-30 ENCOUNTER — Encounter: Payer: Self-pay | Admitting: Allergy

## 2016-01-30 ENCOUNTER — Ambulatory Visit (INDEPENDENT_AMBULATORY_CARE_PROVIDER_SITE_OTHER): Payer: Medicare Other | Admitting: Allergy

## 2016-01-30 VITALS — BP 160/82 | HR 82 | Temp 97.7°F | Resp 18 | Ht 60.5 in | Wt 193.6 lb

## 2016-01-30 DIAGNOSIS — Z91018 Allergy to other foods: Secondary | ICD-10-CM

## 2016-01-30 DIAGNOSIS — J309 Allergic rhinitis, unspecified: Secondary | ICD-10-CM | POA: Diagnosis not present

## 2016-01-30 DIAGNOSIS — H1010 Acute atopic conjunctivitis, unspecified eye: Secondary | ICD-10-CM | POA: Diagnosis not present

## 2016-01-30 NOTE — Progress Notes (Signed)
Follow-up Note  RE: Kerry LimberRuth S Magley MRN: [id number] DOB: [birth year] Date of Office Visit: 01/30/2016   History of present illness: Kerry Richardson is a 75 y.o. female presenting today for follow-up of alpha gal allergy and allergic rhinoconjunctivitis. She was last seen in our office by Dr. Willa RoughHicks in September 2016.   She reports she has been very diligent to avoid tick and chiggers.  She states that if she ever had tick bites she would write down the date but she hasn't needed to do that over the past year. She has been avoiding red meats for the past 2 years.  She has access to EpiPen and has not needed use it. Her allergy symptoms have been well controlled. She takes zyrtec nightly.     Review of systems: Review of Systems  Constitutional: Negative for chills and fever.  HENT: Negative for congestion, sinus pain and sore throat.   Eyes: Negative for redness.  Respiratory: Negative for cough, shortness of breath and wheezing.   Cardiovascular: Negative for chest pain.  Gastrointestinal: Negative for heartburn, nausea and vomiting.  Skin: Negative for itching and rash.    All other systems negative unless noted above in HPI  Past medical/social/surgical/family history have been reviewed and are unchanged unless specifically indicated below.  No changes  Medication List:   Medication List       Accurate as of 01/30/16 11:34 AM. Always use your most recent med list.          albuterol 108 (90 Base) MCG/ACT inhaler Commonly known as:  PROVENTIL HFA;VENTOLIN HFA Inhale 2 puffs into the lungs every 6 (six) hours as needed for wheezing or shortness of breath.   azithromycin 250 MG tablet Commonly known as:  ZITHROMAX Take as directed.   BENADRYL PO Take by mouth as needed. Reported on 07/16/2015   cetirizine 10 MG tablet Commonly known as:  ZYRTEC Take 10 mg by mouth at bedtime.   EPINEPHrine 0.3 mg/0.3 mL Soaj injection Commonly known as:  EPI-PEN Inject 0.3 mLs (0.3 mg  total) into the muscle once.   ketoconazole 2 % cream Commonly known as:  NIZORAL APPLY TO AFFECTED AREA TWICE DAILY.   Vitamin D 2000 units Caps Take 1 capsule by mouth daily.       Known medication allergies: Allergies  Allergen Reactions  . Alpha-Gal Anaphylaxis    ALLERGY TO MAMMAL MEAT- PORK, BEEF, LAMB, ETC.  . Beef-Derived Products Swelling  . Codeine Nausea Only  . Pork-Derived Products Swelling     Physical examination: Blood pressure (!) 160/82, pulse 82, temperature 97.7 F (36.5 C), temperature source Oral, resp. rate 18, height 5' 0.5" (1.537 m), weight 193 lb 9.6 oz (87.8 kg), SpO2 96 %.  General: Alert, interactive, in no acute distress. HEENT: TMs pearly gray, turbinates non-edematous without discharge, post-pharynx non erythematous. Neck: Supple without lymphadenopathy. Lungs: Clear to auscultation without wheezing, rhonchi or rales. {no increased work of breathing. CV: Normal S1, S2 without murmurs. Abdomen: Nondistended, nontender. Skin: Warm and dry, without lesions or rashes. Extremities:  No clubbing, cyanosis or edema. Neuro:   Grossly intact.  Diagnositics/Labs: Labs:  Component     Latest Ref Rng & Units 02/06/2015  Beef     kU/L 0.31 (H)  Allergen, Pork, f26     kU/L <0.10  Allergen, Mutton, f88     kU/L <0.10  Galactose-alpha-1,3-galactose IgE     <0.35 kU/L 0.81 (H)    Assessment and plan:   Alpha  gal allergy  - Obtain serum alpha-gal panel.  If levels are lower than 2016 labs will recommend an in-office challenge to red meat (would allow for at least 6 hours for the challenge).   - Continue all red meat avoidance as previously.  - have access to EpiPen at all times  Allergic rhinoconjunctivitis - Continue Zyrtec daily for allergy symptoms relief.   Follow-up 1 year or sooner for food challenge  I appreciate the opportunity to take part in Faron's care. Please do not hesitate to contact me with  questions.  Sincerely,   Margo AyeShaylar Jhonny Calixto, MD Allergy/Immunology [facility]

## 2016-01-30 NOTE — Patient Instructions (Signed)
Obtain alpha-gal panel.  We will call you with results once they return.   If levels are lower than 2016 labs will recommend an in-office challenge to red meat (would allow for at least 6 hours for the challenge).   Continue all red meat avoidance as previously.  Epi-pen/Benadryl as needed.  Continue Zyrtec daily for allergy symptoms relief.   Follow-up 1 year or sooner for food challenge

## 2016-02-05 LAB — ALPHA-GAL PANEL
BEEF IGE: 0.81 kU/L — AB (ref <0.35)
CLASS: 2
Class: 0
Class: 0
Galactose-alpha-1,3-galactose IgE*: 4.24 kU/L — ABNORMAL HIGH (ref <0.35)
Lamb/Mutton IgE: <0.1 kU/L (ref <0.35)
Pork IgE: <0.1 kU/L (ref <0.35)

## 2016-02-06 ENCOUNTER — Telehealth: Payer: Self-pay

## 2016-02-06 NOTE — Telephone Encounter (Signed)
-----   Message from [facility] Larose HiresPatricia Padgett, MD sent at 02/06/2016 10:31 AM EST ----- Please let pt know that unfortunately her alpha-gal labs are higher than last year.  2016 alpha gal IgE was 0.81 and this year it is much higher at 4.24.   Her beef level was 0.31 in 2016 and this year is 0.81.    Thus since labs are trending in the wrong direction will need to continue avoidance of red meat and repeat levels next year.

## 2016-02-06 NOTE — Telephone Encounter (Signed)
Clld pt  - advsd of lab results. Pt is not happy but understands and has no other questions at this time.

## 2016-02-20 ENCOUNTER — Ambulatory Visit: Payer: Medicare Other

## 2016-02-25 ENCOUNTER — Encounter: Payer: Self-pay | Admitting: Nurse Practitioner

## 2016-02-25 ENCOUNTER — Ambulatory Visit (INDEPENDENT_AMBULATORY_CARE_PROVIDER_SITE_OTHER): Payer: Medicare Other | Admitting: Nurse Practitioner

## 2016-02-25 VITALS — BP 116/78 | Temp 98.2°F | Ht 60.5 in | Wt 191.8 lb

## 2016-02-25 DIAGNOSIS — B9689 Other specified bacterial agents as the cause of diseases classified elsewhere: Secondary | ICD-10-CM

## 2016-02-25 DIAGNOSIS — J069 Acute upper respiratory infection, unspecified: Secondary | ICD-10-CM

## 2016-02-25 MED ORDER — ALBUTEROL SULFATE HFA 108 (90 BASE) MCG/ACT IN AERS: 2.0000 | INHALATION_SPRAY | RESPIRATORY_TRACT | 0 refills | Status: DC | PRN

## 2016-02-25 MED ORDER — AZITHROMYCIN 250 MG PO TABS: ORAL_TABLET | ORAL | 0 refills | Status: DC

## 2016-02-25 MED ORDER — BENZONATATE 100 MG PO CAPS: 100.0000 mg | ORAL_CAPSULE | Freq: Three times a day (TID) | ORAL | 0 refills | Status: DC | PRN

## 2016-02-25 NOTE — Progress Notes (Signed)
Subjective:  Presents for complaints of sore throat frequent cough for over a week. Producing yellow mucus. Runny nose. Sore throat. No documented fever. No headache or ear pain. No wheezing but has had slight shortness of breath with talking or activity. Has had some wheezing in the past. Thinks her inhaler is out of date. No vomiting diarrhea or abdominal pain.  Objective:   BP 116/78   Temp 98.2 F (36.8 C) (Oral)   Ht 5' 0.5" (1.537 m)   Wt 191 lb 12.8 oz (87 kg)   BMI 36.84 kg/m  NAD. Alert, oriented. TMs clear effusion, more on the left. No erythema. Posterior pharynx moderately erythematous with yellow PND noted. Neck supple with mild soft anterior adenopathy. Lungs clear. Heart regular rate rhythm. Mild shortness of breath with talking. Normal color. No tachypnea. Normal airflow to auscultation.  Assessment: Bacterial upper respiratory infection  Plan:  Meds ordered this encounter  Medications  . benzonatate (TESSALON) 100 MG capsule    Sig: Take 1 capsule (100 mg total) by mouth 3 (three) times daily as needed for cough.    Dispense:  30 capsule    Refill:  0    Order Specific Question:   Supervising Provider    Answer:   Merlyn AlbertLUKING, WILLIAM S [[id number]]  . albuterol (PROVENTIL HFA;VENTOLIN HFA) 108 (90 Base) MCG/ACT inhaler    Sig: Inhale 2 puffs into the lungs every 4 (four) hours as needed for wheezing or shortness of breath.    Dispense:  1 Inhaler    Refill:  0    Order Specific Question:   Supervising Provider    Answer:   Merlyn AlbertLUKING, WILLIAM S [[id number]]  . azithromycin (ZITHROMAX Z-PAK) 250 MG tablet    Sig: Take 2 tablets (500 mg) on  Day 1,  followed by 1 tablet (250 mg) once daily on Days 2 through 5.    Dispense:  6 each    Refill:  0    Order Specific Question:   Supervising Provider    Answer:   Merlyn AlbertLUKING, WILLIAM S [[id number]]   Mucinex DM as directed. Warning signs reviewed. Call back by the end of the week if no improvement, sooner if worse.

## 2016-03-09 ENCOUNTER — Encounter: Payer: Self-pay | Admitting: Family Medicine

## 2016-03-09 ENCOUNTER — Ambulatory Visit (INDEPENDENT_AMBULATORY_CARE_PROVIDER_SITE_OTHER): Payer: Medicare Other | Admitting: Family Medicine

## 2016-03-09 VITALS — BP 144/76 | Ht 60.5 in | Wt 193.0 lb

## 2016-03-09 DIAGNOSIS — Z0000 Encounter for general adult medical examination without abnormal findings: Secondary | ICD-10-CM

## 2016-03-09 DIAGNOSIS — E785 Hyperlipidemia, unspecified: Secondary | ICD-10-CM | POA: Diagnosis not present

## 2016-03-09 DIAGNOSIS — Z23 Encounter for immunization: Secondary | ICD-10-CM

## 2016-03-09 NOTE — Progress Notes (Signed)
   Subjective:    Patient ID: Kerry LimberRuth S Ballew, female    DOB: 28-Mar-1940, 75 y.o.   MRN: [id number]  HPI AWV- Annual Wellness Visit  The patient was seen for their annual wellness visit. The patient's past medical history, surgical history, and family history were reviewed. Pertinent vaccines were reviewed ( tetanus, pneumonia, shingles, flu) The patient's medication list was reviewed and updated.  The height and weight were entered. The patient's current BMI is: 37.07  Cognitive screening was completed. Outcome of Mini - Cog: pass  Falls within the past 6 months: none  Current tobacco usage: none (All patients who use tobacco were given written and verbal information on quitting)  Recent listing of emergency department/hospitalizations over the past year were reviewed.  current specialist the patient sees on a regular basis: allergist once yearly   Medicare annual wellness visit patient questionnaire was reviewed.  A written screening schedule for the patient for the next 5-10 years was given. Appropriate discussion of followup regarding next visit was discussed.  Wants flu vaccine today.   Till having some cong and produ tive cough   No allergy shot ss take zyrtec daily  aggra  Alpha gal allergy ongoing. No red meat for now      Review of Systems  Constitutional: Negative for activity change, appetite change and fatigue.  HENT: Negative for congestion, ear discharge and rhinorrhea.   Eyes: Negative for discharge.  Respiratory: Negative for cough, chest tightness and wheezing.   Cardiovascular: Negative for chest pain.  Gastrointestinal: Negative for abdominal pain and vomiting.  Genitourinary: Negative for difficulty urinating and frequency.  Musculoskeletal: Negative for neck pain.  Allergic/Immunologic: Negative for environmental allergies and food allergies.  Neurological: Negative for weakness and headaches.  Psychiatric/Behavioral: Negative for agitation and  behavioral problems.  All other systems reviewed and are negative.      Objective:   Physical Exam  Constitutional: She is oriented to person, place, and time. She appears well-developed and well-nourished.  HENT:  Head: Normocephalic.  Right Ear: External ear normal.  Left Ear: External ear normal.  Eyes: Pupils are equal, round, and reactive to light.  Neck: Normal range of motion. No thyromegaly present.  Cardiovascular: Normal rate, regular rhythm, normal heart sounds and intact distal pulses.   No murmur heard. Pulmonary/Chest: Effort normal and breath sounds normal. No respiratory distress. She has no wheezes.  Abdominal: Soft. Bowel sounds are normal. She exhibits no distension and no mass. There is no tenderness.  Musculoskeletal: Normal range of motion. She exhibits no edema or tenderness.  Lymphadenopathy:    She has no cervical adenopathy.  Neurological: She is alert and oriented to person, place, and time. She exhibits normal muscle tone.  Skin: Skin is warm and dry.  Psychiatric: She has a normal mood and affect. Her behavior is normal.   Pelvic exam within normal limits. Breast exam normal       Assessment & Plan:  Impression 1 wellness exam plan appropriate blood work. Flu shot. Diet exercise discussed. Mammogram abnormality reviewed. Patient due to repeat in 6 months and plans to do this.

## 2016-03-24 ENCOUNTER — Telehealth: Payer: Self-pay | Admitting: Family Medicine

## 2016-03-24 MED ORDER — AMOXICILLIN-POT CLAVULANATE 875-125 MG PO TABS: 1.0000 | ORAL_TABLET | Freq: Two times a day (BID) | ORAL | 0 refills | Status: DC

## 2016-03-24 NOTE — Telephone Encounter (Signed)
Patient was prescribed Zpak, albuterol inhaler and benzonatate by Eber Jonesarolyn. Patient still has sore throat and coughing up yellow congestion.

## 2016-03-24 NOTE — Telephone Encounter (Signed)
Notified patient med sent to pharmacy.  

## 2016-03-24 NOTE — Telephone Encounter (Signed)
Patient seen on 02/25/16 by Eber Jonesarolyn for bacterial URI and also with Dr. Brett CanalesSteve on 03/09/16 for a physical.  She said she is still having a sore throat, especially at night, and she is still coughing up yellow stuff and would like to know what she should do.  Please advise.  [facility]

## 2016-03-24 NOTE — Telephone Encounter (Signed)
Aug 875 bid ten d 

## 2016-03-27 DIAGNOSIS — Z0000 Encounter for general adult medical examination without abnormal findings: Secondary | ICD-10-CM | POA: Diagnosis not present

## 2016-03-27 DIAGNOSIS — E785 Hyperlipidemia, unspecified: Secondary | ICD-10-CM | POA: Diagnosis not present

## 2016-03-28 LAB — HEPATIC FUNCTION PANEL
ALT: 12 IU/L (ref 0–32)
AST: 14 IU/L (ref 0–40)
Albumin: 4.1 g/dL (ref 3.5–4.8)
Alkaline Phosphatase: 89 IU/L (ref 39–117)
BILIRUBIN TOTAL: 0.2 mg/dL (ref 0.0–1.2)
Bilirubin, Direct: 0.09 mg/dL (ref 0.00–0.40)
Total Protein: 7.2 g/dL (ref 6.0–8.5)

## 2016-03-28 LAB — LIPID PANEL
CHOLESTEROL TOTAL: 259 mg/dL — AB (ref 100–199)
Chol/HDL Ratio: 3.9 ratio units (ref 0.0–4.4)
HDL: 67 mg/dL (ref >39)
LDL Calculated: 166 mg/dL — ABNORMAL HIGH (ref 0–99)
TRIGLYCERIDES: 129 mg/dL (ref 0–149)
VLDL CHOLESTEROL CAL: 26 mg/dL (ref 5–40)

## 2016-03-28 LAB — BASIC METABOLIC PANEL
BUN / CREAT RATIO: 20 (ref 12–28)
BUN: 15 mg/dL (ref 8–27)
CO2: 25 mmol/L (ref 18–29)
Calcium: 9.3 mg/dL (ref 8.7–10.3)
Chloride: 100 mmol/L (ref 96–106)
Creatinine, Ser: 0.74 mg/dL (ref 0.57–1.00)
GFR calc non Af Amer: 79 mL/min/{1.73_m2} (ref >59)
GFR, EST AFRICAN AMERICAN: 91 mL/min/{1.73_m2} (ref >59)
Glucose: 97 mg/dL (ref 65–99)
POTASSIUM: 4.6 mmol/L (ref 3.5–5.2)
Sodium: 142 mmol/L (ref 134–144)

## 2016-03-29 ENCOUNTER — Encounter: Payer: Self-pay | Admitting: Family Medicine

## 2016-04-28 ENCOUNTER — Ambulatory Visit (INDEPENDENT_AMBULATORY_CARE_PROVIDER_SITE_OTHER): Payer: Medicare Other | Admitting: Family Medicine

## 2016-04-28 ENCOUNTER — Encounter: Payer: Self-pay | Admitting: Family Medicine

## 2016-04-28 VITALS — BP 138/88 | Ht 60.5 in | Wt 199.4 lb

## 2016-04-28 DIAGNOSIS — M5431 Sciatica, right side: Secondary | ICD-10-CM | POA: Diagnosis not present

## 2016-04-28 MED ORDER — METHYLPREDNISOLONE ACETATE 40 MG/ML IJ SUSP
40.0000 mg | Freq: Once | INTRAMUSCULAR | Status: AC
  Administered 2016-04-28: 40 mg via INTRAMUSCULAR

## 2016-04-28 MED ORDER — MELOXICAM 15 MG PO TABS: 15.0000 mg | ORAL_TABLET | Freq: Every day | ORAL | 1 refills | Status: DC

## 2016-04-28 NOTE — Progress Notes (Signed)
   Subjective:    Patient ID: Vertell LimberRuth S Maund, female    DOB: Oct 30, 1940, 76 y.o.   MRN: [id number]  HPI Patient arrives with c/o riht knee pain for several days.    On further history he notes pain is deep in the right hip. Deep ache at times. Comes and goes. Radiates down into the leg and past the knee. No obvious weakness. No recent injury.  Positive history of sciatica. Positive history of deep spinal nerve injections.  Also history of bilateral arthroscopy the orthopedist on her knee challenges   Review of Systems No headache, no major weight loss or weight gain, no chest pain no back pain abdominal pain no change in bowel habits complete ROS otherwise negative     Objective:   Physical Exam  Alert vitals stable, NAD. Blood pressure good on repeat. HEENT normal. Lungs clear. Heart regular rate and rhythm. Distinct right sciatic notch tenderness plus minus straight leg raise on right. Knee good range of motion.      Assessment & Plan:  Impression flare of back and leg pain likely sciatica patient disinclined to use prednisone discussed plan Solu-Medrol injection. Meloxicam 15 mg daily symptom care discussed if results. If persists for a few weeks return for further evaluation

## 2016-04-30 DIAGNOSIS — L821 Other seborrheic keratosis: Secondary | ICD-10-CM | POA: Diagnosis not present

## 2016-04-30 DIAGNOSIS — L814 Other melanin hyperpigmentation: Secondary | ICD-10-CM | POA: Diagnosis not present

## 2016-04-30 DIAGNOSIS — L570 Actinic keratosis: Secondary | ICD-10-CM | POA: Diagnosis not present

## 2016-04-30 DIAGNOSIS — D1801 Hemangioma of skin and subcutaneous tissue: Secondary | ICD-10-CM | POA: Diagnosis not present

## 2016-04-30 DIAGNOSIS — C4441 Basal cell carcinoma of skin of scalp and neck: Secondary | ICD-10-CM | POA: Diagnosis not present

## 2016-06-15 ENCOUNTER — Other Ambulatory Visit: Payer: Self-pay | Admitting: Family Medicine

## 2016-06-23 DIAGNOSIS — C4441 Basal cell carcinoma of skin of scalp and neck: Secondary | ICD-10-CM | POA: Diagnosis not present

## 2016-07-07 ENCOUNTER — Ambulatory Visit (INDEPENDENT_AMBULATORY_CARE_PROVIDER_SITE_OTHER): Payer: Medicare Other | Admitting: Family Medicine

## 2016-07-07 ENCOUNTER — Encounter: Payer: Self-pay | Admitting: Family Medicine

## 2016-07-07 ENCOUNTER — Telehealth: Payer: Self-pay | Admitting: *Deleted

## 2016-07-07 VITALS — BP 148/80 | Ht 60.5 in | Wt 205.0 lb

## 2016-07-07 DIAGNOSIS — Z8739 Personal history of other diseases of the musculoskeletal system and connective tissue: Secondary | ICD-10-CM | POA: Diagnosis not present

## 2016-07-07 DIAGNOSIS — R29898 Other symptoms and signs involving the musculoskeletal system: Secondary | ICD-10-CM | POA: Diagnosis not present

## 2016-07-07 DIAGNOSIS — R292 Abnormal reflex: Secondary | ICD-10-CM

## 2016-07-07 DIAGNOSIS — M5430 Sciatica, unspecified side: Secondary | ICD-10-CM

## 2016-07-07 MED ORDER — MELOXICAM 15 MG PO TABS: 15.0000 mg | ORAL_TABLET | Freq: Every day | ORAL | 2 refills | Status: DC

## 2016-07-07 NOTE — Telephone Encounter (Signed)
Prescription sent electronically to pharmacy. Patient notified. 

## 2016-07-07 NOTE — Telephone Encounter (Signed)
Seen today for back pain. Wants a refill on mobic. [place] [facility]. MRI scheduled April 30th.

## 2016-07-07 NOTE — Telephone Encounter (Signed)
May ref plus 2 ref 

## 2016-07-07 NOTE — Progress Notes (Signed)
   Subjective:    Patient ID: Kerry Richardson, female    DOB: 1941/03/10, 76 y.o.   MRN: [id number]  Back Pain  Chronicity: follow up. Episode onset: over 3 months ago. The pain is present in the lumbar spine. The pain radiates to the right thigh. Treatments tried: mobic, stretches,    Pt felt somewhat better woith the mobic, but not wanting to stay on the medicine  Had injections in the past helped alm thing down considerabley  Sciatic nerve acting up  Right leg very painful extending  To foot  Leg will fel weak, both if stands for period of time   Pt not walking at all due to pain, tries to do some stretching   Pain is severe and pulling and tooth a hey  History of spinal stenosis. Has received injections in the past. History of anterolisthesis. Now bilateral thigh numbness in deep aching when standing for just a few minutes. Also severe tooth achy pain extending into right leg and foot. Notes right leg feels weak when walking or getting up out of chair Unresponsive to steroids. 3 months duration.    Review of Systems  Musculoskeletal: Positive for back pain.       Objective:   Physical Exam Alert and oriented, vitals reviewed and stable, NAD ENT-TM's and ext canals WNL bilat via otoscopic exam Soft palate, tonsils and post pharynx WNL via oropharyngeal exam Neck-symmetric, no masses; thyroid nonpalpable and nontender Pulmonary-no tachypnea or accessory muscle use; Clear without wheezes via auscultation Card--no abnrml murmurs, rhythm reg and rate WNL Carotid pulses symmetric, without bruits Positive straight leg raise right leg. Diminished sensation anterolateral foot. Diminished ankle reflex. Somewhat diminished dorsiflexion strength of great toe       Assessment & Plan:  Impression severe progressive right leg sciatica 3 months duration unresponsive to steroids. Also bilateral symptoms suggestive of worsening of patient's known spinal stenosis. Plan long discussion  held. MRI of lumbar spine. At least patient will need injections and potentially neurosurgery with symptomatology

## 2016-07-20 ENCOUNTER — Ambulatory Visit (HOSPITAL_COMMUNITY): Payer: Medicare Other

## 2016-07-20 ENCOUNTER — Ambulatory Visit (HOSPITAL_COMMUNITY)
Admission: RE | Admit: 2016-07-20 | Discharge: 2016-07-20 | Disposition: A | Discharge status: Home / Self Care | Payer: Medicare Other | Source: Ambulatory Visit | Attending: Family Medicine | Admitting: Family Medicine

## 2016-07-20 DIAGNOSIS — M5126 Other intervertebral disc displacement, lumbar region: Secondary | ICD-10-CM | POA: Diagnosis not present

## 2016-07-20 DIAGNOSIS — R292 Abnormal reflex: Secondary | ICD-10-CM | POA: Diagnosis present

## 2016-07-20 DIAGNOSIS — M7138 Other bursal cyst, other site: Secondary | ICD-10-CM | POA: Diagnosis not present

## 2016-07-20 DIAGNOSIS — M4696 Unspecified inflammatory spondylopathy, lumbar region: Secondary | ICD-10-CM | POA: Diagnosis not present

## 2016-07-20 DIAGNOSIS — M48061 Spinal stenosis, lumbar region without neurogenic claudication: Secondary | ICD-10-CM | POA: Diagnosis not present

## 2016-07-20 DIAGNOSIS — R29898 Other symptoms and signs involving the musculoskeletal system: Secondary | ICD-10-CM | POA: Diagnosis present

## 2016-07-20 DIAGNOSIS — M545 Low back pain: Secondary | ICD-10-CM | POA: Diagnosis not present

## 2016-07-20 DIAGNOSIS — M47816 Spondylosis without myelopathy or radiculopathy, lumbar region: Secondary | ICD-10-CM | POA: Insufficient documentation

## 2016-07-20 DIAGNOSIS — M5430 Sciatica, unspecified side: Secondary | ICD-10-CM | POA: Diagnosis present

## 2016-07-28 ENCOUNTER — Ambulatory Visit (INDEPENDENT_AMBULATORY_CARE_PROVIDER_SITE_OTHER): Payer: Medicare Other | Admitting: Family Medicine

## 2016-07-28 VITALS — BP 132/74 | Ht 60.05 in | Wt 204.0 lb

## 2016-07-28 DIAGNOSIS — M48062 Spinal stenosis, lumbar region with neurogenic claudication: Secondary | ICD-10-CM

## 2016-07-28 DIAGNOSIS — M4726 Other spondylosis with radiculopathy, lumbar region: Secondary | ICD-10-CM | POA: Diagnosis not present

## 2016-07-28 DIAGNOSIS — M5430 Sciatica, unspecified side: Secondary | ICD-10-CM | POA: Diagnosis not present

## 2016-07-28 NOTE — Progress Notes (Signed)
   Subjective:    Patient ID: Kerry Richardson, female    DOB: 02-Nov-1940, 76 y.o.   MRN: [id number] Patient arrives office for protracted discussion regarding her back pain symptoms along with her MRI report. HPIFollow up MRI results. Has concerns about a renal cyst that showed up on report.   Patient reports her sciatic pain has improved slightly. Still fairly severe. Still would like to take to the next step. Low back radiating into the right leg.  Patient arrives office with a copy of her MRI report and numerous requestion regarding nomenclature that she does not understand.  Also concerned that renal cysts have shown up on prior scans done by prior specialists for other reasons wonders if this needs to be taken further.  No history of acute urinary symptoms of late. No hematuria.No headache, no major weight loss or weight gain, no chest pain no back pain abdominal pain no change in bowel habits complete ROS otherwise negative      Review of Systems No headache, no major weight loss or weight gain, no chest pain no back pain abdominal pain no change in bowel habits complete ROS otherwise negative     Objective:   Physical Exam   Alert and oriented, vitals reviewed and stable, NAD ENT-TM's and ext canals WNL bilat via otoscopic exam Soft palate, tonsils and post pharynx WNL via oropharyngeal exam Neck-symmetric, no masses; thyroid nonpalpable and nontender Pulmonary-no tachypnea or accessory muscle use; Clear without wheezes via auscultation Card--no abnrml murmurs, rhythm reg and rate WNL Carotid pulses symmetric, without bruits      Assessment & Plan:  Dr Newell Coralnudelman and dr sternImpression 1 progressive neuropathic pain. Not responding to conservative measures. 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 We will work on referral towards Dr. Newell CoralNudelman Dr. Venetia MaxonStern. #2 multiple bilateral renal cysts.  Simple in nature. This particular scan did not address the kidneys only one partial cyst noted on the scan by the radiologist. Feel no further need to workup kidneys at this time rationale discussed with patient  Greater than 50% of this 25 minute face to face visit was spent in counseling and discussion and coordination of care regarding the above diagnosis/diagnosies

## 2016-08-13 DIAGNOSIS — M549 Dorsalgia, unspecified: Secondary | ICD-10-CM | POA: Diagnosis not present

## 2016-08-13 DIAGNOSIS — M4317 Spondylolisthesis, lumbosacral region: Secondary | ICD-10-CM | POA: Diagnosis not present

## 2016-08-13 DIAGNOSIS — M4726 Other spondylosis with radiculopathy, lumbar region: Secondary | ICD-10-CM | POA: Diagnosis not present

## 2016-08-13 DIAGNOSIS — M546 Pain in thoracic spine: Secondary | ICD-10-CM | POA: Diagnosis not present

## 2016-09-03 ENCOUNTER — Other Ambulatory Visit: Payer: Self-pay | Admitting: Family Medicine

## 2016-09-03 NOTE — Telephone Encounter (Signed)
Last seen 07/28/16

## 2016-11-03 ENCOUNTER — Other Ambulatory Visit: Payer: Self-pay | Admitting: Family Medicine

## 2016-11-04 NOTE — Telephone Encounter (Signed)
Last seen 07/28/16

## 2016-12-01 ENCOUNTER — Other Ambulatory Visit: Payer: Self-pay | Admitting: Family Medicine

## 2016-12-01 NOTE — Telephone Encounter (Signed)
Last seen 07/28/16 for Sciatica

## 2016-12-31 ENCOUNTER — Other Ambulatory Visit: Payer: Self-pay | Admitting: Family Medicine

## 2017-01-06 DIAGNOSIS — H40053 Ocular hypertension, bilateral: Secondary | ICD-10-CM | POA: Diagnosis not present

## 2017-01-06 DIAGNOSIS — H52223 Regular astigmatism, bilateral: Secondary | ICD-10-CM | POA: Diagnosis not present

## 2017-01-06 DIAGNOSIS — H524 Presbyopia: Secondary | ICD-10-CM | POA: Diagnosis not present

## 2017-01-06 DIAGNOSIS — H5203 Hypermetropia, bilateral: Secondary | ICD-10-CM | POA: Diagnosis not present

## 2017-01-29 ENCOUNTER — Other Ambulatory Visit: Payer: Self-pay | Admitting: Family Medicine

## 2017-01-29 NOTE — Telephone Encounter (Signed)
Last seen 07/28/16

## 2017-01-30 NOTE — Telephone Encounter (Signed)
Steve to see 

## 2017-02-08 DIAGNOSIS — H40013 Open angle with borderline findings, low risk, bilateral: Secondary | ICD-10-CM | POA: Diagnosis not present

## 2017-02-08 DIAGNOSIS — H40003 Preglaucoma, unspecified, bilateral: Secondary | ICD-10-CM | POA: Diagnosis not present

## 2017-02-08 DIAGNOSIS — H40053 Ocular hypertension, bilateral: Secondary | ICD-10-CM | POA: Diagnosis not present

## 2017-02-18 ENCOUNTER — Ambulatory Visit: Payer: Medicare Other | Admitting: Allergy & Immunology

## 2017-02-22 ENCOUNTER — Ambulatory Visit: Payer: Medicare Other | Admitting: Allergy & Immunology

## 2017-02-22 ENCOUNTER — Encounter: Payer: Self-pay | Admitting: Allergy & Immunology

## 2017-02-22 VITALS — BP 148/80 | HR 96 | Resp 20

## 2017-02-22 DIAGNOSIS — J302 Other seasonal allergic rhinitis: Secondary | ICD-10-CM | POA: Diagnosis not present

## 2017-02-22 DIAGNOSIS — T7800XD Anaphylactic reaction due to unspecified food, subsequent encounter: Secondary | ICD-10-CM

## 2017-02-22 MED ORDER — EPINEPHRINE 0.3 MG/0.3ML IJ SOAJ: INTRAMUSCULAR | 1 refills | Status: DC

## 2017-02-22 NOTE — Addendum Note (Signed)
Addended by: Bennye AlmMIRANDA, Lealer Marsland on: 02/22/2017 11:10 AM   Modules accepted: Orders

## 2017-02-22 NOTE — Progress Notes (Signed)
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 Anaphylactic shock due to food - We will get repeat alpha gal testing. - Call us when your EpiPen expires.   - We will call you in 1-2 weeks with the results.  - Unfortunately, we did not have an EpiPen samples to provide to her.   2. Seasonal allergic rhinitis - Continue with cetirizine 10mg  daily.   3. Return in about 1 year (around 02/22/2018).    Subjective:   Kerry LimberRuth S Richardson is a 76 y.o. female presenting today for follow up of  Chief Complaint  Patient presents with  . Food Intolerance    f/u doing good    Kerry Richardson has a history of the following: Patient Active Problem List   Diagnosis Date Noted  . ANAPHYLACTIC REACTION TO MAMMAL MEAT- (ALPHA-GAL) 11/30/2014  . Allergic rhinoconjunctivitis 11/30/2014  . Hyponatremia 07/14/2013  . Acute diastolic CHF (congestive heart failure) (HCC) 07/13/2013  . Hypokalemia 07/12/2013  . Unspecified essential hypertension 07/12/2013  . Hypoxemia 07/11/2013  . Acute respiratory failure (HCC) 07/11/2013  . Dyslipidemia 07/10/2013  . Acute pancreatitis 07/09/2013  . Leukocytosis 07/09/2013  . Dyspnea 07/09/2013  . S/P knee surgery 12/26/2012  . 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 Right knee pain 11/03/2012  . S/P arthroscopy of left knee 05/16/2012  . Osteoarthritis, knee 12/03/2011  . Medial meniscus, posterior horn derangement 11/11/2011  . 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 SPINAL STENOSIS 02/06/2010  . 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 OSTEOARTHRITIS, CARPOMETACARPAL JOINT, RIGHT THUMB 03/05/2008  . BURSITIS, RIGHT KNEE 03/05/2008    History obtained from: chart review and patient.  Kerry Richardson's Primary Care Provider is Merlyn AlbertLuking, William S, MD.     Kerry MouldingRuth is a 76 y.o. delightful female presenting for a  follow up visit.  She was last seen in November 2017 by Dr. Delorse LekPadgett.  She has a history of alpha gal allergy as well as allergic rhinoconjunctivitis.  She did obtain a repeat alpha gal panel  Since the last visit, she has mostly done well. There have been accidental exposures. Her last red meat exposure was years ago when she was diagnosed. EpiPen is up to date. Her part is $300.  She would like to eat red meat again.  She had a [place] burger last night and reports that it "was not the same". Allergic rhinitis symptoms are fairly well controlled with the PRN use of cetirizine. She was told to use a nasal spray at some point, but she does fine without it. She recently had a viral URI but is recovering well.   Otherwise, there have been no changes to her past medical history, surgical history, family history, or social history. 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     Review of Systems: a 14-point review of systems is pertinent for what is mentioned in HPI.  Otherwise, all other systems were negative. Constitutional: negative other than that listed in the HPI Eyes: negative other than that listed in the HPI Ears, nose, mouth, throat, and face: negative other than that listed in the HPI Respiratory: negative other than that listed in the HPI Cardiovascular: negative other than that listed in the HPI Gastrointestinal:  negative other than that listed in the HPI Genitourinary: negative other than that listed in the HPI Integument: negative other than that listed in the HPI Hematologic: negative other than that listed in the HPI Musculoskeletal: negative other than that listed in the HPI Neurological: negative other than that listed in the HPI Allergy/Immunologic: negative other than that listed in the HPI    Objective:   Blood pressure (!) 148/80, pulse 96, resp. rate  20. There is no height or weight on file to calculate BMI.   Physical Exam:  General: Alert, interactive, in no acute distress. Very pleasant female.  Eyes: No conjunctival injection bilaterally, no discharge on the right, no discharge on the left and no Horner-Trantas dots present. PERRL bilaterally. EOMI without pain. No photophobia.  Ears: Right TM pearly gray with normal light reflex, Left TM pearly gray with normal light reflex, Right TM intact without perforation and Left TM intact without perforation.  Nose/Throat: External nose within normal limits and septum midline. Turbinates minimally edematous without discharge. Posterior oropharynx mildly erythematous without cobblestoning in the posterior oropharynx. Tonsils 2+ without exudates.  Tongue without thrush. Adenopathy: no enlarged lymph nodes appreciated in the anterior cervical, occipital, axillary, epitrochlear, inguinal, or popliteal regions. Lungs: Clear to auscultation without wheezing, rhonchi or rales. No increased work of breathing. CV: Normal S1/S2. No murmurs. Capillary refill <2 seconds.  Skin: Warm and dry, without lesions or rashes. Neuro:   Grossly intact. No focal deficits appreciated. Responsive to questions.  Diagnostic studies: none     Malachi BondsJoel Eulanda Dorion, MD [facility]

## 2017-02-22 NOTE — Patient Instructions (Addendum)
1. Anaphylactic shock due to food - We will get repeat alpha gal testing. - Call us when your EpiPen expires.   - We will call you in 1-2 weeks with the results.   2. Seasonal allergic rhinitis - Continue with cetirizine 10mg  daily.   3. Return in about 1 year (around 02/22/2018).   Please inform us of any Emergency Department visits, hospitalizations, or changes in symptoms. Call us before going to the ED for breathing or allergy symptoms since we might be able to fit you in for a sick visit. Feel free to contact us anytime with any questions, problems, or concerns.  It was a pleasure to meet you today! Enjoy the holiday season!  Websites that have reliable patient information: 1. American Academy of Asthma, Allergy, and Immunology: www.aaaai.org 2. Food Allergy Research and Education (FARE): foodallergy.org 3. Mothers of Asthmatics: http://www.asthmacommunitynetwork.org 4. American College of Allergy, Asthma, and Immunology: www.acaai.org

## 2017-02-23 ENCOUNTER — Ambulatory Visit: Payer: Medicare Other | Admitting: Allergy & Immunology

## 2017-02-26 LAB — ALPHA-GAL PANEL
Alpha Gal IgE*: 1.06 kU/L — ABNORMAL HIGH (ref <0.10)
BEEF (BOS SPP) IGE: 0.25 kU/L (ref <0.35)
Class Interpretation: 0
LAMB CLASS INTERPRETATION: 0
Lamb/Mutton (Ovis spp) IgE: <0.1 kU/L (ref <0.35)
Pork (Sus spp) IgE: <0.1 kU/L (ref <0.35)

## 2017-03-06 ENCOUNTER — Other Ambulatory Visit: Payer: Self-pay | Admitting: Family Medicine

## 2017-03-11 ENCOUNTER — Encounter: Payer: Medicare Other | Admitting: Family Medicine

## 2017-03-24 ENCOUNTER — Encounter: Payer: Self-pay | Admitting: Family Medicine

## 2017-03-24 ENCOUNTER — Ambulatory Visit (INDEPENDENT_AMBULATORY_CARE_PROVIDER_SITE_OTHER): Payer: Medicare Other | Admitting: Family Medicine

## 2017-03-24 ENCOUNTER — Telehealth: Payer: Self-pay

## 2017-03-24 VITALS — BP 112/78 | Ht 60.0 in | Wt 207.0 lb

## 2017-03-24 DIAGNOSIS — Z0000 Encounter for general adult medical examination without abnormal findings: Secondary | ICD-10-CM | POA: Diagnosis not present

## 2017-03-24 DIAGNOSIS — R921 Mammographic calcification found on diagnostic imaging of breast: Secondary | ICD-10-CM

## 2017-03-24 MED ORDER — MELOXICAM 15 MG PO TABS: 15.0000 mg | ORAL_TABLET | Freq: Every day | ORAL | 11 refills | Status: DC

## 2017-03-24 NOTE — Telephone Encounter (Signed)
Dee from [phone] needs us to place an order in epic for a diagnotic mammogram to follow up on possible calcifications. Please advise.

## 2017-03-24 NOTE — Telephone Encounter (Signed)
Let's do 

## 2017-03-24 NOTE — Progress Notes (Signed)
AWV- Annual Wellness Visit  The patient was seen for their annual wellness visit. The patient's past medical history, surgical history, and family history were reviewed. Pertinent vaccines were reviewed ( tetanus, pneumonia, shingles, flu) The patient's medication list was reviewed and updated.  The height and weight were entered. The patient's current BMI is: 40.43  Cognitive screening was completed. Outcome of Mini - Cog: pass  Falls within the past 6 months: none  Current tobacco usage: none (All patients who use tobacco were given written and verbal information on quitting)  Recent listing of emergency department/hospitalizations over the past year were reviewed.  current specialist the patient sees on a regular basis: allergist - [place]   Medicare annual wellness visit patient questionnaire was reviewed.  A written screening schedule for the patient for the next 5-10 years was given. Appropriate discussion of followup regarding next visit was discussed.  Declines flu vaccine.   Ongoing pain and sciatica, judged non surgical by dr Venetia Maxonstern  Exercise not too good, with back and legs  stayng active, but no extcie  ..this  wathing diet some with fried foods   No headache, no major weight loss or weight gain, no chest pain no back pain abdominal pain no change in bowel habits complete ROS otherwise negative  Alert and oriented, vitals reviewed and stable, NAD ENT-TM's and ext canals WNL bilat via otoscopic exam Soft palate, tonsils and post pharynx WNL via oropharyngeal exam Neck-symmetric, no masses; thyroid nonpalpable and nontender Pulmonary-no tachypnea or accessory muscle use; Clear without wheezes via auscultation Card--no abnrml murmurs, rhythm reg and rate WNL Carotid pulses symmetric, without bruits Breast exam normal bilateral pelvic exam within normal limits.  No Pap smear obtained impression 1    Well exam.  Diet discussed.  Exercise discussed declined.   Up-to-date with colonoscopy.  Patient did not follow-up with her mammogram is encouraged.  We will press on this.  Diet exercise discussed.  To continue sciatica pain [phone]

## 2017-03-24 NOTE — Telephone Encounter (Signed)
I placed the order. I called Dee at [facility] and notified her.

## 2017-03-25 ENCOUNTER — Other Ambulatory Visit: Payer: Self-pay | Admitting: Family Medicine

## 2017-03-25 DIAGNOSIS — R921 Mammographic calcification found on diagnostic imaging of breast: Secondary | ICD-10-CM

## 2017-03-25 DIAGNOSIS — R928 Other abnormal and inconclusive findings on diagnostic imaging of breast: Secondary | ICD-10-CM

## 2017-03-30 ENCOUNTER — Ambulatory Visit (HOSPITAL_COMMUNITY)
Admission: RE | Admit: 2017-03-30 | Discharge: 2017-03-30 | Disposition: A | Discharge status: Home / Self Care | Payer: Medicare Other | Source: Ambulatory Visit | Attending: Family Medicine | Admitting: Family Medicine

## 2017-03-30 DIAGNOSIS — R921 Mammographic calcification found on diagnostic imaging of breast: Secondary | ICD-10-CM | POA: Insufficient documentation

## 2017-04-28 ENCOUNTER — Encounter: Payer: Self-pay | Admitting: Allergy & Immunology

## 2017-04-28 ENCOUNTER — Ambulatory Visit: Payer: Medicare Other | Admitting: Allergy & Immunology

## 2017-04-28 VITALS — BP 140/82 | HR 66 | Ht 60.0 in | Wt 207.0 lb

## 2017-04-28 DIAGNOSIS — T7800XD Anaphylactic reaction due to unspecified food, subsequent encounter: Secondary | ICD-10-CM

## 2017-04-28 NOTE — Progress Notes (Addendum)
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 Anaphylactic shock due to food - You tolerated your beef challenge today. - There is still a chance you could react over the next hour or so.  - Give us a call if you have any problems. - However, if you do well tonight, I would recommend introducing beef back into your diet.   2. Seasonal allergic rhinitis - Continue with cetirizine 10mg  daily.   3. Return in about 1 year (around 04/28/2018).   Subjective:   Kerry Richardson is a 77 y.o. female presenting today for follow up of  Chief Complaint  Patient presents with  . Food/Drug Challenge    Pt presents for beef challenge.    Kerry Richardson has a history of the following: Patient Active Problem List   Diagnosis Date Noted  . Seasonal allergic rhinitis 02/22/2017  . ANAPHYLACTIC REACTION TO MAMMAL MEAT- (ALPHA-GAL) 11/30/2014  . Allergic rhinoconjunctivitis 11/30/2014  . Hyponatremia 07/14/2013  . Acute diastolic CHF (congestive heart failure) (HCC) 07/13/2013  . Hypokalemia 07/12/2013  . Unspecified essential hypertension 07/12/2013  . Hypoxemia 07/11/2013  . Acute respiratory failure (HCC) 07/11/2013  . Dyslipidemia 07/10/2013  . Acute pancreatitis 07/09/2013  . Leukocytosis 07/09/2013  . Dyspnea 07/09/2013  . S/P knee surgery 12/26/2012  . 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 Right knee pain 11/03/2012  . S/P arthroscopy of left knee 05/16/2012  . Osteoarthritis, knee 12/03/2011  . Medial meniscus, posterior horn derangement 11/11/2011  . 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 SPINAL STENOSIS 02/06/2010  . 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 OSTEOARTHRITIS, CARPOMETACARPAL JOINT, RIGHT THUMB 03/05/2008  . BURSITIS, RIGHT KNEE 03/05/2008    History obtained from: chart review and patient.  Jerilee Hohuth S Blanchfield's Primary Care Provider is  Merlyn AlbertLuking, William S, MD.     Kerry Richardson is a 77 y.o. female presenting for a beef challenge. She has a history of alpha gal sensitivity. She was last seen in December 2018 for retesting for alpha gal. At that time, we did obtain an alpha gal panel that showed negative IgE to beef, pork, and lamb. The alpha-gal IgE was 1.06, which was decreased from her initial IgE level of 4.24. Given the decrease, we decided to bring her in for a food challenge.   Since the last visit, she has done well. She reports today that she is "hungry" and is looking forward to her challenge today. She brought in a couple of grilled hamburgers today.   Otherwise, there have been no changes to her past medical history, surgical history, family history, or social history.    Review of Systems: a 14-point review of systems is pertinent for what is mentioned in HPI.  Otherwise, all other systems were negative. Constitutional: negative other than that listed in the HPI Eyes: negative other than that listed in the HPI Ears, nose, mouth, throat, and face: negative other than that listed in the HPI Respiratory: negative other than that listed in the HPI Cardiovascular: negative other than that listed in the HPI Gastrointestinal: negative other than that listed in the HPI Genitourinary: negative other than that listed in the HPI Integument: negative other than that listed in the HPI Hematologic: negative other than that listed in the HPI Musculoskeletal: negative other than that listed in the HPI Neurological: negative other than that listed in the HPI Allergy/Immunologic: negative other than that listed  in the HPI    Objective:   Blood pressure 140/82, pulse 66, height 5' (1.524 m), weight 207 lb (93.9 kg), SpO2 98 %. Body mass index is 40.43 kg/m.   Physical Exam: deferred since this was an oral ingestion challenge appointment only   We gave half of a hamburger in clinic and monitored her for six hours. We took vitals  every 30 minutes. All vitals remained within normal limits.       Malachi Bonds, MD FAAAAI [facility]

## 2017-04-28 NOTE — Patient Instructions (Addendum)
1. Anaphylactic shock due to food - You tolerated your beef challenge today. - There is still a chance you could react over the next hour or so.  - Give us a call if you have any problems. - However, if you do well tonight, I would recommend introducing beef back into your diet.   2. Seasonal allergic rhinitis - Continue with cetirizine 10mg  daily.   3. Return in about 1 year (around 04/28/2018).    Please inform us of any Emergency Department visits, hospitalizations, or changes in symptoms. Call us before going to the ED for breathing or allergy symptoms since we might be able to fit you in for a sick visit. Feel free to contact us anytime with any questions, problems, or concerns.  It was a pleasure to see you again today! Happy New Year!   Websites that have reliable patient information: 1. American Academy of Asthma, Allergy, and Immunology: www.aaaai.org 2. Food Allergy Research and Education (FARE): foodallergy.org 3. Mothers of Asthmatics: http://www.asthmacommunitynetwork.org 4. American College of Allergy, Asthma, and Immunology: www.acaai.org

## 2017-05-03 DIAGNOSIS — H40013 Open angle with borderline findings, low risk, bilateral: Secondary | ICD-10-CM | POA: Diagnosis not present

## 2017-05-03 DIAGNOSIS — H40053 Ocular hypertension, bilateral: Secondary | ICD-10-CM | POA: Diagnosis not present

## 2017-05-03 DIAGNOSIS — H40003 Preglaucoma, unspecified, bilateral: Secondary | ICD-10-CM | POA: Diagnosis not present

## 2017-07-05 ENCOUNTER — Other Ambulatory Visit: Payer: Self-pay | Admitting: Family Medicine

## 2017-07-19 DIAGNOSIS — H401134 Primary open-angle glaucoma, bilateral, indeterminate stage: Secondary | ICD-10-CM | POA: Diagnosis not present

## 2017-07-19 DIAGNOSIS — H26492 Other secondary cataract, left eye: Secondary | ICD-10-CM | POA: Diagnosis not present

## 2017-07-19 DIAGNOSIS — H25811 Combined forms of age-related cataract, right eye: Secondary | ICD-10-CM | POA: Diagnosis not present

## 2017-07-22 NOTE — Patient Instructions (Signed)
Your procedure is scheduled on: 08/02/2017  Report to [facility] at  800   AM.  Call this number if you have problems the morning of surgery: [phone]   Do not eat food or drink liquids :After Midnight.      Take these medicines the morning of surgery with A SIP OF WATER: Mobic.   Do not wear jewelry, make-up or nail polish.  Do not wear lotions, powders, or perfumes. You may wear deodorant.  Do not shave 48 hours prior to surgery.  Do not bring valuables to the hospital.  Contacts, dentures or bridgework may not be worn into surgery.  Leave suitcase in the car. After surgery it may be brought to your room.  For patients admitted to the hospital, checkout time is 11:00 AM the day of discharge.   Patients discharged the day of surgery will not be allowed to drive home.  :     Please read over the following fact sheets that you were given: Coughing and Deep Breathing, Surgical Site Infection Prevention, Anesthesia Post-op Instructions and Care and Recovery After Surgery    Cataract A cataract is a clouding of the lens of the eye. When a lens becomes cloudy, vision is reduced based on the degree and nature of the clouding. Many cataracts reduce vision to some degree. Some cataracts make people more near-sighted as they develop. Other cataracts increase glare. Cataracts that are ignored and become worse can sometimes look white. The white color can be seen through the pupil. CAUSES   Aging. However, cataracts may occur at any age, even in newborns.   Certain drugs.   Trauma to the eye.   Certain diseases such as diabetes.   Specific eye diseases such as chronic inflammation inside the eye or a sudden attack of a rare form of glaucoma.   Inherited or acquired medical problems.  SYMPTOMS   Gradual, progressive drop in vision in the affected eye.   Severe, rapid visual loss. This most often happens when trauma is the cause.  DIAGNOSIS  To detect a cataract, an eye doctor examines  the lens. Cataracts are best diagnosed with an exam of the eyes with the pupils enlarged (dilated) by drops.  TREATMENT  For an early cataract, vision may improve by using different eyeglasses or stronger lighting. If that does not help your vision, surgery is the only effective treatment. A cataract needs to be surgically removed when vision loss interferes with your everyday activities, such as driving, reading, or watching TV. A cataract may also have to be removed if it prevents examination or treatment of another eye problem. Surgery removes the cloudy lens and usually replaces it with a substitute lens (intraocular lens, IOL).  At a time when both you and your doctor agree, the cataract will be surgically removed. If you have cataracts in both eyes, only one is usually removed at a time. This allows the operated eye to heal and be out of danger from any possible problems after surgery (such as infection or poor wound healing). In rare cases, a cataract may be doing damage to your eye. In these cases, your caregiver may advise surgical removal right away. The vast majority of people who have cataract surgery have better vision afterward. HOME CARE INSTRUCTIONS  If you are not planning surgery, you may be asked to do the following:  Use different eyeglasses.   Use stronger or brighter lighting.   Ask your eye doctor about reducing your medicine dose  or changing medicines if it is thought that a medicine caused your cataract. Changing medicines does not make the cataract go away on its own.   Become familiar with your surroundings. Poor vision can lead to injury. Avoid bumping into things on the affected side. You are at a higher risk for tripping or falling.   Exercise extreme care when driving or operating machinery.   Wear sunglasses if you are sensitive to bright light or experiencing problems with glare.  SEEK IMMEDIATE MEDICAL CARE IF:   You have a worsening or sudden vision loss.    You notice redness, swelling, or increasing pain in the eye.   You have a fever.  Document Released: 03/09/2005 Document Revised: 02/26/2011 Document Reviewed: 10/31/2010 [facility] Patient Information 2012 [place].PATIENT INSTRUCTIONS POST-ANESTHESIA  IMMEDIATELY FOLLOWING SURGERY:  Do not drive or operate machinery for the first twenty four hours after surgery.  Do not make any important decisions for twenty four hours after surgery or while taking narcotic pain medications or sedatives.  If you develop intractable nausea and vomiting or a severe headache please notify your doctor immediately.  FOLLOW-UP:  Please make an appointment with your surgeon as instructed. You do not need to follow up with anesthesia unless specifically instructed to do so.  WOUND CARE INSTRUCTIONS (if applicable):  Keep a dry clean dressing on the anesthesia/puncture wound site if there is drainage.  Once the wound has quit draining you may leave it open to air.  Generally you should leave the bandage intact for twenty four hours unless there is drainage.  If the epidural site drains for more than 36-48 hours please call the anesthesia department.  QUESTIONS?:  Please feel free to call your physician or the hospital operator if you have any questions, and they will be happy to assist you.

## 2017-07-26 ENCOUNTER — Encounter (HOSPITAL_COMMUNITY): Payer: Self-pay

## 2017-07-26 ENCOUNTER — Encounter (HOSPITAL_COMMUNITY)
Admission: RE | Admit: 2017-07-26 | Discharge: 2017-07-26 | Disposition: A | Discharge status: Home / Self Care | Payer: Medicare Other | Source: Ambulatory Visit | Attending: Ophthalmology | Admitting: Ophthalmology

## 2017-07-26 ENCOUNTER — Other Ambulatory Visit: Payer: Self-pay

## 2017-07-26 DIAGNOSIS — Z01812 Encounter for preprocedural laboratory examination: Secondary | ICD-10-CM | POA: Insufficient documentation

## 2017-07-26 DIAGNOSIS — Z01810 Encounter for preprocedural cardiovascular examination: Secondary | ICD-10-CM | POA: Diagnosis not present

## 2017-07-26 HISTORY — DX: Dyspnea, unspecified: R06.00

## 2017-07-26 LAB — BASIC METABOLIC PANEL
Anion gap: 10 (ref 5–15)
BUN: 18 mg/dL (ref 6–20)
CALCIUM: 9 mg/dL (ref 8.9–10.3)
CHLORIDE: 101 mmol/L (ref 101–111)
CO2: 26 mmol/L (ref 22–32)
Creatinine, Ser: 0.66 mg/dL (ref 0.44–1.00)
GFR calc non Af Amer: >60 mL/min (ref >60)
GLUCOSE: 80 mg/dL (ref 65–99)
Potassium: 3.9 mmol/L (ref 3.5–5.1)
Sodium: 137 mmol/L (ref 135–145)

## 2017-07-26 LAB — CBC
HCT: 39.9 % (ref 36.0–46.0)
HEMOGLOBIN: 12.7 g/dL (ref 12.0–15.0)
MCH: 28.7 pg (ref 26.0–34.0)
MCHC: 31.8 g/dL (ref 30.0–36.0)
MCV: 90.1 fL (ref 78.0–100.0)
Platelets: 293 10*3/uL (ref 150–400)
RBC: 4.43 MIL/uL (ref 3.87–5.11)
RDW: 14.6 % (ref 11.5–15.5)
WBC: 9.4 10*3/uL (ref 4.0–10.5)

## 2017-07-29 DIAGNOSIS — H25811 Combined forms of age-related cataract, right eye: Secondary | ICD-10-CM | POA: Diagnosis not present

## 2017-08-02 ENCOUNTER — Encounter (HOSPITAL_COMMUNITY)
Admission: RE | Disposition: A | Discharge status: Home / Self Care | Payer: Self-pay | Source: Ambulatory Visit | Attending: Ophthalmology

## 2017-08-02 ENCOUNTER — Encounter (HOSPITAL_COMMUNITY): Payer: Self-pay | Admitting: *Deleted

## 2017-08-02 ENCOUNTER — Ambulatory Visit (HOSPITAL_COMMUNITY)
Admission: RE | Admit: 2017-08-02 | Discharge: 2017-08-02 | Disposition: A | Discharge status: Home / Self Care | Payer: Medicare Other | Source: Ambulatory Visit | Attending: Ophthalmology | Admitting: Ophthalmology

## 2017-08-02 ENCOUNTER — Ambulatory Visit (HOSPITAL_COMMUNITY): Payer: Medicare Other | Admitting: Anesthesiology

## 2017-08-02 DIAGNOSIS — H2511 Age-related nuclear cataract, right eye: Secondary | ICD-10-CM | POA: Diagnosis not present

## 2017-08-02 DIAGNOSIS — K219 Gastro-esophageal reflux disease without esophagitis: Secondary | ICD-10-CM | POA: Diagnosis not present

## 2017-08-02 DIAGNOSIS — I110 Hypertensive heart disease with heart failure: Secondary | ICD-10-CM | POA: Insufficient documentation

## 2017-08-02 DIAGNOSIS — Z79899 Other long term (current) drug therapy: Secondary | ICD-10-CM | POA: Insufficient documentation

## 2017-08-02 DIAGNOSIS — I509 Heart failure, unspecified: Secondary | ICD-10-CM | POA: Diagnosis not present

## 2017-08-02 DIAGNOSIS — H25811 Combined forms of age-related cataract, right eye: Secondary | ICD-10-CM | POA: Insufficient documentation

## 2017-08-02 DIAGNOSIS — Z791 Long term (current) use of non-steroidal anti-inflammatories (NSAID): Secondary | ICD-10-CM | POA: Diagnosis not present

## 2017-08-02 DIAGNOSIS — H4010X2 Unspecified open-angle glaucoma, moderate stage: Secondary | ICD-10-CM | POA: Insufficient documentation

## 2017-08-02 DIAGNOSIS — I10 Essential (primary) hypertension: Secondary | ICD-10-CM | POA: Diagnosis not present

## 2017-08-02 HISTORY — PX: CATARACT EXTRACTION W/PHACO: SHX586

## 2017-08-02 SURGERY — PHACOEMULSIFICATION, CATARACT, WITH IOL INSERTION
Anesthesia: Monitor Anesthesia Care | Site: Eye | Laterality: Right

## 2017-08-02 MED ORDER — MIDAZOLAM HCL 2 MG/2ML IJ SOLN
INTRAMUSCULAR | Status: AC
  Filled 2017-08-02: qty 2

## 2017-08-02 MED ORDER — CYCLOPENTOLATE-PHENYLEPHRINE 0.2-1 % OP SOLN
1.0000 [drp] | OPHTHALMIC | Status: AC
  Administered 2017-08-02 (×3): 1 [drp] via OPHTHALMIC

## 2017-08-02 MED ORDER — LIDOCAINE HCL (PF) 1 % IJ SOLN
INTRAMUSCULAR | Status: DC | PRN
  Administered 2017-08-02: .4 mL

## 2017-08-02 MED ORDER — LACTATED RINGERS IV SOLN
INTRAVENOUS | Status: DC
  Administered 2017-08-02: 1000 mL via INTRAVENOUS

## 2017-08-02 MED ORDER — BSS IO SOLN
INTRAOCULAR | Status: DC | PRN
  Administered 2017-08-02: 15 mL

## 2017-08-02 MED ORDER — TETRACAINE HCL 0.5 % OP SOLN
1.0000 [drp] | OPHTHALMIC | Status: AC
  Administered 2017-08-02 (×3): 1 [drp] via OPHTHALMIC

## 2017-08-02 MED ORDER — POVIDONE-IODINE 5 % OP SOLN
OPHTHALMIC | Status: DC | PRN
  Administered 2017-08-02: 1 via OPHTHALMIC

## 2017-08-02 MED ORDER — EPINEPHRINE PF 1 MG/ML IJ SOLN
INTRAMUSCULAR | Status: DC | PRN
  Administered 2017-08-02: 500 mL

## 2017-08-02 MED ORDER — LIDOCAINE HCL 3.5 % OP GEL
1.0000 "application " | Freq: Once | OPHTHALMIC | Status: AC
  Administered 2017-08-02: 1 via OPHTHALMIC

## 2017-08-02 MED ORDER — PHENYLEPHRINE HCL 2.5 % OP SOLN
1.0000 [drp] | OPHTHALMIC | Status: AC
  Administered 2017-08-02 (×3): 1 [drp] via OPHTHALMIC

## 2017-08-02 MED ORDER — EPINEPHRINE PF 1 MG/ML IJ SOLN
INTRAMUSCULAR | Status: AC
  Filled 2017-08-02: qty 1

## 2017-08-02 MED ORDER — PROVISC 10 MG/ML IO SOLN
INTRAOCULAR | Status: DC | PRN
  Administered 2017-08-02 (×2): 0.85 mL via INTRAOCULAR

## 2017-08-02 MED ORDER — NEOMYCIN-POLYMYXIN-DEXAMETH 3.5-10000-0.1 OP SUSP
OPHTHALMIC | Status: DC | PRN
  Administered 2017-08-02: 2 [drp] via OPHTHALMIC

## 2017-08-02 MED ORDER — MIDAZOLAM HCL 2 MG/2ML IJ SOLN
INTRAMUSCULAR | Status: DC | PRN
  Administered 2017-08-02 (×2): 1 mg via INTRAVENOUS

## 2017-08-02 SURGICAL SUPPLY — 13 items
CLOTH BEACON ORANGE TIMEOUT ST (SAFETY) ×1 IMPLANT
DEVICE INJECT ISTENT W (Stent) IMPLANT
EYE SHIELD UNIVERSAL CLEAR (GAUZE/BANDAGES/DRESSINGS) ×1 IMPLANT
GLOVE BIOGEL PI IND STRL 7.0 (GLOVE) IMPLANT
GLOVE BIOGEL PI INDICATOR 7.0 (GLOVE) ×2
GLOVE EXAM NITRILE LRG STRL (GLOVE) ×1 IMPLANT
INJECT ISTENT W (Stent) ×2 IMPLANT
LENS ALC ACRYL/TECN (Ophthalmic Related) ×1 IMPLANT
PAD ARMBOARD 7.5X6 YLW CONV (MISCELLANEOUS) ×1 IMPLANT
SYRINGE LUER LOK 1CC (MISCELLANEOUS) ×1 IMPLANT
TAPE SURG TRANSPORE 1 IN (GAUZE/BANDAGES/DRESSINGS) IMPLANT
TAPE SURGICAL TRANSPORE 1 IN (GAUZE/BANDAGES/DRESSINGS) ×1
WATER STERILE IRR 250ML POUR (IV SOLUTION) ×1 IMPLANT

## 2017-08-02 NOTE — Transfer of Care (Signed)
Immediate Anesthesia Transfer of Care Note  Patient: Kerry Richardson  Procedure(s) Performed: CATARACT EXTRACTION WITH PHACOEMULSIFICATION  AND INTRAOCULAR LENS PLACEMENT AND PLACEMENT OF iSTENT GLAUCOMA DEVICE RIGHT EYE (Right Eye)  Patient Location: Short Stay  Anesthesia Type:MAC  Level of Consciousness: awake, alert  and patient cooperative  Airway & Oxygen Therapy: Patient Spontanous Breathing  Post-op Assessment: Report given to RN and Post -op Vital signs reviewed and stable  Post vital signs: Reviewed and stable  Last Vitals:  Vitals Value Taken Time  BP    Temp    Pulse    Resp    SpO2      Last Pain:  Vitals:   08/02/17 1058  TempSrc: Oral  PainSc: 0-No pain      Patients Stated Pain Goal: 7 (56/86/16 [id number])  Complications: No apparent anesthesia complications

## 2017-08-02 NOTE — Anesthesia Preprocedure Evaluation (Signed)
Anesthesia Evaluation  Patient identified by MRN, date of birth, ID band Patient awake    Reviewed: Allergy & Precautions, H&P , NPO status , Patient's Chart, lab work & pertinent test results, reviewed documented beta blocker date and time   History of Anesthesia Complications (+) PONV and history of anesthetic complications  Airway Mallampati: II  TM Distance: >3 FB Neck ROM: full    Dental no notable dental hx.    Pulmonary neg pulmonary ROS, shortness of breath,    Pulmonary exam normal breath sounds clear to auscultation       Cardiovascular Exercise Tolerance: Good hypertension, +CHF  negative cardio ROS   Rhythm:regular Rate:Normal     Neuro/Psych  Headaches,  Neuromuscular disease negative neurological ROS  negative psych ROS   GI/Hepatic negative GI ROS, Neg liver ROS, GERD  ,  Endo/Other  negative endocrine ROS  Renal/GU negative Renal ROS  negative genitourinary   Musculoskeletal negative musculoskeletal ROS (+) Arthritis ,   Abdominal   Peds negative pediatric ROS (+)  Hematology negative hematology ROS (+)   Anesthesia Other Findings   Reproductive/Obstetrics negative OB ROS                             Anesthesia Physical Anesthesia Plan  ASA: III  Anesthesia Plan: MAC   Post-op Pain Management:    Induction:   PONV Risk Score and Plan:   Airway Management Planned:   Additional Equipment:   Intra-op Plan:   Post-operative Plan:   Informed Consent: I have reviewed the patients History and Physical, chart, labs and discussed the procedure including the risks, benefits and alternatives for the proposed anesthesia with the patient or authorized representative who has indicated his/her understanding and acceptance.   Dental Advisory Given  Plan Discussed with: CRNA  Anesthesia Plan Comments:         Anesthesia Quick Evaluation

## 2017-08-02 NOTE — Anesthesia Postprocedure Evaluation (Signed)
Anesthesia Post Note  Patient: Kerry Richardson  Procedure(s) Performed: CATARACT EXTRACTION WITH PHACOEMULSIFICATION  AND INTRAOCULAR LENS PLACEMENT AND PLACEMENT OF iSTENT GLAUCOMA DEVICE RIGHT EYE (Right Eye)  Patient location during evaluation: Short Stay Anesthesia Type: MAC Level of consciousness: awake and alert and patient cooperative Pain management: satisfactory to patient Vital Signs Assessment: post-procedure vital signs reviewed and stable Respiratory status: spontaneous breathing Cardiovascular status: stable Postop Assessment: no apparent nausea or vomiting Anesthetic complications: no     Last Vitals:  Vitals:   08/02/17 1120 08/02/17 1125  BP:    Pulse:    Resp: (!) 25 20  Temp:    SpO2: 99% 99%    Last Pain:  Vitals:   08/02/17 1058  TempSrc: Oral  PainSc: 0-No pain                 Mercie Balsley

## 2017-08-02 NOTE — H&P (Signed)
I have reviewed the H&P, the patient was re-examined, and I have identified no interval changes in medical condition and plan of care since the history and physical of record  

## 2017-08-02 NOTE — Op Note (Signed)
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  Assistants: None  Anesthesia: Topical with MAC  Indications: Painless, progressive loss of vision with compromise of daily activities.  Surgery: Cataract Extraction with Intraocular lens Implant Right Eye  Discription: The patient had dilating drops and viscous lidocaine placed into the Right eye in the pre-op holding area. After transfer to the operating room, a time out was performed. The patient was then prepped and draped. Beginning with a 96m blade a paracentesis port was made at the surgeon's 2 o'clock position. The anterior chamber was then filled with 1% non-preserved lidocaine. This was followed by filling the anterior chamber with Provisc.  A 2.447mkeratome blade was used to make a clear corneal incision at the temporal limbus.  A bent cystatome needle was used to create a continuous tear capsulotomy. Hydrodissection was performed with balanced salt solution on a Fine canula. The lens nucleus was then removed using the phacoemulsification handpiece. Residual cortex was removed with the I&A handpiece.    The anterior chamber and capsular bag were refilled with Lidocaine and then Provisc. A posterior chamber intraocular lens was placed into the capsular bag with it's injector. The implant was positioned with the Kuglan hook.   The patient's head was rotated 30 degrees away from the surgeon and the microscope rotated 30 degrees toward the surgeon. Provisc was placed on the cornea followed by the gonio prism. After the angle was visualized and (2) iStent Injects were placed into the nasal trabecular meshwork.  The Provisc was then removed from the anterior chamber and capsular bag with the I&A handpiece. Stromal hydration of the  main incision and paracentesis port was performed with BSS on a Fine canula. The wounds were tested for leak which was negative. The patient tolerated the procedure well. There were no operative complications. The patient was then transferred to the recovery room in stable condition.  Complications: None  Specimen: None  EBL: None  Prosthetic device:  1) [employer] Technis, PCB00, power 20.0, SN [id number]) GlDe MotteG2-M-IS, [id number]

## 2017-08-03 ENCOUNTER — Encounter (HOSPITAL_COMMUNITY): Payer: Self-pay | Admitting: Ophthalmology

## 2017-09-20 DIAGNOSIS — H401132 Primary open-angle glaucoma, bilateral, moderate stage: Secondary | ICD-10-CM | POA: Diagnosis not present

## 2017-09-20 DIAGNOSIS — Z961 Presence of intraocular lens: Secondary | ICD-10-CM | POA: Diagnosis not present

## 2017-12-21 DIAGNOSIS — L570 Actinic keratosis: Secondary | ICD-10-CM | POA: Diagnosis not present

## 2017-12-21 DIAGNOSIS — C44622 Squamous cell carcinoma of skin of right upper limb, including shoulder: Secondary | ICD-10-CM | POA: Diagnosis not present

## 2017-12-21 DIAGNOSIS — D485 Neoplasm of uncertain behavior of skin: Secondary | ICD-10-CM | POA: Diagnosis not present

## 2017-12-21 DIAGNOSIS — L821 Other seborrheic keratosis: Secondary | ICD-10-CM | POA: Diagnosis not present

## 2017-12-21 DIAGNOSIS — D1801 Hemangioma of skin and subcutaneous tissue: Secondary | ICD-10-CM | POA: Diagnosis not present

## 2017-12-23 DIAGNOSIS — H401132 Primary open-angle glaucoma, bilateral, moderate stage: Secondary | ICD-10-CM | POA: Diagnosis not present

## 2018-01-21 ENCOUNTER — Other Ambulatory Visit: Payer: Self-pay | Admitting: Family Medicine

## 2018-02-24 ENCOUNTER — Encounter: Payer: Self-pay | Admitting: Family Medicine

## 2018-02-24 ENCOUNTER — Ambulatory Visit: Payer: Medicare Other | Admitting: Family Medicine

## 2018-02-24 VITALS — BP 156/84 | HR 86 | Temp 99.2°F | Ht 60.0 in | Wt 209.0 lb

## 2018-02-24 DIAGNOSIS — J40 Bronchitis, not specified as acute or chronic: Secondary | ICD-10-CM | POA: Diagnosis not present

## 2018-02-24 MED ORDER — ALBUTEROL SULFATE HFA 108 (90 BASE) MCG/ACT IN AERS: 2.0000 | INHALATION_SPRAY | Freq: Four times a day (QID) | RESPIRATORY_TRACT | 0 refills | Status: DC | PRN

## 2018-02-24 MED ORDER — BENZONATATE 100 MG PO CAPS: 100.0000 mg | ORAL_CAPSULE | Freq: Two times a day (BID) | ORAL | 0 refills | Status: DC | PRN

## 2018-02-24 NOTE — Progress Notes (Signed)
   Subjective:    Patient ID: Kerry Richardson, female    DOB: 1940-06-17, 77 y.o.   MRN: [id number]  HPI Patient is here today with cough ,wheezing ,runny nose/stopped up nose.Some shortness of breath with the cough, sore ribs from coughing. This has been ongoing for a week. She has taken sudafed and tessalon.  1 week ago started with scratchy throat and then more URI symptoms. 4 days ago started with increasing cough and wheeze. Cough productive of thick white mucous or yellow phlegm, worse at night, also with shortness of breath.  Reports some chills and sweats off and on.    Review of Systems  Constitutional: Positive for chills. Negative for fever.  HENT: Positive for rhinorrhea. Negative for congestion, ear pain and sore throat.   Eyes: Negative for discharge.  Respiratory: Positive for cough and wheezing.        Objective:   Physical Exam  Constitutional: She is oriented to person, place, and time. She appears well-developed and well-nourished. No distress.  HENT:  Head: Normocephalic and atraumatic.  Right Ear: Tympanic membrane normal.  Left Ear: Tympanic membrane normal.  Nose: Nose normal. No sinus tenderness.  Mouth/Throat: Uvula is midline and oropharynx is clear and moist.  Eyes: Right eye exhibits no discharge. Left eye exhibits no discharge.  Neck: Neck supple.  Cardiovascular: Normal rate, regular rhythm and normal heart sounds.  Pulmonary/Chest: Effort normal. No respiratory distress. She has wheezes (occasional expiratory wheeze ).  Lymphadenopathy:    She has no cervical adenopathy.  Neurological: She is alert and oriented to person, place, and time.  Skin: Skin is warm and dry.  Nursing note and vitals reviewed.         Assessment & Plan:  Bronchitis Discussed likely viral etiology at this time. Abx not indicated at this time. No need for chest x-ray or lab work today. Recommend symptomatic treatment. May take tessalon perles and albuterol inhaler prn.  Warning signs discussed, will f/u if symptoms worsen or fail to improve.   Dr. Lilyan PuntScott Luking was consulted on this case and is in agreement with the above treatment plan.

## 2018-03-24 IMAGING — MG 2D DIGITAL SCREENING BILATERAL MAMMOGRAM WITH CAD AND ADJUNCT TO
8 series · 8 of 24 positions shown · non-contrast
Comparison: Previous exam(s).

CLINICAL DATA: Screening.

EXAM:
2D DIGITAL SCREENING BILATERAL MAMMOGRAM WITH CAD AND ADJUNCT TOMO

[R MLO]
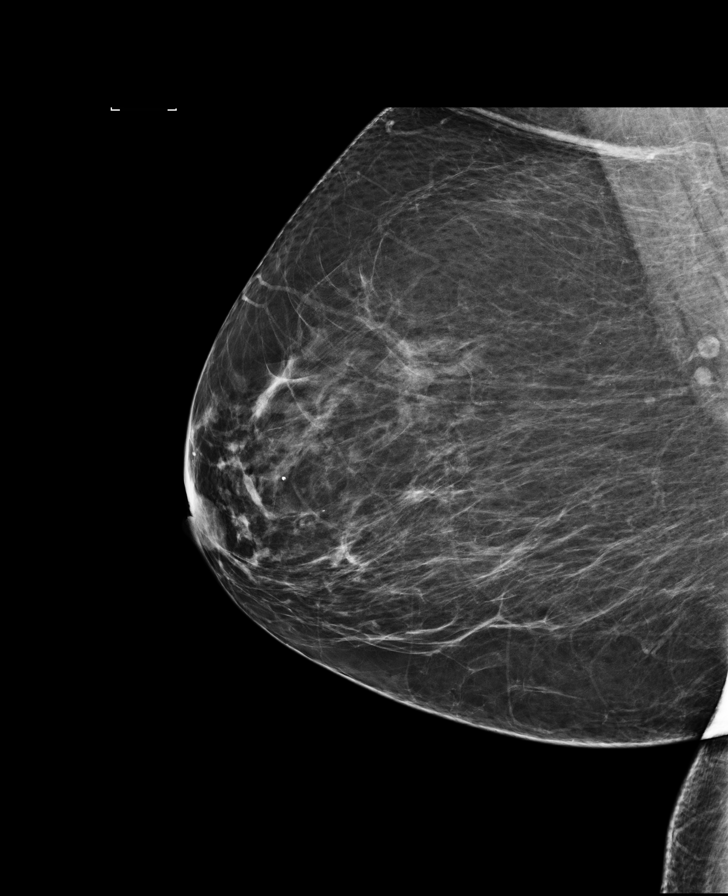

[L CC]
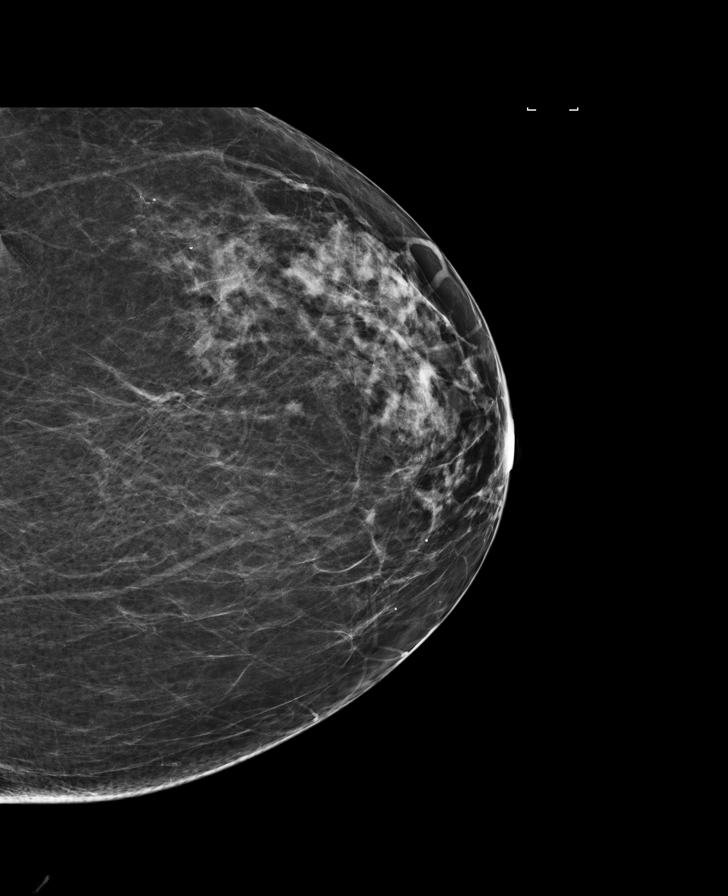

[R CC]
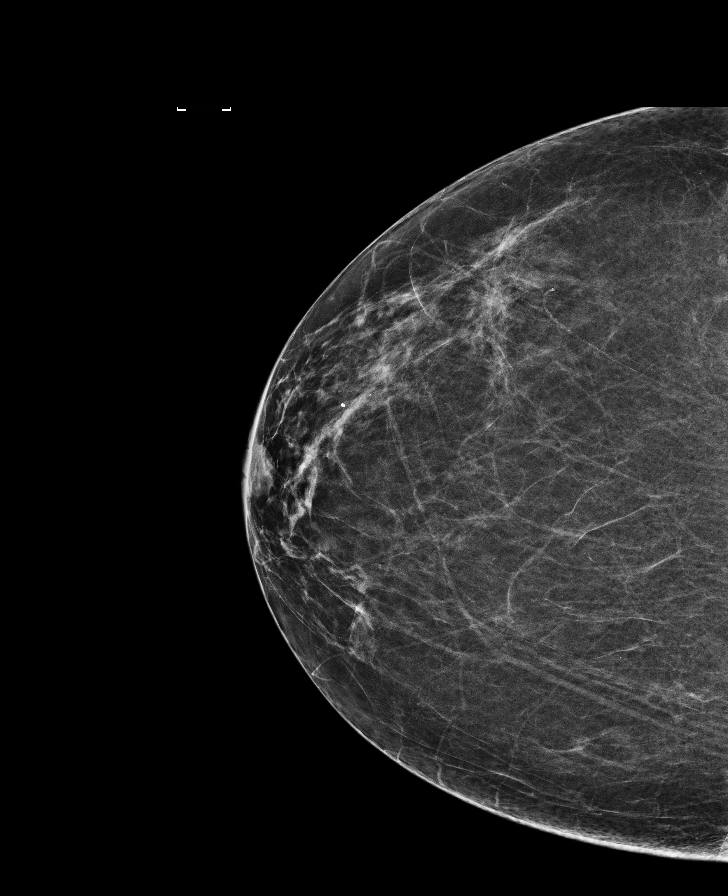

[L MLO]
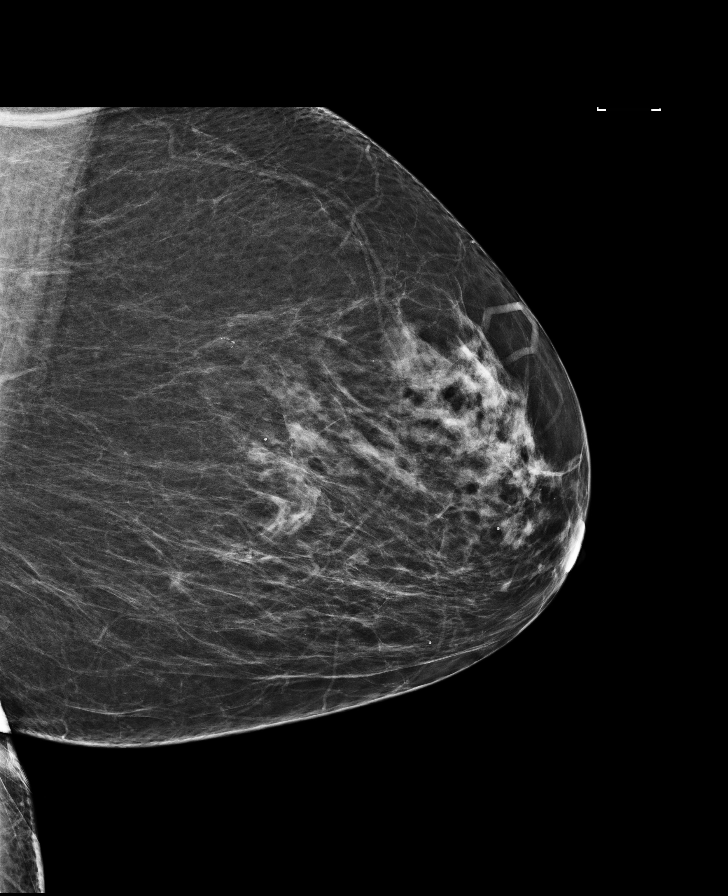

[L MLO tomo · tomo slice 39/76.0]
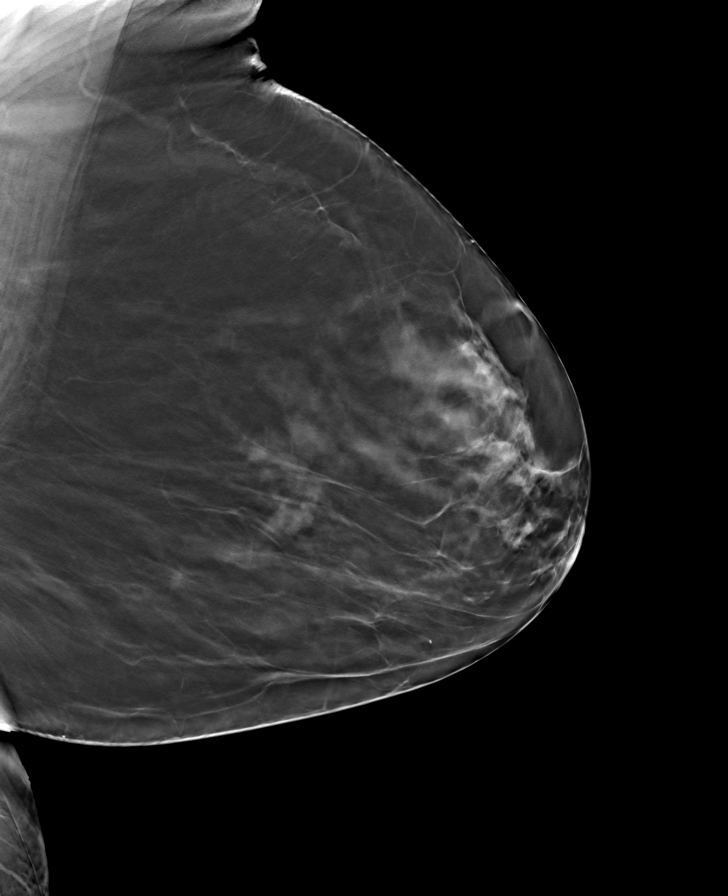

[R MLO tomo · tomo slice 41/81.0]
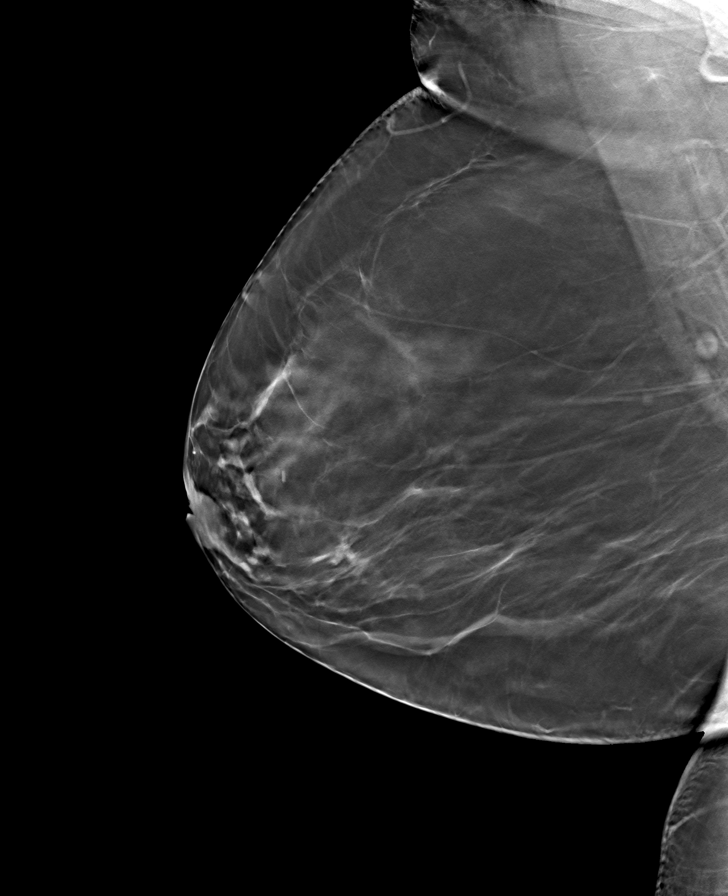

[R CC tomo · tomo slice 35/69.0]
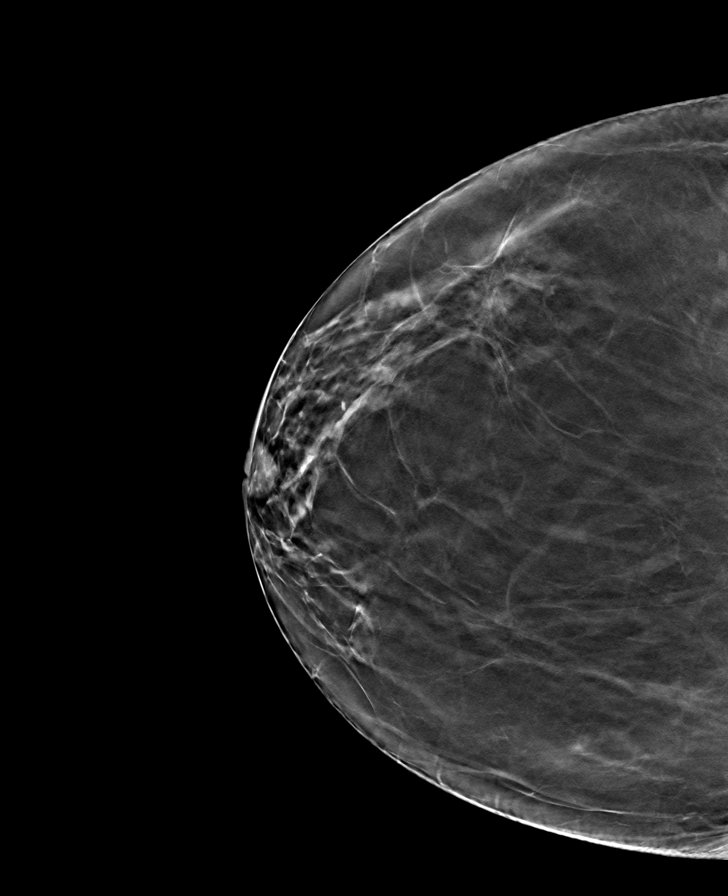

[L CC tomo · tomo slice 34/67.0]
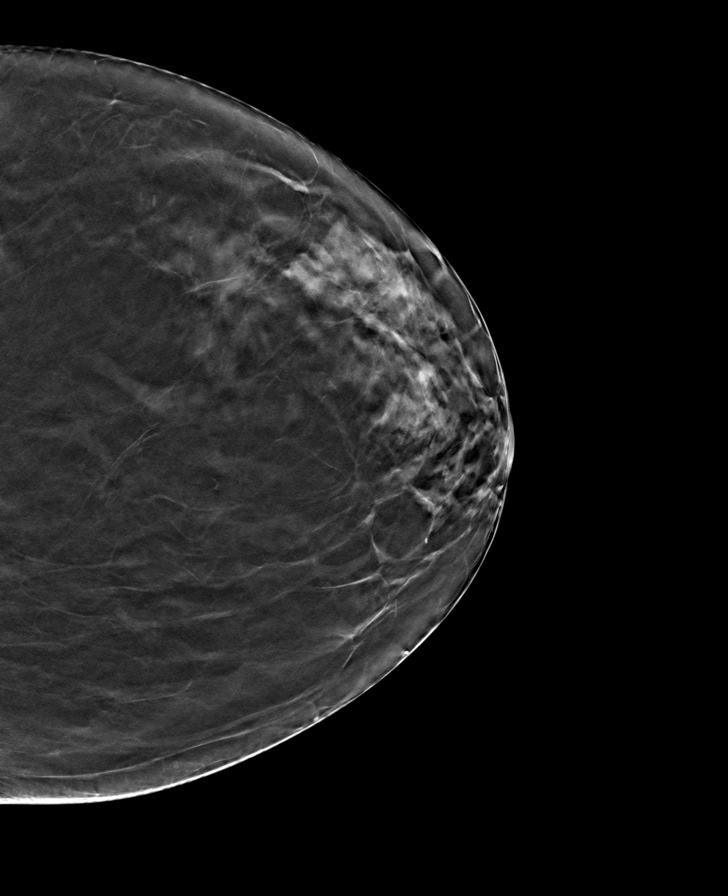

[8 of 24 positions shown; findings below may reference images not displayed]

ACR Breast Density Category b: There are scattered areas of
fibroglandular density.
FINDINGS: In the left breast, calcifications warrant further evaluation. In
the right breast, no findings suspicious for malignancy. Images were
processed with CAD.
IMPRESSION: Further evaluation is suggested for calcifications in the left
breast.

RECOMMENDATION:
Diagnostic mammogram of the left breast. (Code:[id number])

The patient will be contacted regarding the findings, and additional
imaging will be scheduled.

BI-RADS CATEGORY  0: Incomplete. Need additional imaging evaluation
and/or prior mammograms for comparison.

## 2018-03-28 ENCOUNTER — Telehealth: Payer: Self-pay | Admitting: Family Medicine

## 2018-03-28 DIAGNOSIS — Z79899 Other long term (current) drug therapy: Secondary | ICD-10-CM

## 2018-03-28 DIAGNOSIS — Z13220 Encounter for screening for lipoid disorders: Secondary | ICD-10-CM

## 2018-03-28 NOTE — Telephone Encounter (Signed)
Pt has a physical scheduled for 04/25/2018 with Dr.Steve and patient would like to know if there is any blood work recommended.  Advise.

## 2018-03-28 NOTE — Telephone Encounter (Signed)
Blood work ordered in Epic. Left message to return call to notify patient. 

## 2018-03-28 NOTE — Telephone Encounter (Signed)
Last labs on 03/27/2016 were Lipid, Hepatic and BMET. Please advise. Thank you

## 2018-03-28 NOTE — Telephone Encounter (Signed)
Patient notified and verbalized understanding. 

## 2018-03-28 NOTE — Telephone Encounter (Signed)
Same plus cbc 

## 2018-04-14 DIAGNOSIS — Z13220 Encounter for screening for lipoid disorders: Secondary | ICD-10-CM | POA: Diagnosis not present

## 2018-04-14 DIAGNOSIS — Z79899 Other long term (current) drug therapy: Secondary | ICD-10-CM | POA: Diagnosis not present

## 2018-04-15 LAB — LIPID PANEL
CHOL/HDL RATIO: 3.2 ratio (ref 0.0–4.4)
Cholesterol, Total: 238 mg/dL — ABNORMAL HIGH (ref 100–199)
HDL: 74 mg/dL (ref >39)
LDL CALC: 141 mg/dL — AB (ref 0–99)
TRIGLYCERIDES: 115 mg/dL (ref 0–149)
VLDL CHOLESTEROL CAL: 23 mg/dL (ref 5–40)

## 2018-04-15 LAB — CBC WITH DIFFERENTIAL/PLATELET
Basophils Absolute: 0 10*3/uL (ref 0.0–0.2)
Basos: 0 %
EOS (ABSOLUTE): 0.3 10*3/uL (ref 0.0–0.4)
Eos: 3 %
HEMATOCRIT: 39.7 % (ref 34.0–46.6)
Hemoglobin: 13.2 g/dL (ref 11.1–15.9)
IMMATURE GRANS (ABS): 0 10*3/uL (ref 0.0–0.1)
Immature Granulocytes: 0 %
Lymphocytes Absolute: 2 10*3/uL (ref 0.7–3.1)
Lymphs: 24 %
MCH: 29.3 pg (ref 26.6–33.0)
MCHC: 33.2 g/dL (ref 31.5–35.7)
MCV: 88 fL (ref 79–97)
MONOCYTES: 6 %
MONOS ABS: 0.5 10*3/uL (ref 0.1–0.9)
NEUTROS PCT: 67 %
Neutrophils Absolute: 5.5 10*3/uL (ref 1.4–7.0)
PLATELETS: 295 10*3/uL (ref 150–450)
RBC: 4.5 x10E6/uL (ref 3.77–5.28)
RDW: 13.3 % (ref 11.7–15.4)
WBC: 8.4 10*3/uL (ref 3.4–10.8)

## 2018-04-15 LAB — BASIC METABOLIC PANEL
BUN / CREAT RATIO: 23 (ref 12–28)
BUN: 16 mg/dL (ref 8–27)
CALCIUM: 9 mg/dL (ref 8.7–10.3)
CHLORIDE: 101 mmol/L (ref 96–106)
CO2: 24 mmol/L (ref 20–29)
CREATININE: 0.7 mg/dL (ref 0.57–1.00)
GFR calc Af Amer: 96 mL/min/{1.73_m2} (ref >59)
GFR calc non Af Amer: 83 mL/min/{1.73_m2} (ref >59)
GLUCOSE: 96 mg/dL (ref 65–99)
Potassium: 4.2 mmol/L (ref 3.5–5.2)
Sodium: 142 mmol/L (ref 134–144)

## 2018-04-15 LAB — HEPATIC FUNCTION PANEL
ALT: 13 IU/L (ref 0–32)
AST: 12 IU/L (ref 0–40)
Albumin: 4.3 g/dL (ref 3.7–4.7)
Alkaline Phosphatase: 72 IU/L (ref 39–117)
BILIRUBIN, DIRECT: 0.1 mg/dL (ref 0.00–0.40)
Bilirubin Total: 0.3 mg/dL (ref 0.0–1.2)
TOTAL PROTEIN: 6.9 g/dL (ref 6.0–8.5)

## 2018-04-25 ENCOUNTER — Ambulatory Visit (INDEPENDENT_AMBULATORY_CARE_PROVIDER_SITE_OTHER): Payer: Medicare Other | Admitting: Family Medicine

## 2018-04-25 ENCOUNTER — Encounter: Payer: Self-pay | Admitting: Family Medicine

## 2018-04-25 VITALS — BP 154/90 | Ht 60.0 in | Wt 210.6 lb

## 2018-04-25 DIAGNOSIS — Z0000 Encounter for general adult medical examination without abnormal findings: Secondary | ICD-10-CM

## 2018-04-25 NOTE — Progress Notes (Signed)
Subjective:    Patient ID: Kerry Richardson, female    DOB: [birth year], 78 y.o.   MRN: [id number]  HPI AWV- Annual Wellness Visit  The patient was seen for their annual wellness visit. The patient's past medical history, surgical history, and family history were reviewed. Pertinent vaccines were reviewed ( tetanus, pneumonia, shingles, flu) The patient's medication list was reviewed and updated.  The height and weight were entered.  BMI recorded in electronic record elsewhere  Cognitive screening was completed. Outcome of Mini - Cog: pass   Falls /depression screening electronically recorded within record elsewhere  Current tobacco usage:none (All patients who use tobacco were given written and verbal information on quitting)  Recent listing of emergency department/hospitalizations over the past year were reviewed.  current specialist the patient sees on a regular basis:    Medicare annual wellness visit patient questionnaire was reviewed.  A written screening schedule for the patient for the next 5-10 years was given. Appropriate discussion of followup regarding next visit was discussed.   Pt having shortness of breath and wheezing when doing physical activity such as walking. Going on for about several months. Pt notes with out any sig activity develpos sob and wheezing at times   Not using the inhaler much at al   keepng a wide open pace with demented husband, pt s husb is irritabl with Still having issues with agitaiton and suc     Staying active with husb vbut not excising  Not seelping the best   Results for orders placed or performed in visit on 03/28/18  Lipid panel  Result Value Ref Range   Cholesterol, Total 238 (H) 100 - 199 mg/dL   Triglycerides 409115 0 - 149 mg/dL   HDL 74 >81>39 mg/dL   VLDL Cholesterol Cal 23 5 - 40 mg/dL   LDL Calculated 191141 (H) 0 - 99 mg/dL   Chol/HDL Ratio 3.2 0.0 - 4.4 ratio  Hepatic function panel  Result Value Ref Range   Total  Protein 6.9 6.0 - 8.5 g/dL   Albumin 4.3 3.7 - 4.7 g/dL   Bilirubin Total 0.3 0.0 - 1.2 mg/dL   Bilirubin, Direct 4.780.10 0.00 - 0.40 mg/dL   Alkaline Phosphatase 72 39 - 117 IU/L   AST 12 0 - 40 IU/L   ALT 13 0 - 32 IU/L  Basic metabolic panel  Result Value Ref Range   Glucose 96 65 - 99 mg/dL   BUN 16 8 - 27 mg/dL   Creatinine, Ser 2.950.70 0.57 - 1.00 mg/dL   GFR calc non Af Amer 83 >59 mL/min/1.73   GFR calc Af Amer 96 >59 mL/min/1.73   BUN/Creatinine Ratio 23 12 - 28   Sodium 142 134 - 144 mmol/L   Potassium 4.2 3.5 - 5.2 mmol/L   Chloride 101 96 - 106 mmol/L   CO2 24 20 - 29 mmol/L   Calcium 9.0 8.7 - 10.3 mg/dL  CBC with Differential/Platelet  Result Value Ref Range   WBC 8.4 3.4 - 10.8 x10E3/uL   RBC 4.50 3.77 - 5.28 x10E6/uL   Hemoglobin 13.2 11.1 - 15.9 g/dL   Hematocrit 62.139.7 30.834.0 - 46.6 %   MCV 88 79 - 97 fL   MCH 29.3 26.6 - 33.0 pg   MCHC 33.2 31.5 - 35.7 g/dL   RDW 65.713.3 84.611.7 - 96.215.4 %   Platelets 295 150 - 450 x10E3/uL   Neutrophils 67 Not Estab. %   Lymphs 24 Not Estab. %  Monocytes 6 Not Estab. %   Eos 3 Not Estab. %   Basos 0 Not Estab. %   Neutrophils Absolute 5.5 1.4 - 7.0 x10E3/uL   Lymphocytes Absolute 2.0 0.7 - 3.1 x10E3/uL   Monocytes Absolute 0.5 0.1 - 0.9 x10E3/uL   EOS (ABSOLUTE) 0.3 0.0 - 0.4 x10E3/uL   Basophils Absolute 0.0 0.0 - 0.2 x10E3/uL   Immature Granulocytes 0 Not Estab. %   Immature Grans (Abs) 0.0 0.0 - 0.1 x10E3/uL       Review of Systems .rs No headache, no major weight loss or weight gain, no chest pain no back pain abdominal pain no change in bowel habits complete ROS otherwise negative     Objective:   Physical Exam  Alert and oriented, vitals reviewed and stable, NAD ENT-TM's and ext canals WNL bilat via otoscopic exam Soft palate, tonsils and post pharynx WNL via oropharyngeal exam Neck-symmetric, no masses; thyroid nonpalpable and nontender Pulmonary-no tachypnea or accessory muscle use; Clear without wheezes via  auscultation Card--no abnrml murmurs, rhythm reg and rate WNL Carotid pulses symmetric, without bruits       Assessment & Plan:  #1 impression wellness exam.  Patient declines flu shot.  Diet discussed.  Exercise discussed.  Blood work reviewed.  Overall excellent results.  2.  Dyspnea.  Only with exertion.  On further history virtually no exercise bedtime.  Patient said walking regularly again  3.  Caretaker stress.  Considerable in nature.  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  The demented spouse's children are out of the [place] and not particularly accessible.  He admits to feeling down at times but wishes no medication.  Advised patient at one point she will be unable to care for her husband at home.  Patient expresses understanding

## 2018-04-27 ENCOUNTER — Other Ambulatory Visit: Payer: Self-pay | Admitting: Family Medicine

## 2018-04-27 ENCOUNTER — Telehealth: Payer: Self-pay | Admitting: Family Medicine

## 2018-04-27 NOTE — Telephone Encounter (Signed)
Let's do 

## 2018-04-27 NOTE — Telephone Encounter (Signed)
Please advise 

## 2018-04-27 NOTE — Telephone Encounter (Signed)
Requesting a mammogram for Bilateral diagnostic, bilateral ultrasound with axilla.   (pt states Dr.Steve recommended completing mammogram, pt contacted [facility] and they recommend mammogram listed above.)

## 2018-04-28 ENCOUNTER — Other Ambulatory Visit: Payer: Self-pay | Admitting: *Deleted

## 2018-04-28 DIAGNOSIS — R921 Mammographic calcification found on diagnostic imaging of breast: Secondary | ICD-10-CM

## 2018-04-28 DIAGNOSIS — Z1231 Encounter for screening mammogram for malignant neoplasm of breast: Secondary | ICD-10-CM

## 2018-04-28 NOTE — Telephone Encounter (Signed)
Pt states she wants after 11am and leave appt info on her voicemail if she does not answer phone.

## 2018-04-28 NOTE — Telephone Encounter (Signed)
appt scheduled feb 25th at 3:20 register 3 pm. Pt notified on her voicemail.

## 2018-05-17 ENCOUNTER — Ambulatory Visit (HOSPITAL_COMMUNITY): Admission: RE | Admit: 2018-05-17 | Payer: Medicare Other | Source: Ambulatory Visit

## 2018-05-17 ENCOUNTER — Ambulatory Visit (HOSPITAL_COMMUNITY): Payer: Medicare Other

## 2018-05-17 ENCOUNTER — Ambulatory Visit (HOSPITAL_COMMUNITY)
Admission: RE | Admit: 2018-05-17 | Discharge: 2018-05-17 | Disposition: A | Discharge status: Home / Self Care | Payer: Medicare Other | Source: Ambulatory Visit | Attending: Family Medicine | Admitting: Family Medicine

## 2018-05-17 DIAGNOSIS — R921 Mammographic calcification found on diagnostic imaging of breast: Secondary | ICD-10-CM | POA: Insufficient documentation

## 2018-06-15 ENCOUNTER — Other Ambulatory Visit: Payer: Self-pay | Admitting: Family Medicine

## 2018-07-20 ENCOUNTER — Other Ambulatory Visit: Payer: Self-pay | Admitting: Family Medicine

## 2018-08-24 ENCOUNTER — Other Ambulatory Visit: Payer: Self-pay | Admitting: Family Medicine

## 2018-09-07 DIAGNOSIS — Z0120 Encounter for dental examination and cleaning without abnormal findings: Secondary | ICD-10-CM | POA: Diagnosis not present

## 2018-09-21 ENCOUNTER — Other Ambulatory Visit: Payer: Self-pay | Admitting: Family Medicine

## 2018-09-27 ENCOUNTER — Other Ambulatory Visit: Payer: Self-pay | Admitting: Family Medicine

## 2018-10-12 ENCOUNTER — Telehealth: Payer: Self-pay | Admitting: Family Medicine

## 2018-10-12 NOTE — Telephone Encounter (Signed)
Tried to contact patient per Dr.Steve request. Home phone kept ringing; mobile phone was answered by sister because pt was on home phone. Left message to return our call due to Dr.Steve wanting to talk with her.

## 2018-10-13 NOTE — Telephone Encounter (Signed)
Um, I spoke to her last eve

## 2018-10-18 DIAGNOSIS — H40053 Ocular hypertension, bilateral: Secondary | ICD-10-CM | POA: Diagnosis not present

## 2018-11-11 ENCOUNTER — Other Ambulatory Visit: Payer: Self-pay | Admitting: Family Medicine

## 2019-02-22 ENCOUNTER — Encounter: Payer: Self-pay | Admitting: Family Medicine

## 2019-02-22 ENCOUNTER — Other Ambulatory Visit: Payer: Self-pay

## 2019-02-22 ENCOUNTER — Ambulatory Visit (INDEPENDENT_AMBULATORY_CARE_PROVIDER_SITE_OTHER): Payer: Medicare Other | Admitting: Family Medicine

## 2019-02-22 VITALS — BP 128/82 | Temp 97.6°F | Wt 211.6 lb

## 2019-02-22 DIAGNOSIS — S29011A Strain of muscle and tendon of front wall of thorax, initial encounter: Secondary | ICD-10-CM | POA: Diagnosis not present

## 2019-02-22 NOTE — Progress Notes (Signed)
   Subjective:    Patient ID: Kerry Richardson, female    DOB: 03/18/1941, 78 y.o.   MRN: [id number]  HPI  Patient arrives with back pain that wraps around the front in a band. Patient wondered if it was shingles because she has had shingles before but has no rash.  Severe at times right flank  Radiates anteriorly   Takes four ib u  At nioght  Wakes up stil hurting, worse tho as the day goes on   No g I no resp symtoms  usua arthritits   Movement does not teally affect the pai  n  Tries to find the position not so bad    No change in urinary habits   apetite not hundered percent     Review of Systems No headache, no major weight loss or weight gain, no chest pain no back pain abdominal pain no change in bowel habits complete ROS otherwise negative     Objective:   Physical Exam Alert vitals stable, NAD. Blood pressure good on repeat. HEENT normal. Lungs clear. Heart regular rate and rhythm. Right lateral chest wall distinctly tender to deep palpation.  No CVA tenderness.  Negative abdominal tenderness.  Positive anterolateral costal margin tenderness       Assessment & Plan:  Impression chest wall strain.  Rationale discussed.  Anti-inflammatory medicine recommended.  Over-the-counter.  Symptom care discussed warning signs discussed

## 2019-02-27 DIAGNOSIS — L578 Other skin changes due to chronic exposure to nonionizing radiation: Secondary | ICD-10-CM | POA: Diagnosis not present

## 2019-02-27 DIAGNOSIS — L820 Inflamed seborrheic keratosis: Secondary | ICD-10-CM | POA: Diagnosis not present

## 2019-02-27 DIAGNOSIS — L570 Actinic keratosis: Secondary | ICD-10-CM | POA: Diagnosis not present

## 2019-02-27 DIAGNOSIS — C44722 Squamous cell carcinoma of skin of right lower limb, including hip: Secondary | ICD-10-CM | POA: Diagnosis not present

## 2019-02-27 DIAGNOSIS — D1801 Hemangioma of skin and subcutaneous tissue: Secondary | ICD-10-CM | POA: Diagnosis not present

## 2019-02-27 DIAGNOSIS — D225 Melanocytic nevi of trunk: Secondary | ICD-10-CM | POA: Diagnosis not present

## 2019-02-27 DIAGNOSIS — C44329 Squamous cell carcinoma of skin of other parts of face: Secondary | ICD-10-CM | POA: Diagnosis not present

## 2019-03-20 DIAGNOSIS — Z0120 Encounter for dental examination and cleaning without abnormal findings: Secondary | ICD-10-CM | POA: Diagnosis not present

## 2019-03-28 DIAGNOSIS — H401132 Primary open-angle glaucoma, bilateral, moderate stage: Secondary | ICD-10-CM | POA: Diagnosis not present

## 2019-04-04 DIAGNOSIS — L578 Other skin changes due to chronic exposure to nonionizing radiation: Secondary | ICD-10-CM | POA: Diagnosis not present

## 2019-04-04 DIAGNOSIS — D0439 Carcinoma in situ of skin of other parts of face: Secondary | ICD-10-CM | POA: Diagnosis not present

## 2019-04-04 DIAGNOSIS — D0471 Carcinoma in situ of skin of right lower limb, including hip: Secondary | ICD-10-CM | POA: Diagnosis not present

## 2019-04-27 ENCOUNTER — Other Ambulatory Visit (HOSPITAL_COMMUNITY): Payer: Self-pay | Admitting: Family Medicine

## 2019-04-27 ENCOUNTER — Encounter: Payer: Self-pay | Admitting: Family Medicine

## 2019-04-27 DIAGNOSIS — Z1231 Encounter for screening mammogram for malignant neoplasm of breast: Secondary | ICD-10-CM

## 2019-05-03 ENCOUNTER — Encounter: Payer: Self-pay | Admitting: Family Medicine

## 2019-05-03 ENCOUNTER — Other Ambulatory Visit: Payer: Self-pay

## 2019-05-03 ENCOUNTER — Ambulatory Visit (INDEPENDENT_AMBULATORY_CARE_PROVIDER_SITE_OTHER): Payer: Medicare Other | Admitting: Family Medicine

## 2019-05-03 VITALS — BP 120/78 | Temp 98.2°F | Ht 60.0 in | Wt 213.6 lb

## 2019-05-03 DIAGNOSIS — Z13220 Encounter for screening for lipoid disorders: Secondary | ICD-10-CM | POA: Diagnosis not present

## 2019-05-03 DIAGNOSIS — Z0000 Encounter for general adult medical examination without abnormal findings: Secondary | ICD-10-CM

## 2019-05-03 DIAGNOSIS — Z79899 Other long term (current) drug therapy: Secondary | ICD-10-CM

## 2019-05-03 NOTE — Progress Notes (Signed)
   Subjective:    Patient ID: Kerry Richardson, female    DOB: 11/17/1940, 79 y.o.   MRN: [id number]  HPI The patient comes in today for a wellness visit.    A review of their health history was completed.  A review of medications was also completed.  Any needed refills; no  Eating habits: not too bad  Falls/  MVA accidents in past few months: none  Regular exercise: not so much- back issues  Specialist pt sees on regular basis: dermatologist- just had skin cancer removed  Preventative health issues were discussed.   Additional concerns: check knot on left hand middle finger  Patient has mammo scheduled 05/22/2019  b  w is due     Review of Systems  Constitutional: Negative for activity change, appetite change and fatigue.  HENT: Negative for congestion and rhinorrhea.   Eyes: Negative for discharge.  Respiratory: Negative for cough, chest tightness and wheezing.   Cardiovascular: Negative for chest pain.  Gastrointestinal: Negative for abdominal pain, blood in stool and vomiting.  Endocrine: Negative for polyphagia.  Genitourinary: Negative for difficulty urinating and frequency.  Musculoskeletal: Negative for neck pain.  Skin: Negative for color change.  Allergic/Immunologic: Negative for environmental allergies and food allergies.  Neurological: Negative for weakness and headaches.  Psychiatric/Behavioral: Negative for agitation and behavioral problems.  All other systems reviewed and are negative.      Objective:   Physical Exam Vitals reviewed.  Constitutional:      Appearance: She is well-developed.  HENT:     Head: Normocephalic.     Right Ear: External ear normal.     Left Ear: External ear normal.  Eyes:     Pupils: Pupils are equal, round, and reactive to light.  Neck:     Thyroid: No thyromegaly.  Cardiovascular:     Rate and Rhythm: Normal rate and regular rhythm.     Heart sounds: Normal heart sounds. No murmur.  Pulmonary:     Effort: Pulmonary  effort is normal. No respiratory distress.     Breath sounds: Normal breath sounds. No wheezing.  Abdominal:     General: Bowel sounds are normal. There is no distension.     Palpations: Abdomen is soft. There is no mass.     Tenderness: There is no abdominal tenderness.  Musculoskeletal:        General: No tenderness. Normal range of motion.     Cervical back: Normal range of motion.  Lymphadenopathy:     Cervical: No cervical adenopathy.  Skin:    General: Skin is warm and dry.  Neurological:     Mental Status: She is alert and oriented to person, place, and time.     Motor: No abnormal muscle tone.  Psychiatric:        Behavior: Behavior normal.           Assessment & Plan:  Impression wellness exam.  Up-to-date on mammograms next 1 due soon.  Diet discussed.  Exercise discussed.  Vaccines discussed.  Blood work discussed and ordered.

## 2019-05-04 DIAGNOSIS — Z79899 Other long term (current) drug therapy: Secondary | ICD-10-CM | POA: Diagnosis not present

## 2019-05-04 DIAGNOSIS — Z13220 Encounter for screening for lipoid disorders: Secondary | ICD-10-CM | POA: Diagnosis not present

## 2019-05-05 LAB — BASIC METABOLIC PANEL
BUN/Creatinine Ratio: 19 (ref 12–28)
BUN: 15 mg/dL (ref 8–27)
CO2: 25 mmol/L (ref 20–29)
Calcium: 9.4 mg/dL (ref 8.7–10.3)
Chloride: 101 mmol/L (ref 96–106)
Creatinine, Ser: 0.79 mg/dL (ref 0.57–1.00)
GFR calc Af Amer: 82 mL/min/{1.73_m2} (ref >59)
GFR calc non Af Amer: 71 mL/min/{1.73_m2} (ref >59)
Glucose: 97 mg/dL (ref 65–99)
Potassium: 4.9 mmol/L (ref 3.5–5.2)
Sodium: 141 mmol/L (ref 134–144)

## 2019-05-05 LAB — LIPID PANEL
Chol/HDL Ratio: 3.5 ratio (ref 0.0–4.4)
Cholesterol, Total: 250 mg/dL — ABNORMAL HIGH (ref 100–199)
HDL: 71 mg/dL (ref >39)
LDL Chol Calc (NIH): 156 mg/dL — ABNORMAL HIGH (ref 0–99)
Triglycerides: 131 mg/dL (ref 0–149)
VLDL Cholesterol Cal: 23 mg/dL (ref 5–40)

## 2019-05-05 LAB — HEPATIC FUNCTION PANEL
ALT: 14 IU/L (ref 0–32)
AST: 13 IU/L (ref 0–40)
Albumin: 4 g/dL (ref 3.7–4.7)
Alkaline Phosphatase: 90 IU/L (ref 39–117)
Bilirubin Total: 0.3 mg/dL (ref 0.0–1.2)
Bilirubin, Direct: 0.11 mg/dL (ref 0.00–0.40)
Total Protein: 6.9 g/dL (ref 6.0–8.5)

## 2019-05-07 ENCOUNTER — Encounter: Payer: Self-pay | Admitting: Family Medicine

## 2019-05-22 ENCOUNTER — Other Ambulatory Visit: Payer: Self-pay

## 2019-05-22 ENCOUNTER — Ambulatory Visit (HOSPITAL_COMMUNITY)
Admission: RE | Admit: 2019-05-22 | Discharge: 2019-05-22 | Disposition: A | Discharge status: Home / Self Care | Payer: Medicare Other | Source: Ambulatory Visit | Attending: Family Medicine | Admitting: Family Medicine

## 2019-05-22 DIAGNOSIS — Z1231 Encounter for screening mammogram for malignant neoplasm of breast: Secondary | ICD-10-CM | POA: Diagnosis not present

## 2019-05-29 DIAGNOSIS — L821 Other seborrheic keratosis: Secondary | ICD-10-CM | POA: Diagnosis not present

## 2019-05-29 DIAGNOSIS — L905 Scar conditions and fibrosis of skin: Secondary | ICD-10-CM | POA: Diagnosis not present

## 2019-05-29 DIAGNOSIS — Z85828 Personal history of other malignant neoplasm of skin: Secondary | ICD-10-CM | POA: Diagnosis not present

## 2019-05-29 DIAGNOSIS — L570 Actinic keratosis: Secondary | ICD-10-CM | POA: Diagnosis not present

## 2019-06-05 ENCOUNTER — Other Ambulatory Visit: Payer: Self-pay | Admitting: Family Medicine

## 2019-09-12 DIAGNOSIS — L905 Scar conditions and fibrosis of skin: Secondary | ICD-10-CM | POA: Diagnosis not present

## 2019-09-12 DIAGNOSIS — L249 Irritant contact dermatitis, unspecified cause: Secondary | ICD-10-CM | POA: Diagnosis not present

## 2019-09-12 DIAGNOSIS — L244 Irritant contact dermatitis due to drugs in contact with skin: Secondary | ICD-10-CM | POA: Diagnosis not present

## 2019-09-12 DIAGNOSIS — L820 Inflamed seborrheic keratosis: Secondary | ICD-10-CM | POA: Diagnosis not present

## 2019-10-06 DIAGNOSIS — H401192 Primary open-angle glaucoma, unspecified eye, moderate stage: Secondary | ICD-10-CM | POA: Diagnosis not present

## 2019-10-25 ENCOUNTER — Ambulatory Visit (HOSPITAL_COMMUNITY)
Admission: RE | Admit: 2019-10-25 | Discharge: 2019-10-25 | Disposition: A | Discharge status: Home / Self Care | Payer: Medicare Other | Source: Ambulatory Visit | Attending: Family Medicine | Admitting: Family Medicine

## 2019-10-25 ENCOUNTER — Other Ambulatory Visit (HOSPITAL_COMMUNITY)
Admission: RE | Admit: 2019-10-25 | Discharge: 2019-10-25 | Disposition: A | Discharge status: Home / Self Care | Payer: Medicare Other | Source: Ambulatory Visit | Attending: Family Medicine | Admitting: Family Medicine

## 2019-10-25 ENCOUNTER — Other Ambulatory Visit: Payer: Self-pay

## 2019-10-25 ENCOUNTER — Encounter: Payer: Self-pay | Admitting: Family Medicine

## 2019-10-25 ENCOUNTER — Ambulatory Visit (INDEPENDENT_AMBULATORY_CARE_PROVIDER_SITE_OTHER): Payer: Medicare Other | Admitting: Family Medicine

## 2019-10-25 VITALS — BP 150/88 | HR 91 | Temp 97.5°F | Wt 212.8 lb

## 2019-10-25 DIAGNOSIS — R519 Headache, unspecified: Secondary | ICD-10-CM | POA: Insufficient documentation

## 2019-10-25 DIAGNOSIS — I6782 Cerebral ischemia: Secondary | ICD-10-CM | POA: Diagnosis not present

## 2019-10-25 DIAGNOSIS — R4701 Aphasia: Secondary | ICD-10-CM | POA: Diagnosis not present

## 2019-10-25 DIAGNOSIS — Z79899 Other long term (current) drug therapy: Secondary | ICD-10-CM | POA: Insufficient documentation

## 2019-10-25 DIAGNOSIS — I10 Essential (primary) hypertension: Secondary | ICD-10-CM | POA: Insufficient documentation

## 2019-10-25 DIAGNOSIS — G319 Degenerative disease of nervous system, unspecified: Secondary | ICD-10-CM | POA: Diagnosis not present

## 2019-10-25 DIAGNOSIS — R030 Elevated blood-pressure reading, without diagnosis of hypertension: Secondary | ICD-10-CM

## 2019-10-25 DIAGNOSIS — G459 Transient cerebral ischemic attack, unspecified: Secondary | ICD-10-CM

## 2019-10-25 LAB — CBC WITH DIFFERENTIAL/PLATELET
Abs Immature Granulocytes: 0.03 10*3/uL (ref 0.00–0.07)
Basophils Absolute: 0 10*3/uL (ref 0.0–0.1)
Basophils Relative: 0 %
Eosinophils Absolute: 0.1 10*3/uL (ref 0.0–0.5)
Eosinophils Relative: 1 %
HCT: 42.8 % (ref 36.0–46.0)
Hemoglobin: 13.4 g/dL (ref 12.0–15.0)
Immature Granulocytes: 0 %
Lymphocytes Relative: 20 %
Lymphs Abs: 2 10*3/uL (ref 0.7–4.0)
MCH: 28.8 pg (ref 26.0–34.0)
MCHC: 31.3 g/dL (ref 30.0–36.0)
MCV: 92 fL (ref 80.0–100.0)
Monocytes Absolute: 0.6 10*3/uL (ref 0.1–1.0)
Monocytes Relative: 6 %
Neutro Abs: 6.9 10*3/uL (ref 1.7–7.7)
Neutrophils Relative %: 73 %
Platelets: 337 10*3/uL (ref 150–400)
RBC: 4.65 MIL/uL (ref 3.87–5.11)
RDW: 14.6 % (ref 11.5–15.5)
WBC: 9.6 10*3/uL (ref 4.0–10.5)
nRBC: 0 % (ref 0.0–0.2)

## 2019-10-25 LAB — BASIC METABOLIC PANEL
Anion gap: 12 (ref 5–15)
BUN: 21 mg/dL (ref 8–23)
CO2: 24 mmol/L (ref 22–32)
Calcium: 9.5 mg/dL (ref 8.9–10.3)
Chloride: 102 mmol/L (ref 98–111)
Creatinine, Ser: 0.61 mg/dL (ref 0.44–1.00)
GFR calc Af Amer: >60 mL/min (ref >60)
GFR calc non Af Amer: >60 mL/min (ref >60)
Glucose, Bld: 97 mg/dL (ref 70–99)
Potassium: 3.9 mmol/L (ref 3.5–5.1)
Sodium: 138 mmol/L (ref 135–145)

## 2019-10-25 MED ORDER — AMLODIPINE BESYLATE 2.5 MG PO TABS
2.5000 mg | ORAL_TABLET | Freq: Every day | ORAL | 2 refills | Status: DC
Start: 2019-10-25 — End: 2019-10-28

## 2019-10-25 NOTE — Progress Notes (Signed)
Subjective:    Patient ID: Kerry Richardson, female    DOB: 08/15/1940, 79 y.o.   MRN: [id number]  HPI Patient comes in today after having a severe headache Sunday and not being able to communicate Monday.  States she was not able to get her words out, read or anything.  Reports still having trouble reading and some difficulty getting out what she is trying to say today. Sudden and sever Worse on left side No nausea no V No blurred vision layed down Lasted for hours, was most severe when it started Moderate on Monday Not able to get her words out the way they should noticed this on Monday No history of headaches recently Past few weeks having some headaches which is unusual for her Sometimes eadsrly in the morning Some difficulty with reading comprehension No weakness or tingling  Review of Systems  Constitutional: Negative for activity change, appetite change and fatigue.  HENT: Negative for congestion and rhinorrhea.   Respiratory: Negative for cough and shortness of breath.   Cardiovascular: Negative for chest pain and leg swelling.  Gastrointestinal: Negative for abdominal pain and diarrhea.  Endocrine: Negative for polydipsia and polyphagia.  Skin: Negative for color change.  Neurological: Positive for speech difficulty, weakness, light-headedness and headaches. Negative for dizziness and numbness.  Psychiatric/Behavioral: Negative for behavioral problems and confusion.       Objective:   Physical Exam Vitals reviewed.  Constitutional:      General: She is not in acute distress. HENT:     Head: Normocephalic and atraumatic.  Eyes:     General:        Right eye: No discharge.        Left eye: No discharge.  Neck:     Trachea: No tracheal deviation.  Cardiovascular:     Rate and Rhythm: Normal rate and regular rhythm.     Heart sounds: Normal heart sounds. No murmur heard.   Pulmonary:     Effort: Pulmonary effort is normal. No respiratory distress.     Breath  sounds: Normal breath sounds.  Lymphadenopathy:     Cervical: No cervical adenopathy.  Skin:    General: Skin is warm and dry.  Neurological:     Mental Status: She is alert.     Coordination: Coordination normal.  Psychiatric:        Behavior: Behavior normal.    Finger-to-nose normal Romberg does have wavering Able to walk all the way adequately Neck supple      Assessment & Plan:  1. Severe headache Severe headache on Sunday associated with aphasia concerning for the possibility less likely subarachnoid hemorrhage, also possibility of stroke.  Given the urgency of this CAT scan necessary.  May need MRI as well depending on the results of the CAT scan.  CAT scan more sensitive for looking at subarachnoid hemorrhage.  Less likely tumor.  Patient aware. Stat scan medically indicated - CT Head Wo Contrast - CBC with Differential/Platelet - Basic metabolic panel  2. Aphasia Concerning for possibility of stroke.  Await results of scan see above - CT Head Wo Contrast - CBC with Differential/Platelet - Basic metabolic panel  3. Elevated blood pressure reading Blood pressure can be elevated in these situations do not recommend medication currently until we have better knowledge of scan results, then control of blood pressure would be wise  Complex If patient severely worse over the next 24 hours go to ER 30 minutes was spent with the patient including previsit  chart review, time spent with patient, discussion of health issues, review of data including medical record, and documentation of the visit.

## 2019-10-26 NOTE — Addendum Note (Signed)
Addended by: Margaretha Sheffield on: 10/26/2019 10:05 AM   Modules accepted: Orders

## 2019-10-26 NOTE — Addendum Note (Signed)
Addended by: Margaretha Sheffield on: 10/26/2019 10:06 AM   Modules accepted: Orders

## 2019-10-27 ENCOUNTER — Observation Stay (HOSPITAL_COMMUNITY): Payer: Medicare Other

## 2019-10-27 ENCOUNTER — Ambulatory Visit (HOSPITAL_COMMUNITY)
Admission: RE | Admit: 2019-10-27 | Discharge: 2019-10-27 | Disposition: A | Discharge status: Home / Self Care | Payer: Medicare Other | Source: Ambulatory Visit | Attending: Family Medicine | Admitting: Family Medicine

## 2019-10-27 ENCOUNTER — Encounter (HOSPITAL_COMMUNITY): Payer: Self-pay | Admitting: Emergency Medicine

## 2019-10-27 ENCOUNTER — Other Ambulatory Visit: Payer: Self-pay

## 2019-10-27 ENCOUNTER — Observation Stay (HOSPITAL_COMMUNITY)
Admission: EM | Admit: 2019-10-27 | Discharge: 2019-10-28 | Disposition: A | Discharge status: Home / Self Care | Payer: Medicare Other | Attending: Family Medicine | Admitting: Family Medicine

## 2019-10-27 ENCOUNTER — Telehealth: Payer: Self-pay | Admitting: Family Medicine

## 2019-10-27 DIAGNOSIS — Z6841 Body Mass Index (BMI) 40.0 and over, adult: Secondary | ICD-10-CM | POA: Insufficient documentation

## 2019-10-27 DIAGNOSIS — E669 Obesity, unspecified: Secondary | ICD-10-CM | POA: Insufficient documentation

## 2019-10-27 DIAGNOSIS — I6522 Occlusion and stenosis of left carotid artery: Secondary | ICD-10-CM | POA: Diagnosis not present

## 2019-10-27 DIAGNOSIS — Z7901 Long term (current) use of anticoagulants: Secondary | ICD-10-CM | POA: Diagnosis not present

## 2019-10-27 DIAGNOSIS — M6281 Muscle weakness (generalized): Secondary | ICD-10-CM | POA: Diagnosis not present

## 2019-10-27 DIAGNOSIS — Z79899 Other long term (current) drug therapy: Secondary | ICD-10-CM | POA: Insufficient documentation

## 2019-10-27 DIAGNOSIS — R4701 Aphasia: Secondary | ICD-10-CM | POA: Insufficient documentation

## 2019-10-27 DIAGNOSIS — R030 Elevated blood-pressure reading, without diagnosis of hypertension: Secondary | ICD-10-CM | POA: Insufficient documentation

## 2019-10-27 DIAGNOSIS — Z20822 Contact with and (suspected) exposure to covid-19: Secondary | ICD-10-CM | POA: Insufficient documentation

## 2019-10-27 DIAGNOSIS — I639 Cerebral infarction, unspecified: Principal | ICD-10-CM

## 2019-10-27 DIAGNOSIS — G459 Transient cerebral ischemic attack, unspecified: Secondary | ICD-10-CM | POA: Insufficient documentation

## 2019-10-27 DIAGNOSIS — K219 Gastro-esophageal reflux disease without esophagitis: Secondary | ICD-10-CM | POA: Diagnosis not present

## 2019-10-27 DIAGNOSIS — R4781 Slurred speech: Secondary | ICD-10-CM | POA: Diagnosis not present

## 2019-10-27 DIAGNOSIS — I10 Essential (primary) hypertension: Secondary | ICD-10-CM | POA: Diagnosis not present

## 2019-10-27 DIAGNOSIS — I6523 Occlusion and stenosis of bilateral carotid arteries: Secondary | ICD-10-CM | POA: Diagnosis not present

## 2019-10-27 DIAGNOSIS — G936 Cerebral edema: Secondary | ICD-10-CM | POA: Diagnosis not present

## 2019-10-27 DIAGNOSIS — R519 Headache, unspecified: Secondary | ICD-10-CM | POA: Insufficient documentation

## 2019-10-27 DIAGNOSIS — E7800 Pure hypercholesterolemia, unspecified: Secondary | ICD-10-CM

## 2019-10-27 DIAGNOSIS — R410 Disorientation, unspecified: Secondary | ICD-10-CM | POA: Diagnosis not present

## 2019-10-27 DIAGNOSIS — R531 Weakness: Secondary | ICD-10-CM | POA: Diagnosis not present

## 2019-10-27 LAB — I-STAT CHEM 8, ED
BUN: 15 mg/dL (ref 8–23)
Calcium, Ion: 1.15 mmol/L (ref 1.15–1.40)
Chloride: 102 mmol/L (ref 98–111)
Creatinine, Ser: 0.6 mg/dL (ref 0.44–1.00)
Glucose, Bld: 92 mg/dL (ref 70–99)
HCT: 43 % (ref 36.0–46.0)
Hemoglobin: 14.6 g/dL (ref 12.0–15.0)
Potassium: 3.7 mmol/L (ref 3.5–5.1)
Sodium: 139 mmol/L (ref 135–145)
TCO2: 27 mmol/L (ref 22–32)

## 2019-10-27 LAB — DIFFERENTIAL
Abs Immature Granulocytes: 0.03 10*3/uL (ref 0.00–0.07)
Basophils Absolute: 0 10*3/uL (ref 0.0–0.1)
Basophils Relative: 0 %
Eosinophils Absolute: 0.2 10*3/uL (ref 0.0–0.5)
Eosinophils Relative: 2 %
Immature Granulocytes: 0 %
Lymphocytes Relative: 21 %
Lymphs Abs: 2.1 10*3/uL (ref 0.7–4.0)
Monocytes Absolute: 0.7 10*3/uL (ref 0.1–1.0)
Monocytes Relative: 7 %
Neutro Abs: 7.1 10*3/uL (ref 1.7–7.7)
Neutrophils Relative %: 70 %

## 2019-10-27 LAB — COMPREHENSIVE METABOLIC PANEL
ALT: 15 U/L (ref 0–44)
AST: 16 U/L (ref 15–41)
Albumin: 3.7 g/dL (ref 3.5–5.0)
Alkaline Phosphatase: 72 U/L (ref 38–126)
Anion gap: 10 (ref 5–15)
BUN: 14 mg/dL (ref 8–23)
CO2: 25 mmol/L (ref 22–32)
Calcium: 9.3 mg/dL (ref 8.9–10.3)
Chloride: 102 mmol/L (ref 98–111)
Creatinine, Ser: 0.67 mg/dL (ref 0.44–1.00)
GFR calc Af Amer: >60 mL/min (ref >60)
GFR calc non Af Amer: >60 mL/min (ref >60)
Glucose, Bld: 96 mg/dL (ref 70–99)
Potassium: 3.8 mmol/L (ref 3.5–5.1)
Sodium: 137 mmol/L (ref 135–145)
Total Bilirubin: 0.2 mg/dL — ABNORMAL LOW (ref 0.3–1.2)
Total Protein: 7.3 g/dL (ref 6.5–8.1)

## 2019-10-27 LAB — CBG MONITORING, ED: Glucose-Capillary: 100 mg/dL — ABNORMAL HIGH (ref 70–99)

## 2019-10-27 LAB — CBC
HCT: 43.3 % (ref 36.0–46.0)
HCT: 41.4 % (ref 36.0–46.0)
Hemoglobin: 13.9 g/dL (ref 12.0–15.0)
Hemoglobin: 13.2 g/dL (ref 12.0–15.0)
MCH: 29.1 pg (ref 26.0–34.0)
MCH: 28.8 pg (ref 26.0–34.0)
MCHC: 32.1 g/dL (ref 30.0–36.0)
MCHC: 31.9 g/dL (ref 30.0–36.0)
MCV: 90.6 fL (ref 80.0–100.0)
MCV: 90.4 fL (ref 80.0–100.0)
Platelets: 318 10*3/uL (ref 150–400)
Platelets: 310 10*3/uL (ref 150–400)
RBC: 4.78 MIL/uL (ref 3.87–5.11)
RBC: 4.58 MIL/uL (ref 3.87–5.11)
RDW: 14.4 % (ref 11.5–15.5)
RDW: 14.5 % (ref 11.5–15.5)
WBC: 10.1 10*3/uL (ref 4.0–10.5)
WBC: 10.4 10*3/uL (ref 4.0–10.5)
nRBC: 0 % (ref 0.0–0.2)
nRBC: 0 % (ref 0.0–0.2)

## 2019-10-27 LAB — CREATININE, SERUM
Creatinine, Ser: 0.68 mg/dL (ref 0.44–1.00)
GFR calc Af Amer: >60 mL/min (ref >60)
GFR calc non Af Amer: >60 mL/min (ref >60)

## 2019-10-27 LAB — PROTIME-INR
INR: 0.9 (ref 0.8–1.2)
Prothrombin Time: 12.2 seconds (ref 11.4–15.2)

## 2019-10-27 LAB — APTT: aPTT: 30 seconds (ref 24–36)

## 2019-10-27 LAB — SARS CORONAVIRUS 2 BY RT PCR (HOSPITAL ORDER, PERFORMED IN ~~LOC~~ HOSPITAL LAB): SARS Coronavirus 2: NEGATIVE

## 2019-10-27 MED ORDER — ATORVASTATIN CALCIUM 80 MG PO TABS
80.0000 mg | ORAL_TABLET | Freq: Every day | ORAL | Status: DC
  Filled 2019-10-27: qty 1

## 2019-10-27 MED ORDER — IOHEXOL 350 MG/ML SOLN
100.0000 mL | Freq: Once | INTRAVENOUS | Status: AC | PRN
  Administered 2019-10-27: 100 mL via INTRAVENOUS

## 2019-10-27 MED ORDER — ASPIRIN 81 MG PO CHEW
324.0000 mg | CHEWABLE_TABLET | Freq: Once | ORAL | Status: AC
  Administered 2019-10-27: 324 mg via ORAL
  Filled 2019-10-27: qty 4

## 2019-10-27 MED ORDER — LABETALOL HCL 5 MG/ML IV SOLN
10.0000 mg | Freq: Once | INTRAVENOUS | Status: DC
  Filled 2019-10-27: qty 4

## 2019-10-27 MED ORDER — ACETAMINOPHEN 325 MG PO TABS: 650.0000 mg | ORAL_TABLET | ORAL | Status: DC | PRN

## 2019-10-27 MED ORDER — ACETAMINOPHEN 160 MG/5ML PO SOLN: 650.0000 mg | ORAL | Status: DC | PRN

## 2019-10-27 MED ORDER — ENOXAPARIN SODIUM 40 MG/0.4ML ~~LOC~~ SOLN
40.0000 mg | SUBCUTANEOUS | Status: DC
  Administered 2019-10-27: 40 mg via SUBCUTANEOUS
  Filled 2019-10-27: qty 0.4

## 2019-10-27 MED ORDER — ACETAMINOPHEN 650 MG RE SUPP: 650.0000 mg | RECTAL | Status: DC | PRN

## 2019-10-27 MED ORDER — STROKE: EARLY STAGES OF RECOVERY BOOK: Freq: Once | Status: DC

## 2019-10-27 NOTE — Consult Note (Signed)
Requesting Physician: Dr. Jacqulyn Bath    Chief Complaint: Aphasia/confusion  History obtained from: Patient and Chart     HPI:                                                                                                                                       Kerry Richardson is a 79 y.o. female with past medical history significant for hypertension, obesity presents to the emergency department with confusion and difficulty finding words that started on Monday.  According to the patient, she has severe headache on Sunday and then noticed she had difficulty with language and getting words out the following morning.  She went to her PCP who recommended patient get MRI and carotid Doppler.  MRI confirmed early sub-acute ischemic infarcts in the left MCA territory.  Carotid Doppler showed no significant stenosis.  Patient was sent to the emergency department for further evaluation and management.  On assessment, patient speech is improved and she is close to her baseline.  Neurology was consulted for further recommendations  Date last known well: 10/22/19 tPA Given:  No, outside window NIHSS: 0 Baseline MRS   Past Medical History:  Diagnosis Date  . Allergy    chronic  . Alpha galactosidase deficiency   . Arthritis   . Back pain   . Bulging discs   . Bursitis   . DDD (degenerative disc disease)   . Dyspnea    with exertion  . GERD (gastroesophageal reflux disease)    mild  . Glaucoma   . H/O removal of cyst    finger (uncertain of which finger)  . Migraine headache   . Neck pain   . PONV (postoperative nausea and vomiting)   . Reflux   . Seasonal allergies     Past Surgical History:  Procedure Laterality Date  . ABDOMINAL HYSTERECTOMY    . CARPAL TUNNEL RELEASE     bilateral  . CATARACT EXTRACTION W/PHACO Left 10/01/2014   Procedure: CATARACT EXTRACTION PHACO AND INTRAOCULAR LENS PLACEMENT LEFT EYE;  Surgeon: Gemma Payor, MD;  Location: [facility];  Service: Ophthalmology;  Laterality:  Left;  CDE:8.15  . CATARACT EXTRACTION W/PHACO Right 08/02/2017   Procedure: CATARACT EXTRACTION WITH PHACOEMULSIFICATION  AND INTRAOCULAR LENS PLACEMENT AND PLACEMENT OF iSTENT GLAUCOMA DEVICE RIGHT EYE;  Surgeon: Gemma Payor, MD;  Location: [facility];  Service: Ophthalmology;  Laterality: Right;  CDE: 7.77  . CERVICAL FUSION    . CHOLECYSTECTOMY N/A 05/15/2013   Procedure: LAPAROSCOPIC CHOLECYSTECTOMY WITH INTRAOPERATIVE CHOLANGIOGRAM;  Surgeon: Dalia Heading, MD;  Location: [facility];  Service: General;  Laterality: N/A;  . CHONDROPLASTY  03/14/2012   Procedure: CHONDROPLASTY;  Surgeon: Vickki Hearing, MD;  Location: [facility];  Service: Orthopedics;  Laterality: Left;  medial condyle  . CHONDROPLASTY Right 12/23/2012   Procedure: KNEE ARTHROSCOPY WITH CHONDROPLASTY WITH LIMITED DEBRIDMENT;  Surgeon: Vickki Hearing, MD;  Location: [facility];  Service: Orthopedics;  Laterality: Right;  . FOOT SURGERY Bilateral    morton's neuroma  . KNEE ARTHROSCOPY WITH MEDIAL MENISECTOMY  03/14/2012   Procedure: KNEE ARTHROSCOPY WITH MEDIAL MENISECTOMY;  Surgeon: Vickki HearingStanley E Harrison, MD;  Location: [facility];  Service: Orthopedics;  Laterality: Left;    Family History  Problem Relation Age of Onset  . Hypertension Mother        borderline   Social History:  reports that she has never smoked. She has never used smokeless tobacco. She reports that she does not drink alcohol and does not use drugs.  Allergies:  Allergies  Allergen Reactions  . Codeine Nausea Only    Medications:                                                                                                                        I reviewed home medications   ROS:                                                                                                                                     14 systems reviewed and negative except above    Examination:                                                                                                       General: Appears well-developed . Psych: Affect appropriate to situation Eyes: No scleral injection HENT: No OP obstrucion Head: Normocephalic.  Cardiovascular: Normal rate and regular rhythm.  Respiratory: Effort normal and breath sounds normal to anterior ascultation GI: Soft.  No distension. There is no tenderness.  Skin: WDI    Neurological Examination Mental Status: Alert, oriented, thought content appropriate.  Speech fluent without evidence of aphasia. Able to follow 3 step commands without difficulty. Cranial Nerves: II: Visual fields grossly normal,  III,IV, VI: ptosis not present, extra-ocular motions intact bilaterally, pupils equal, round, reactive to light and accommodation V,VII: smile symmetric, facial light touch sensation normal bilaterally  VIII: hearing normal bilaterally IX,X: uvula rises symmetrically XI: bilateral shoulder shrug XII: midline tongue extension Motor: Right : Upper extremity   5/5    Left:     Upper extremity   5/5  Lower extremity   5/5     Lower extremity   5/5 Tone and bulk:normal tone throughout; no atrophy noted Sensory: Pinprick and light touch intact throughout, bilaterally Deep Tendon Reflexes: 2+ and symmetric throughout Plantars: Right: downgoing   Left: downgoing Cerebellar: normal finger-to-nose, normal rapid alternating movements and normal heel-to-shin test Gait: normal gait and station     Lab Results: Basic Metabolic Panel: Recent Labs  Lab 10/25/19 1100 10/27/19 1436 10/27/19 1454 10/27/19 1612  NA 138 137 139  --   K 3.9 3.8 3.7  --   CL 102 102 102  --   CO2 24 25  --   --   GLUCOSE 97 96 92  --   BUN 21 14 15   --   CREATININE 0.61 0.67 0.60 0.68  CALCIUM 9.5 9.3  --   --     CBC: Recent Labs  Lab 10/25/19 1100 10/27/19 1436 10/27/19 1454 10/27/19 1612  WBC 9.6 10.1  --  10.4  NEUTROABS 6.9 7.1  --   --   HGB 13.4 13.9 14.6 13.2  HCT 42.8 43.3 43.0 41.4  MCV 92.0 90.6  --  90.4  PLT  337 318  --  310    Coagulation Studies: Recent Labs    10/27/19 1436  LABPROT 12.2  INR 0.9    Imaging: MR Brain Wo Contrast  Result Date: 10/27/2019 CLINICAL DATA:  Episode of confusion and headache on 10/22/2019. TIA. Aphasia. Elevated blood pressure reading. EXAM: MRI HEAD WITHOUT CONTRAST TECHNIQUE: Multiplanar, multiecho pulse sequences of the brain and surrounding structures were obtained without intravenous contrast. COMPARISON:  Head CT 10/25/2019 FINDINGS: Brain: There are small early subacute infarcts in the posterior/inferior left MCA territory primarily affecting the temporal lobe. There is mild cytotoxic edema without mass effect or hemorrhage. No mass or extra-axial fluid collection is evident. Small T2 hyperintensities in the cerebral white matter bilaterally are nonspecific but compatible with mild chronic small vessel ischemic disease. Mild cerebral atrophy is within normal limits for age. Vascular: Major intracranial vascular flow voids are preserved. Skull and upper cervical spine: No suspicious marrow lesion. Partially visualized disc and facet degeneration in the cervical spine with suspected moderate spinal stenosis and mild cord flattening at C3-4. Sinuses/Orbits: Bilateral cataract extraction. Paranasal sinuses and mastoid air cells are clear. Other: None. IMPRESSION: 1. Small early subacute left MCA territory infarcts. 2. Mild chronic small vessel ischemic disease. These results will be called to the ordering clinician or representative by the Radiology Department at the imaging location. Electronically Signed   By: 12/25/2019 M.D.   On: 10/27/2019 10:27   12/27/2019 Carotid Duplex Bilateral  Result Date: 10/27/2019 CLINICAL DATA:  79 year old female with a history of headache weakness and aphasia EXAM: BILATERAL CAROTID DUPLEX ULTRASOUND TECHNIQUE: 96 scale imaging, color Doppler and duplex ultrasound were performed of bilateral carotid and vertebral arteries in the neck.  COMPARISON:  None. FINDINGS: Criteria: Quantification of carotid stenosis is based on velocity parameters that correlate the residual internal carotid diameter with NASCET-based stenosis levels, using the diameter of the distal internal carotid lumen as the denominator for stenosis measurement. The following velocity measurements were obtained: RIGHT ICA:  Systolic 64 cm/sec, Diastolic 19 cm/sec CCA:  108 cm/sec SYSTOLIC ICA/CCA RATIO:  0.6  ECA:  102 cm/sec LEFT ICA:  Systolic 100 cm/sec, Diastolic 25 cm/sec CCA:  102 cm/sec SYSTOLIC ICA/CCA RATIO:  1.0 ECA:  56 cm/sec Right Brachial SBP: Not acquired Left Brachial SBP: Not acquired RIGHT CAROTID ARTERY: No significant calcified disease of the right common carotid artery. Intermediate waveform maintained. Heterogeneous plaque without significant calcifications at the right carotid bifurcation. Low resistance waveform of the right ICA. No significant tortuosity. RIGHT VERTEBRAL ARTERY: Antegrade flow with low resistance waveform. LEFT CAROTID ARTERY: No significant calcified disease of the left common carotid artery. Intermediate waveform maintained. Heterogeneous plaque at the left carotid bifurcation without significant calcifications. Low resistance waveform of the left ICA. LEFT VERTEBRAL ARTERY:  Antegrade flow with low resistance waveform. IMPRESSION: Color duplex indicates minimal heterogeneous plaque, with no hemodynamically significant stenosis by duplex criteria in the extracranial cerebrovascular circulation. Signed, Yvone Neu. Reyne Dumas, RPVI [facility] Electronically Signed   By: Gilmer Mor D.O.   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  CTA head and neck negative for significant stenosis or atherosclerotic plaques.  Left MCA ischemic infarct Aphasia: Improved Embolic Stroke of undetermined  source (ESUS)  Recommend #Transthoracic Echo, will likely need prolonged heart monitor if A. fib not found during hospitalization. # Start patient on ASA 81mg  and Plavix 75 mg into 3 weeks and then aspirin alone #Start or continue Atorvastatin 40 mg/other high intensity statin # BP goal: normotension # HBAIC and Lipid profile # Telemetry monitoring # Frequent neuro checks # NPO until passes stroke swallow screen  Please page stroke NP  Or  PA  Or MD from 8am -4 pm  as this patient from this time will be  followed by the stroke.   You can look them up on [email or web address]  Password [facility]   Hara Milholland [facility] Pager Number [phone]

## 2019-10-27 NOTE — Telephone Encounter (Signed)
Spoke with Triage at [facility] and gave them information regarding patient.

## 2019-10-27 NOTE — ED Triage Notes (Signed)
Pt reports severe headaches that began Sunday as well as difficulty talking on Monday. pcp sent patient for MRI which revealed "Small early subacute left MCA territory infarcts." patient sent here for further work up. A/ox4, speech clear, face symmetrical, grip strength equal. Pt denies visual changes or difficulty with ambulation.

## 2019-10-27 NOTE — Telephone Encounter (Signed)
Patient with multiple small strokes on MRI Given her headache elevated blood pressure earlier in the week it is concerning that this will be progressive Needs urgent work-up This is not possible as an outpatient May need echo, telemetry, neurology consult for strokes  Patient understands she will proceed to [facility] Triage will be called by staff

## 2019-10-27 NOTE — ED Provider Notes (Signed)
[facility] EMERGENCY DEPARTMENT Provider Note   CSN: [id number] Arrival date & time: 10/27/19  1408     History Chief Complaint  Patient presents with  . Stroke Symptoms    Kerry Richardson is a 79 y.o. female.  HPI   79 year old female referred to the emergency room after she had a outpatient MRI showing early subacute left MCA territory infarct.  This past Sunday she began having headaches which she describes as diffuse pressure.  On Monday she was talking on the phone when she had difficulty speaking.  She knew what she wanted to say but had difficulty forming the words.  Symptoms persisted into Tuesday although they waxed and waned.  She still currently feels like her speech is not quite right.  No acute visual changes, numbness, tingling, loss of strength or problems with balance/coordination.   Past Medical History:  Diagnosis Date  . Allergy    chronic  . Alpha galactosidase deficiency   . Arthritis   . Back pain   . Bulging discs   . Bursitis   . DDD (degenerative disc disease)   . Dyspnea    with exertion  . GERD (gastroesophageal reflux disease)    mild  . Glaucoma   . H/O removal of cyst    finger (uncertain of which finger)  . Migraine headache   . Neck pain   . PONV (postoperative nausea and vomiting)   . Reflux   . Seasonal allergies     Patient Active Problem List   Diagnosis Date Noted  . Seasonal allergic rhinitis 02/22/2017  . ANAPHYLACTIC REACTION TO MAMMAL MEAT- (ALPHA-GAL) 11/30/2014  . Allergic rhinoconjunctivitis 11/30/2014  . Hyponatremia 07/14/2013  . Acute diastolic CHF (congestive heart failure) (HCC) 07/13/2013  . Hypokalemia 07/12/2013  . Unspecified essential hypertension 07/12/2013  . Hypoxemia 07/11/2013  . Acute respiratory failure (HCC) 07/11/2013  . Dyslipidemia 07/10/2013  . Acute pancreatitis 07/09/2013  . Leukocytosis 07/09/2013  . Dyspnea 07/09/2013  . S/P knee surgery 12/26/2012  . Sciatica neuralgia  11/03/2012  . Right knee pain 11/03/2012  . S/P arthroscopy of left knee 05/16/2012  . Osteoarthritis, knee 12/03/2011  . Medial meniscus, posterior horn derangement 11/11/2011  . 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 SPINAL STENOSIS 02/06/2010  . 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 OSTEOARTHRITIS, CARPOMETACARPAL JOINT, RIGHT THUMB 03/05/2008  . BURSITIS, RIGHT KNEE 03/05/2008   Past Surgical History:  Procedure Laterality Date  . ABDOMINAL HYSTERECTOMY    . CARPAL TUNNEL RELEASE     bilateral  . CATARACT EXTRACTION W/PHACO Left 10/01/2014   Procedure: CATARACT EXTRACTION PHACO AND INTRAOCULAR LENS PLACEMENT LEFT EYE;  Surgeon: Gemma Payor, MD;  Location: [facility];  Service: Ophthalmology;  Laterality: Left;  CDE:8.15  . CATARACT EXTRACTION W/PHACO Right 08/02/2017   Procedure: CATARACT EXTRACTION WITH PHACOEMULSIFICATION  AND INTRAOCULAR LENS PLACEMENT AND PLACEMENT OF iSTENT GLAUCOMA DEVICE RIGHT EYE;  Surgeon: Gemma Payor, MD;  Location: [facility];  Service: Ophthalmology;  Laterality: Right;  CDE: 7.77  . CERVICAL FUSION    . CHOLECYSTECTOMY N/A 05/15/2013   Procedure: LAPAROSCOPIC CHOLECYSTECTOMY WITH INTRAOPERATIVE CHOLANGIOGRAM;  Surgeon: Dalia Heading, MD;  Location: [facility];  Service: General;  Laterality: N/A;  . CHONDROPLASTY  03/14/2012   Procedure: CHONDROPLASTY;  Surgeon: Vickki Hearing, MD;  Location: [facility];  Service: Orthopedics;  Laterality: Left;  medial condyle  . CHONDROPLASTY Right 12/23/2012   Procedure: KNEE ARTHROSCOPY WITH CHONDROPLASTY WITH LIMITED DEBRIDMENT;  Surgeon: Vickki Hearing,  MD;  Location: [facility];  Service: Orthopedics;  Laterality: Right;  . FOOT SURGERY Bilateral    morton's neuroma  . KNEE ARTHROSCOPY WITH MEDIAL MENISECTOMY  03/14/2012   Procedure: KNEE ARTHROSCOPY WITH MEDIAL MENISECTOMY;  Surgeon: Vickki HearingStanley E Harrison, MD;  Location: [facility];  Service: Orthopedics;  Laterality: Left;    OB History   No obstetric  history on file.    Family History  Problem Relation Age of Onset  . Hypertension Mother        borderline   Social History   Tobacco Use  . Smoking status: Never Smoker  . Smokeless tobacco: Never Used  Vaping Use  . Vaping Use: Never used  Substance Use Topics  . Alcohol use: No  . Drug use: No   Home Medications Prior to Admission medications   Medication Sig Start Date End Date Taking? Authorizing Provider  amLODipine (NORVASC) 2.5 MG tablet Take 1 tablet (2.5 mg total) by mouth daily. 10/25/19   Babs SciaraLuking, Donaghue A, MD  Cholecalciferol (VITAMIN D) 2000 UNITS CAPS Take 1 capsule by mouth daily.    [provider]  ketoconazole (NIZORAL) 2 % cream APPLY TO AFFECTED AREA TWICE DAILY. 06/05/19   Merlyn AlbertLuking, William S, MD  OVER THE COUNTER MEDICATION Take 2 capsules by mouth daily. [place] Mushroom    [provider]  OVER THE COUNTER MEDICATION Zinc    [provider]  OVER THE COUNTER MEDICATION Niacinamide    [provider]   Allergies    Codeine  Review of Systems   Review of Systems All systems reviewed and negative, other than as noted in HPI.  Physical Exam Updated Vital Signs BP (!) 165/79 (BP Location: Right Arm)   Pulse 74   Temp 98.4 F (36.9 C) (Oral)   Resp 18   SpO2 94%   Physical Exam Vitals and nursing note reviewed.  Constitutional:      General: She is not in acute distress.    Appearance: She is well-developed.  HENT:     Head: Normocephalic and atraumatic.  Eyes:     General:        Right eye: No discharge.        Left eye: No discharge.     Conjunctiva/sclera: Conjunctivae normal.  Cardiovascular:     Rate and Rhythm: Normal rate and regular rhythm.     Heart sounds: Normal heart sounds. No murmur heard.  No friction rub. No gallop.   Pulmonary:     Effort: Pulmonary effort is normal. No respiratory distress.     Breath sounds: Normal breath sounds.  Abdominal:     General: There is no distension.      Palpations: Abdomen is soft.     Tenderness: There is no abdominal tenderness.  Musculoskeletal:        General: No tenderness.     Cervical back: Neck supple.  Skin:    General: Skin is warm and dry.  Neurological:     General: No focal deficit present.     Mental Status: She is alert and oriented to person, place, and time.     Cranial Nerves: No cranial nerve deficit.     Sensory: No sensory deficit.     Motor: No weakness.     Coordination: Coordination normal.     Gait: Gait normal.  Psychiatric:        Behavior: Behavior normal.        Thought Content: Thought content normal.  ED Results / Procedures / Treatments   Labs (all labs ordered are listed, but only abnormal results are displayed) Labs Reviewed  COMPREHENSIVE METABOLIC PANEL - Abnormal; Notable for the following components:      Result Value   Total Bilirubin 0.2 (*)    All other components within normal limits  CBG MONITORING, ED - Abnormal; Notable for the following components:   Glucose-Capillary 100 (*)    All other components within normal limits  PROTIME-INR  APTT  CBC  DIFFERENTIAL  I-STAT CHEM 8, ED    EKG None  Radiology MR Brain Wo Contrast  Result Date: 10/27/2019 CLINICAL DATA:  Episode of confusion and headache on 10/22/2019. TIA. Aphasia. Elevated blood pressure reading. EXAM: MRI HEAD WITHOUT CONTRAST TECHNIQUE: Multiplanar, multiecho pulse sequences of the brain and surrounding structures were obtained without intravenous contrast. COMPARISON:  Head CT 10/25/2019 FINDINGS: Brain: There are small early subacute infarcts in the posterior/inferior left MCA territory primarily affecting the temporal lobe. There is mild cytotoxic edema without mass effect or hemorrhage. No mass or extra-axial fluid collection is evident. Small T2 hyperintensities in the cerebral white matter bilaterally are nonspecific but compatible with mild chronic small vessel ischemic disease. Mild cerebral atrophy is  within normal limits for age. Vascular: Major intracranial vascular flow voids are preserved. Skull and upper cervical spine: No suspicious marrow lesion. Partially visualized disc and facet degeneration in the cervical spine with suspected moderate spinal stenosis and mild cord flattening at C3-4. Sinuses/Orbits: Bilateral cataract extraction. Paranasal sinuses and mastoid air cells are clear. Other: None. IMPRESSION: 1. Small early subacute left MCA territory infarcts. 2. Mild chronic small vessel ischemic disease. These results will be called to the ordering clinician or representative by the Radiology Department at the imaging location. Electronically Signed   By: Sebastian Ache M.D.   On: 10/27/2019 10:27   US Carotid Duplex Bilateral  Result Date: 10/27/2019 CLINICAL DATA:  79 year old female with a history of headache weakness and aphasia EXAM: BILATERAL CAROTID DUPLEX ULTRASOUND TECHNIQUE: Wallace Cullens scale imaging, color Doppler and duplex ultrasound were performed of bilateral carotid and vertebral arteries in the neck. COMPARISON:  None. FINDINGS: Criteria: Quantification of carotid stenosis is based on velocity parameters that correlate the residual internal carotid diameter with NASCET-based stenosis levels, using the diameter of the distal internal carotid lumen as the denominator for stenosis measurement. The following velocity measurements were obtained: RIGHT ICA:  Systolic 64 cm/sec, Diastolic 19 cm/sec CCA:  108 cm/sec SYSTOLIC ICA/CCA RATIO:  0.6 ECA:  102 cm/sec LEFT ICA:  Systolic 100 cm/sec, Diastolic 25 cm/sec CCA:  102 cm/sec SYSTOLIC ICA/CCA RATIO:  1.0 ECA:  56 cm/sec Right Brachial SBP: Not acquired Left Brachial SBP: Not acquired RIGHT CAROTID ARTERY: No significant calcified disease of the right common carotid artery. Intermediate waveform maintained. Heterogeneous plaque without significant calcifications at the right carotid bifurcation. Low resistance waveform of the right ICA. No  significant tortuosity. RIGHT VERTEBRAL ARTERY: Antegrade flow with low resistance waveform. LEFT CAROTID ARTERY: No significant calcified disease of the left common carotid artery. Intermediate waveform maintained. Heterogeneous plaque at the left carotid bifurcation without significant calcifications. Low resistance waveform of the left ICA. LEFT VERTEBRAL ARTERY:  Antegrade flow with low resistance waveform. IMPRESSION: Color duplex indicates minimal heterogeneous plaque, with no hemodynamically significant stenosis by duplex criteria in the extracranial cerebrovascular circulation. Signed, Yvone Neu. Reyne Dumas, RPVI [facility] Radiology Electronically Signed   By: Gilmer Mor D.O.   On:  10/27/2019 10:23    Procedures Procedures (including critical care time)  Medications Ordered in ED Medications - No data to display  ED Course  I have reviewed the triage vital signs and the nursing notes.  Pertinent labs & imaging results that were available during my care of the patient were reviewed by me and considered in my medical decision making (see chart for details).    MDM Rules/Calculators/A&P                          79yF with CVA. Aphasia earlier in the week. She feels like her speech is not back to baseline although it sounds fluent to me currently. Overall, nonfocal exam.    Final Clinical Impression(s) / ED Diagnoses Final diagnoses:  Acute CVA (cerebrovascular accident) [facility])    Rx / DC Orders ED Discharge Orders    None       Raeford Razor, MD 10/30/19 [phone]

## 2019-10-27 NOTE — ED Notes (Signed)
Dr Jacqulyn Bath aware of BP, will follow permissive hypertension protocol of 220/120

## 2019-10-27 NOTE — H&P (Signed)
History and Physical    Kerry Richardson HWE:[id number] DOB: 02-29-1941 DOA: 10/27/2019  PCP: Annalee Genta, DO  Patient coming from: Home  I have personally briefly reviewed patient's old medical records in [facility]  Chief Complaint: Headache since 5 days and slurred speech since 4 days  HPI: Kerry Richardson is a 79 y.o. female with medical history significant of hypertension, obesity presents to emergency department for the evaluation of ischemic stroke  Patient reports severe headache that started on Sunday and difficulty in finding words that started on Monday.  She was seen by his PCP on Wednesday and was sent MRI and carotid Doppler.  She had an MRI and carotid Doppler this morning and was found to have small early subacute left MCA territory infarct therefore her PCP sent to ED for further evaluation and management.  Patient tells me that her headache is better however she still having difficulty in finding words, denies facial drop, loss of consciousness, numbness weakness tingling sensation in bilateral upper or lower extremities, blurry vision, lightheadedness, dizziness, chest pain, shortness of breath, palpitation, leg swelling, fever, chills, nausea, vomiting, abdominal pain, urinary or bowel changes.  She lives alone at home.  No history of smoking, alcohol, licit drug use.  She takes amlodipine for her blood pressure at home.  She takes over-the-counter multivitamins and supplements.  She is independent on daily life activities and does not use walker or cane.  ED Course: Upon arrival to ED: Patient's blood pressure was noted to be elevated, she is on room air, afebrile.  Her initial labs including CBC, CMP, PT/INR, APTT, CBG: WNL, COVID-19 pending.  EDP consulted neurology.  [facility] hospitalist consulted for admission for ischemic stroke.  Review of Systems: As per HPI otherwise negative.    Past Medical History:  Diagnosis Date  . Allergy    chronic  . Alpha  galactosidase deficiency   . Arthritis   . Back pain   . Bulging discs   . Bursitis   . DDD (degenerative disc disease)   . Dyspnea    with exertion  . GERD (gastroesophageal reflux disease)    mild  . Glaucoma   . H/O removal of cyst    finger (uncertain of which finger)  . Migraine headache   . Neck pain   . PONV (postoperative nausea and vomiting)   . Reflux   . Seasonal allergies     Past Surgical History:  Procedure Laterality Date  . ABDOMINAL HYSTERECTOMY    . CARPAL TUNNEL RELEASE     bilateral  . CATARACT EXTRACTION W/PHACO Left 10/01/2014   Procedure: CATARACT EXTRACTION PHACO AND INTRAOCULAR LENS PLACEMENT LEFT EYE;  Surgeon: Gemma Payor, MD;  Location: [facility];  Service: Ophthalmology;  Laterality: Left;  CDE:8.15  . CATARACT EXTRACTION W/PHACO Right 08/02/2017   Procedure: CATARACT EXTRACTION WITH PHACOEMULSIFICATION  AND INTRAOCULAR LENS PLACEMENT AND PLACEMENT OF iSTENT GLAUCOMA DEVICE RIGHT EYE;  Surgeon: Gemma Payor, MD;  Location: [facility];  Service: Ophthalmology;  Laterality: Right;  CDE: 7.77  . CERVICAL FUSION    . CHOLECYSTECTOMY N/A 05/15/2013   Procedure: LAPAROSCOPIC CHOLECYSTECTOMY WITH INTRAOPERATIVE CHOLANGIOGRAM;  Surgeon: Dalia Heading, MD;  Location: [facility];  Service: General;  Laterality: N/A;  . CHONDROPLASTY  03/14/2012   Procedure: CHONDROPLASTY;  Surgeon: Vickki Hearing, MD;  Location: [facility];  Service: Orthopedics;  Laterality: Left;  medial condyle  . CHONDROPLASTY Right 12/23/2012   Procedure: KNEE ARTHROSCOPY WITH CHONDROPLASTY WITH LIMITED DEBRIDMENT;  Surgeon: Vickki Hearing, MD;  Location: [facility];  Service: Orthopedics;  Laterality: Right;  . FOOT SURGERY Bilateral    morton's neuroma  . KNEE ARTHROSCOPY WITH MEDIAL MENISECTOMY  03/14/2012   Procedure: KNEE ARTHROSCOPY WITH MEDIAL MENISECTOMY;  Surgeon: Vickki Hearing, MD;  Location: [facility];  Service: Orthopedics;  Laterality: Left;     reports that she has never smoked. She  has never used smokeless tobacco. She reports that she does not drink alcohol and does not use drugs.  Allergies  Allergen Reactions  . Codeine Nausea Only    Family History  Problem Relation Age of Onset  . Hypertension Mother        borderline    Prior to Admission medications   Medication Sig Start Date End Date Taking? Authorizing Provider  amLODipine (NORVASC) 2.5 MG tablet Take 1 tablet (2.5 mg total) by mouth daily. 10/25/19   Babs Sciara, MD  Cholecalciferol (VITAMIN D) 2000 UNITS CAPS Take 1 capsule by mouth daily.    [provider]  ketoconazole (NIZORAL) 2 % cream APPLY TO AFFECTED AREA TWICE DAILY. 06/05/19   Merlyn Albert, MD  OVER THE COUNTER MEDICATION Take 2 capsules by mouth daily. [place] Mushroom    [provider]  OVER THE COUNTER MEDICATION Zinc    [provider]  OVER THE COUNTER MEDICATION Niacinamide    [provider]    Physical Exam: Vitals:   10/27/19 1419 10/27/19 1508  BP: (!) 194/93 (!) 165/79  Pulse: 82 74  Resp: 18 18  Temp: 98.4 F (36.9 C)   TempSrc: Oral   SpO2: 96% 94%    Constitutional: NAD, calm, comfortable, on room air, obese Eyes: PERRL, lids and conjunctivae normal ENMT: Mucous membranes are moist. Posterior pharynx clear of any exudate or lesions.Normal dentition.  Neck: normal, supple, no masses, no thyromegaly Respiratory: clear to auscultation bilaterally, no wheezing, no crackles. Normal respiratory effort. No accessory muscle use.  Cardiovascular: Regular rate and rhythm, no murmurs / rubs / gallops. No extremity edema. 2+ pedal pulses. No carotid bruits.  Abdomen: no tenderness, no masses palpated. No hepatosplenomegaly. Bowel sounds positive.  Musculoskeletal: no clubbing / cyanosis. No joint deformity upper and lower extremities. Good ROM, no contractures. Normal muscle tone.  Skin: no rashes, lesions, ulcers. No induration Neurologic: CN 2-12 grossly intact. Sensation  intact, DTR normal. Strength 5/5 in all 4.  Psychiatric: Normal judgment and insight. Alert and oriented x 3. Normal mood.    Labs on Admission: I have personally reviewed following labs and imaging studies  CBC: Recent Labs  Lab 10/25/19 1100 10/27/19 1436 10/27/19 1454  WBC 9.6 10.1  --   NEUTROABS 6.9 7.1  --   HGB 13.4 13.9 14.6  HCT 42.8 43.3 43.0  MCV 92.0 90.6  --   PLT 337 318  --    Basic Metabolic Panel: Recent Labs  Lab 10/25/19 1100 10/27/19 1436 10/27/19 1454  NA 138 137 139  K 3.9 3.8 3.7  CL 102 102 102  CO2 24 25  --   GLUCOSE 97 96 92  BUN 21 14 15   CREATININE 0.61 0.67 0.60  CALCIUM 9.5 9.3  --    GFR: Estimated Creatinine Clearance: 58.3 mL/min (by C-G formula based on SCr of 0.6 mg/dL). Liver Function Tests: Recent Labs  Lab 10/27/19 1436  AST 16  ALT 15  ALKPHOS 72  BILITOT 0.2*  PROT 7.3  ALBUMIN 3.7   No results  for input(s): LIPASE, AMYLASE in the last 168 hours. No results for input(s): AMMONIA in the last 168 hours. Coagulation Profile: Recent Labs  Lab 10/27/19 1436  INR 0.9   Cardiac Enzymes: No results for input(s): CKTOTAL, CKMB, CKMBINDEX, TROPONINI in the last 168 hours. BNP (last 3 results) No results for input(s): PROBNP in the last 8760 hours. HbA1C: No results for input(s): HGBA1C in the last 72 hours. CBG: Recent Labs  Lab 10/27/19 1550  GLUCAP 100*   Lipid Profile: No results for input(s): CHOL, HDL, LDLCALC, TRIG, CHOLHDL, LDLDIRECT in the last 72 hours. Thyroid Function Tests: No results for input(s): TSH, T4TOTAL, FREET4, T3FREE, THYROIDAB in the last 72 hours. Anemia Panel: No results for input(s): VITAMINB12, FOLATE, FERRITIN, TIBC, IRON, RETICCTPCT in the last 72 hours. Urine analysis:    Component Value Date/Time   COLORURINE YELLOW 07/09/2013 1514   APPEARANCEUR CLEAR 07/09/2013 1514   LABSPEC 1.010 07/09/2013 1514   PHURINE 6.0 07/09/2013 1514   GLUCOSEU NEGATIVE 07/09/2013 1514   HGBUR  TRACE (A) 07/09/2013 1514   BILIRUBINUR NEGATIVE 07/09/2013 1514   KETONESUR NEGATIVE 07/09/2013 1514   PROTEINUR 30 01/31/2015 1639   PROTEINUR NEGATIVE 07/09/2013 1514   UROBILINOGEN 0.2 07/09/2013 1514   NITRITE NEGATIVE 07/09/2013 1514   LEUKOCYTESUR small (1+) (A) 01/31/2015 1639    Radiological Exams on Admission: MR Brain Wo Contrast  Result Date: 10/27/2019 CLINICAL DATA:  Episode of confusion and headache on 10/22/2019. TIA. Aphasia. Elevated blood pressure reading. EXAM: MRI HEAD WITHOUT CONTRAST TECHNIQUE: Multiplanar, multiecho pulse sequences of the brain and surrounding structures were obtained without intravenous contrast. COMPARISON:  Head CT 10/25/2019 FINDINGS: Brain: There are small early subacute infarcts in the posterior/inferior left MCA territory primarily affecting the temporal lobe. There is mild cytotoxic edema without mass effect or hemorrhage. No mass or extra-axial fluid collection is evident. Small T2 hyperintensities in the cerebral white matter bilaterally are nonspecific but compatible with mild chronic small vessel ischemic disease. Mild cerebral atrophy is within normal limits for age. Vascular: Major intracranial vascular flow voids are preserved. Skull and upper cervical spine: No suspicious marrow lesion. Partially visualized disc and facet degeneration in the cervical spine with suspected moderate spinal stenosis and mild cord flattening at C3-4. Sinuses/Orbits: Bilateral cataract extraction. Paranasal sinuses and mastoid air cells are clear. Other: None. IMPRESSION: 1. Small early subacute left MCA territory infarcts. 2. Mild chronic small vessel ischemic disease. These results will be called to the ordering clinician or representative by the Radiology Department at the imaging location. Electronically Signed   By: Sebastian Ache M.D.   On: 10/27/2019 10:27   US Carotid Duplex Bilateral  Result Date: 10/27/2019 CLINICAL DATA:  79 year old female with a history of  headache weakness and aphasia EXAM: BILATERAL CAROTID DUPLEX ULTRASOUND TECHNIQUE: Wallace Cullens scale imaging, color Doppler and duplex ultrasound were performed of bilateral carotid and vertebral arteries in the neck. COMPARISON:  None. FINDINGS: Criteria: Quantification of carotid stenosis is based on velocity parameters that correlate the residual internal carotid diameter with NASCET-based stenosis levels, using the diameter of the distal internal carotid lumen as the denominator for stenosis measurement. The following velocity measurements were obtained: RIGHT ICA:  Systolic 64 cm/sec, Diastolic 19 cm/sec CCA:  108 cm/sec SYSTOLIC ICA/CCA RATIO:  0.6 ECA:  102 cm/sec LEFT ICA:  Systolic 100 cm/sec, Diastolic 25 cm/sec CCA:  102 cm/sec SYSTOLIC ICA/CCA RATIO:  1.0 ECA:  56 cm/sec Right Brachial SBP: Not acquired Left Brachial SBP: Not acquired RIGHT CAROTID  ARTERY: No significant calcified disease of the right common carotid artery. Intermediate waveform maintained. Heterogeneous plaque without significant calcifications at the right carotid bifurcation. Low resistance waveform of the right ICA. No significant tortuosity. RIGHT VERTEBRAL ARTERY: Antegrade flow with low resistance waveform. LEFT CAROTID ARTERY: No significant calcified disease of the left common carotid artery. Intermediate waveform maintained. Heterogeneous plaque at the left carotid bifurcation without significant calcifications. Low resistance waveform of the left ICA. LEFT VERTEBRAL ARTERY:  Antegrade flow with low resistance waveform. IMPRESSION: Color duplex indicates minimal heterogeneous plaque, with no hemodynamically significant stenosis by duplex criteria in the extracranial cerebrovascular circulation. Signed, Yvone NeuJaime S. Reyne DumasWagner, DO, RPVI [facility] Electronically Signed   By: Gilmer MorJaime  Wagner D.O.   On: 10/27/2019 10:23    EKG: Independently reviewed.  Sinus rhythm, LVH, no ST  elevation or depression noted.  Assessment/Plan Principal Problem:   Ischemic stroke (HCC) Active Problems:   HTN (hypertension)    Left MCA infarct: -admit forTelemetry monitoring -Stroke protocol -Allow for permissive hypertension for the first 24-48h - only treat PRN if SBP >220 mmHg or diastolic blood pressure >120. Blood pressures can be gradually normalized to SBP<140 upon discharge. -Reviewed MRI brain and carotid Doppler ultrasound result  -Order lipid panel, A1c, transthoracic echo -Frequent neuro checks -Aspirin given.  Start patient on atorvastatin. -Consult Neurology-await recommendation -PT/OT eval, Speech consult -We will keep her n.p.o. until she passes the bedside swallow evaluation  Hypertension: Hold amlodipine to allow permissive hypertension.  Monitor blood pressure closely  Obesity with BMI of 41.  Encourage dietary modification, exercise and weight loss  DVT prophylaxis: Lovenox/SCD Code Status: Full code Family Communication: Patient's son present at bedside.  Plan of care discussed with patient in length and she verbalized understanding and agreed with it. Disposition Plan: Likely home tomorrow a.m. Consults called: Neurology by EDP Admission status: Observation   Ollen Bowlinka R Chriss Mannan MD [facility]  If 7PM-7AM, please contact night-coverage [email or web address] Password [email or web address]  10/27/2019, 4:14 PM

## 2019-10-28 ENCOUNTER — Observation Stay (HOSPITAL_BASED_OUTPATIENT_CLINIC_OR_DEPARTMENT_OTHER): Payer: Medicare Other

## 2019-10-28 ENCOUNTER — Other Ambulatory Visit: Payer: Self-pay | Admitting: Neurology

## 2019-10-28 DIAGNOSIS — I10 Essential (primary) hypertension: Secondary | ICD-10-CM | POA: Diagnosis not present

## 2019-10-28 DIAGNOSIS — I351 Nonrheumatic aortic (valve) insufficiency: Secondary | ICD-10-CM | POA: Diagnosis not present

## 2019-10-28 DIAGNOSIS — I639 Cerebral infarction, unspecified: Secondary | ICD-10-CM | POA: Diagnosis not present

## 2019-10-28 DIAGNOSIS — I361 Nonrheumatic tricuspid (valve) insufficiency: Secondary | ICD-10-CM | POA: Diagnosis not present

## 2019-10-28 DIAGNOSIS — E7800 Pure hypercholesterolemia, unspecified: Secondary | ICD-10-CM

## 2019-10-28 LAB — ECHOCARDIOGRAM COMPLETE
Area-P 1/2: 2.83 cm2
Height: 60 in
P 1/2 time: 674 msec
S' Lateral: 2.5 cm
Weight: 3386.27 oz

## 2019-10-28 LAB — HEMOGLOBIN A1C
Hgb A1c MFr Bld: 6.2 % — ABNORMAL HIGH (ref 4.8–5.6)
Mean Plasma Glucose: 131.24 mg/dL

## 2019-10-28 LAB — LIPID PANEL
Cholesterol: 225 mg/dL — ABNORMAL HIGH (ref 0–200)
HDL: 62 mg/dL (ref >40)
LDL Cholesterol: 135 mg/dL — ABNORMAL HIGH (ref 0–99)
Total CHOL/HDL Ratio: 3.6 RATIO
Triglycerides: 139 mg/dL (ref <150)
VLDL: 28 mg/dL (ref 0–40)

## 2019-10-28 MED ORDER — AMLODIPINE BESYLATE 5 MG PO TABS: 5.0000 mg | ORAL_TABLET | Freq: Every day | ORAL | 3 refills | Status: DC

## 2019-10-28 MED ORDER — ASPIRIN EC 81 MG PO TBEC
81.0000 mg | DELAYED_RELEASE_TABLET | Freq: Every day | ORAL | Status: DC
  Administered 2019-10-28: 81 mg via ORAL
  Filled 2019-10-28: qty 1

## 2019-10-28 MED ORDER — ATORVASTATIN CALCIUM 40 MG PO TABS
40.0000 mg | ORAL_TABLET | Freq: Every day | ORAL | Status: DC
  Filled 2019-10-28: qty 1

## 2019-10-28 MED ORDER — ATORVASTATIN CALCIUM 40 MG PO TABS: 40.0000 mg | ORAL_TABLET | Freq: Every day | ORAL | 2 refills | Status: DC

## 2019-10-28 MED ORDER — CLOPIDOGREL BISULFATE 75 MG PO TABS: 75.0000 mg | ORAL_TABLET | Freq: Every day | ORAL | 0 refills | Status: AC

## 2019-10-28 MED ORDER — AMLODIPINE BESYLATE 5 MG PO TABS
5.0000 mg | ORAL_TABLET | Freq: Every day | ORAL | Status: DC
  Administered 2019-10-28: 5 mg via ORAL
  Filled 2019-10-28: qty 1

## 2019-10-28 MED ORDER — CLOPIDOGREL BISULFATE 75 MG PO TABS
75.0000 mg | ORAL_TABLET | Freq: Every day | ORAL | Status: DC
  Administered 2019-10-28: 75 mg via ORAL
  Filled 2019-10-28: qty 1

## 2019-10-28 MED ORDER — ASPIRIN 81 MG PO TBEC: 81.0000 mg | DELAYED_RELEASE_TABLET | Freq: Every day | ORAL | 11 refills | Status: AC

## 2019-10-28 NOTE — Evaluation (Signed)
Physical Therapy Evaluation and Discharge Patient Details Name: Kerry Richardson MRN: [id number] DOB: November 14, 1940 Today's Date: 10/28/2019   History of Present Illness  This 79 y.o. female admitted from PCP office with mild confusion and word finding difficulties.  MRI of brain showed:  small early subacute MCA territory infarcts, mild chronic small vessel ischemic disease.  CTA negative for LVO or carotid and vertebral artery stenosis. PMH includes:   migraine headaches; DOE, bulging discs with back pain, s/p cervical fusion, s/p CTR.   Clinical Impression  Patient evaluated by Physical Therapy with no further acute PT needs identified. All education has been completed and the patient has no further questions. At the time of PT eval pt was able to perform transfers and ambulation with gross modified independence and no AD. Pt complaining of back pain and DOE. During gait training, sats dropped to 93% on RA and HR increased to 113 bpm with 3/4 DOE. She recovered fairly quickly with seated rest break. Pt was instructed in trunk stabilization exercise and some basic strengthening activity. Recommend follow-up with outpatient PT to maximize functional independence, increase tolerance for functional activity, and decrease risk for falls. PT is signing off. Thank you for this referral.     Follow Up Recommendations Outpatient PT    Equipment Recommendations  None recommended by PT    Recommendations for Other Services       Precautions / Restrictions Precautions Precautions: None Precaution Comments: DOE Restrictions Weight Bearing Restrictions: No      Mobility  Bed Mobility Overal bed mobility: Independent                Transfers Overall transfer level: Independent Equipment used: None                Ambulation/Gait Ambulation/Gait assistance: Modified independent (Device/Increase time) Gait Distance (Feet): 175 Feet Assistive device: None Gait Pattern/deviations:  Step-through pattern;Decreased stride length;Wide base of support Gait velocity: Decreased Gait velocity interpretation: <1.8 ft/sec, indicate of risk for recurrent falls General Gait Details: Slow with large amplitude weight shifting (not quite a trendelenberg gait pattern). Pt reports back pain and fatigue after ~85' and we returned to the room.   Stairs            Wheelchair Mobility    Modified Rankin (Stroke Patients Only) Modified Rankin (Stroke Patients Only) Pre-Morbid Rankin Score: No symptoms Modified Rankin: Moderate disability     Balance Overall balance assessment: Mild deficits observed, not formally tested                                           Pertinent Vitals/Pain Pain Assessment: Faces Faces Pain Scale: Hurts little more Pain Location: back  Pain Descriptors / Indicators: Aching;Grimacing;Guarding Pain Intervention(s): Limited activity within patient's tolerance;Monitored during session;Repositioned    Home Living Family/patient expects to be discharged to:: Private residence Living Arrangements: Alone   Type of Home: House Home Access: Stairs to enter Entrance Stairs-Rails: Doctor, general practice of Steps: 5 Home Layout: Two level;Able to live on main level with bedroom/bathroom Home Equipment: None      Prior Function Level of Independence: Independent         Comments: Pt reports she is fully independent, but does modify her activity due to chronic back pain.  She denies h/o falls.  uses a grocery cart in the grocery store due to back pain  Hand Dominance   Dominant Hand: Right    Extremity/Trunk Assessment   Upper Extremity Assessment Upper Extremity Assessment: Overall WFL for tasks assessed    Lower Extremity Assessment Lower Extremity Assessment: Overall WFL for tasks assessed (5/5 strength in hamstrings, quads, hip flexors, DF)    Cervical / Trunk Assessment Cervical / Trunk Assessment:  Lordotic  Communication   Communication: Expressive difficulties (Pt reports mild word finding difficulties )  Cognition Arousal/Alertness: Awake/alert Behavior During Therapy: WFL for tasks assessed/performed Overall Cognitive Status: Within Functional Limits for tasks assessed                                        General Comments      Exercises Other Exercises Other Exercises: Medbridge Access Code [id number] Other Exercises: Pt instructed in posterior pelvic tilts, pelvic tilts with leg lifts, abdominal bracing, LAQ, standing mini squats and hamstring curls.    Assessment/Plan    PT Assessment Patent does not need any further PT services  PT Problem List         PT Treatment Interventions      PT Goals (Current goals can be found in the Care Plan section)  Acute Rehab PT Goals Patient Stated Goal: to go home and sleep  PT Goal Formulation: All assessment and education complete, DC therapy    Frequency     Barriers to discharge        Co-evaluation               AM-PAC PT "6 Clicks" Mobility  Outcome Measure Help needed turning from your back to your side while in a flat bed without using bedrails?: None Help needed moving from lying on your back to sitting on the side of a flat bed without using bedrails?: None Help needed moving to and from a bed to a chair (including a wheelchair)?: None Help needed standing up from a chair using your arms (e.g., wheelchair or bedside chair)?: None Help needed to walk in hospital room?: None Help needed climbing 3-5 steps with a railing? : None 6 Click Score: 24    End of Session Equipment Utilized During Treatment: Gait belt Activity Tolerance: Patient tolerated treatment well Patient left: in bed;with call bell/phone within reach;Other (comment) (MD present) Nurse Communication: Mobility status PT Visit Diagnosis: Other symptoms and signs involving the nervous system (R29.898)    Time:  1610-9604 PT Time Calculation (min) (ACUTE ONLY): 22 min   Charges:   PT Evaluation $PT Eval Low Complexity: 1 Low          Conni Slipper, PT, DPT Acute Rehabilitation Services Pager: [phone] Office: [phone]   Kerry Richardson 10/28/2019, 2:58 PM

## 2019-10-28 NOTE — Discharge Summary (Addendum)
Physician Discharge Summary  MAX ROMANO ZOX:[id number] DOB: January 07, 1941 DOA: 10/27/2019  PCP: Annalee Genta, DO  Admit date: 10/27/2019 Discharge date: 10/28/2019  Time spent: 50 minutes  Recommendations for Outpatient Follow-up:  1. Follow-up cardiology in 1 week for outpatient event monitor 2. Outpatient PT   Discharge Diagnoses:  Principal Problem:   Ischemic stroke [facility]) Active Problems:   HTN (hypertension)   Discharge Condition: Stable  Diet recommendation: Heart healthy diet  Filed Weights   10/27/19 2131  Weight: 96 kg    History of present illness:  79 year old female with history of hypertension, obesity came with difficulty in finding words started Monday. She was seen by her PCP on Wednesday and was sent for MRI and color Doppler. She was found to have subacute left MCA territory infarct and was sent to ED for further evaluation.  Hospital Course:  Left MCA infarct-seen on MRI brain. Echocardiogram did not show cardiac source of emboli, carotid duplex showed minimal heterogeneous plaques with no hemodynamically significant stenosis, CT head and neck showed no large vessel obstruction. Neurology has signed off on patient. There are no significant deficits. Patient be discharged on aspirin and Plavix for 3 weeks and then after that she can stop Plavix and continue with aspirin daily. Patient's blood pressure medication Norvasc has been changed to 5 mg p.o. daily. Continue Lipitor 40 mg p.o. daily. Outpatient event monitor has been set up with cardiology next week.  Procedures:  Echocardiogram  Carotid duplex  Consultations:  Neurology  Discharge Exam: Vitals:   10/28/19 0755 10/28/19 1143  BP: (!) 158/54 (!) 163/61  Pulse: 76 74  Resp: 16 18  Temp: 98 F (36.7 C) 98.4 F (36.9 C)  SpO2: 98% 95%    General: Appears in no acute distress Cardiovascular: S1-S2, regular, no murmur auscultated Respiratory: Clear to auscultation bilaterally  Discharge  Instructions   Discharge Instructions    Diet - low sodium heart healthy   Complete by: As directed    Discharge instructions   Complete by: As directed    Take Aspirin and Plavix together for 3 weeks, then stop Plavix and continue taking Aspirin daily.   Increase activity slowly   Complete by: As directed      Allergies as of 10/28/2019      Reactions   Codeine Nausea Only      Medication List    STOP taking these medications   ibuprofen 200 MG tablet Commonly known as: ADVIL     TAKE these medications   amLODipine 5 MG tablet Commonly known as: NORVASC Take 1 tablet (5 mg total) by mouth daily. Start taking on: October 29, 2019 What changed:   medication strength  how much to take   aspirin 81 MG EC tablet Take 1 tablet (81 mg total) by mouth daily. Swallow whole. Start taking on: October 29, 2019   atorvastatin 40 MG tablet Commonly known as: LIPITOR Take 1 tablet (40 mg total) by mouth daily. Start taking on: October 29, 2019   clopidogrel 75 MG tablet Commonly known as: PLAVIX Take 1 tablet (75 mg total) by mouth daily for 21 days. Start taking on: October 29, 2019   ketoconazole 2 % cream Commonly known as: NIZORAL APPLY TO AFFECTED AREA TWICE DAILY. What changed: See the new instructions.   NIACINAMIDE PO Take 1 tablet by mouth daily.   OVER THE COUNTER MEDICATION Take 2 capsules by mouth daily. [place] Mushroom   triamcinolone cream 0.1 % Commonly known  as: KENALOG Apply 1 application topically 2 (two) times daily as needed (itching).   Vitamin D 125 MCG (5000 UT) Caps Take 5,000 Units by mouth daily.   Zinc 50 MG Tabs Take 50 mg by mouth daily.      Allergies  Allergen Reactions  . Codeine Nausea Only    Follow-up Information    Hillis Range, MD Follow up in 1 week(s).   Specialty: Cardiology Contact information: [address] [place] [address] [phone]        [facility]. Schedule an  appointment as soon as possible for a visit in 4 week(s).   Specialty: Neurology Contact information: [address] [place] [address] [phone]               The results of significant diagnostics from this hospitalization (including imaging, microbiology, ancillary and laboratory) are listed below for reference.    Significant Diagnostic Studies: CT Head Wo Contrast  Result Date: 10/25/2019 CLINICAL DATA:  Sudden onset acute headache for 4 days EXAM: CT HEAD WITHOUT CONTRAST TECHNIQUE: Contiguous axial images were obtained from the base of the skull through the vertex without intravenous contrast. COMPARISON:  None. FINDINGS: Brain: Minor age related brain atrophy and minimal white matter microvascular ischemic changes about the ventricles. No acute intracranial hemorrhage, mass lesion, new infarction, midline shift, herniation, hydrocephalus, or extra-axial fluid collection. No focal mass effect or edema. Cisterns are patent. No cerebellar abnormality. Vascular: No hyperdense vessel or unexpected calcification. Skull: Normal. Negative for fracture or focal lesion. Sinuses/Orbits: No acute finding. Other: None. IMPRESSION: Minor atrophy and white matter microvascular ischemic changes. No acute intracranial abnormality by noncontrast CT. Electronically Signed   By: Judie Petit.  Shick M.D.   On: 10/25/2019 10:49   CT ANGIO NECK W OR WO CONTRAST  Result Date: 10/27/2019 CLINICAL DATA:  Stroke EXAM: CT ANGIOGRAPHY HEAD AND NECK TECHNIQUE: Multidetector CT imaging of the head and neck was performed using the standard protocol during bolus administration of intravenous contrast. Multiplanar CT image reconstructions and MIPs were obtained to evaluate the vascular anatomy. Carotid stenosis measurements (when applicable) are obtained utilizing NASCET criteria, using the distal internal carotid diameter as the denominator. CONTRAST:  OMNIPAQUE IOHEXOL 350 MG/ML SOLN  COMPARISON:  MRI head 10/27/2019 FINDINGS: CT HEAD FINDINGS Brain: Ill-defined small hypodensity in the left posterior temporal lobe corresponding to acute or subacute infarct on diffusion-weighted imaging. No other areas of infarct. Negative for hemorrhage or mass. Negative for hydrocephalus. Vascular: Negative for hyperdense vessel Skull: Negative Sinuses: Paranasal sinuses clear. Orbits: Bilateral cataract extraction.  No orbital lesion. Review of the MIP images confirms the above findings CTA NECK FINDINGS Aortic arch: Mild atherosclerotic calcification aortic arch. Proximal great vessels widely patent. Standard branching of the aortic arch. Right carotid system: Right carotid widely patent. No significant atherosclerotic disease. Left carotid system: Minimal atherosclerotic disease left carotid bifurcation without significant stenosis. Vertebral arteries: Both vertebral arteries patent to the basilar without significant stenosis. Skeleton: ACDF C4 through C7. Pseudarthrosis at C4-5. Solid fusion C5-6 and C6-7. Other neck: Negative for mass or adenopathy. Upper chest: Lung apices clear bilaterally. Review of the MIP images confirms the above findings CTA HEAD FINDINGS Anterior circulation: Cavernous carotid widely patent bilaterally with minimal atherosclerotic calcification. Anterior and middle cerebral arteries patent bilaterally without large vessel occlusion. Mild atherosclerotic disease in the A2 segment bilaterally. Mild stenosis superior division right middle cerebral artery. Posterior circulation: Both vertebral arteries patent to the basilar.  PICA not visualized. AICA patent bilaterally. Basilar widely patent. Superior cerebellar and posterior cerebral arteries patent bilaterally negative for significant stenosis or large vessel occlusion. Fetal origin of the left posterior cerebral artery. Venous sinuses: Normal venous enhancement Anatomic variants: None Review of the MIP images confirms the above  findings IMPRESSION: 1. Small area of acute infarction in the left posterior temporal lobe with hypodensity on CT. No acute hemorrhage. 2. Negative for carotid or vertebral artery stenosis in the neck 3. Negative for intracranial large vessel occlusion 4. Atherosclerotic irregularity in the anterior cerebral arteries and the superior division left middle cerebral artery which appears unrelated to the area of acute infarction Electronically Signed   By: Marlan Palau M.D.   On: 10/27/2019 22:27   MR Brain Wo Contrast  Result Date: 10/27/2019 CLINICAL DATA:  Episode of confusion and headache on 10/22/2019. TIA. Aphasia. Elevated blood pressure reading. EXAM: MRI HEAD WITHOUT CONTRAST TECHNIQUE: Multiplanar, multiecho pulse sequences of the brain and surrounding structures were obtained without intravenous contrast. COMPARISON:  Head CT 10/25/2019 FINDINGS: Brain: There are small early subacute infarcts in the posterior/inferior left MCA territory primarily affecting the temporal lobe. There is mild cytotoxic edema without mass effect or hemorrhage. No mass or extra-axial fluid collection is evident. Small T2 hyperintensities in the cerebral white matter bilaterally are nonspecific but compatible with mild chronic small vessel ischemic disease. Mild cerebral atrophy is within normal limits for age. Vascular: Major intracranial vascular flow voids are preserved. Skull and upper cervical spine: No suspicious marrow lesion. Partially visualized disc and facet degeneration in the cervical spine with suspected moderate spinal stenosis and mild cord flattening at C3-4. Sinuses/Orbits: Bilateral cataract extraction. Paranasal sinuses and mastoid air cells are clear. Other: None. IMPRESSION: 1. Small early subacute left MCA territory infarcts. 2. Mild chronic small vessel ischemic disease. These results will be called to the ordering clinician or representative by the Radiology Department at the imaging location.  Electronically Signed   By: Sebastian Ache M.D.   On: 10/27/2019 10:27   US Carotid Duplex Bilateral  Result Date: 10/27/2019 CLINICAL DATA:  79 year old female with a history of headache weakness and aphasia EXAM: BILATERAL CAROTID DUPLEX ULTRASOUND TECHNIQUE: Wallace Cullens scale imaging, color Doppler and duplex ultrasound were performed of bilateral carotid and vertebral arteries in the neck. COMPARISON:  None. FINDINGS: Criteria: Quantification of carotid stenosis is based on velocity parameters that correlate the residual internal carotid diameter with NASCET-based stenosis levels, using the diameter of the distal internal carotid lumen as the denominator for stenosis measurement. The following velocity measurements were obtained: RIGHT ICA:  Systolic 64 cm/sec, Diastolic 19 cm/sec CCA:  108 cm/sec SYSTOLIC ICA/CCA RATIO:  0.6 ECA:  102 cm/sec LEFT ICA:  Systolic 100 cm/sec, Diastolic 25 cm/sec CCA:  102 cm/sec SYSTOLIC ICA/CCA RATIO:  1.0 ECA:  56 cm/sec Right Brachial SBP: Not acquired Left Brachial SBP: Not acquired RIGHT CAROTID ARTERY: No significant calcified disease of the right common carotid artery. Intermediate waveform maintained. Heterogeneous plaque without significant calcifications at the right carotid bifurcation. Low resistance waveform of the right ICA. No significant tortuosity. RIGHT VERTEBRAL ARTERY: Antegrade flow with low resistance waveform. LEFT CAROTID ARTERY: No significant calcified disease of the left common carotid artery. Intermediate waveform maintained. Heterogeneous plaque at the left carotid bifurcation without significant calcifications. Low resistance waveform of the left ICA. LEFT VERTEBRAL ARTERY:  Antegrade flow with low resistance waveform. IMPRESSION: Color duplex indicates minimal heterogeneous plaque, with no hemodynamically significant stenosis by duplex criteria in  the extracranial cerebrovascular circulation. Signed, Yvone Neu. Reyne Dumas, RPVI [facility] Electronically Signed   By: Gilmer Mor D.O.   On: 10/27/2019 10:23   ECHOCARDIOGRAM COMPLETE  Result Date: 10/28/2019    ECHOCARDIOGRAM REPORT   Patient Name:   QUINTINA HAKEEM Date of Exam: 10/28/2019 Medical Rec #:  [id number]    Height:       60.0 in Accession #:    [id number]   Weight:       211.6 lb Date of Birth:  Aug 19, 1940    BSA:          1.912 m Patient Age:    79 years     BP:           160/87 mmHg Patient Gender: F            HR:           72 bpm. Exam Location:  Inpatient Procedure: 2D Echo, Cardiac Doppler and Color Doppler Indications:    Stroke 434.91 / I163.9  History:        Patient has prior history of Echocardiogram examinations, most                 recent 07/11/2013. Signs/Symptoms:Dyspnea; Risk Factors:GERD.  Sonographer:    Elmarie Shiley Dance Referring Phys: [id number] Kasandra Knudsen PAHWANI IMPRESSIONS  1. Left ventricular ejection fraction, by estimation, is 60 to 65%. The left ventricle has normal function. The left ventricle has no regional wall motion abnormalities. Left ventricular diastolic parameters are consistent with Grade I diastolic dysfunction (impaired relaxation). Elevated left ventricular end-diastolic pressure.  2. Right ventricular systolic function is normal. The right ventricular size is normal. There is normal pulmonary artery systolic pressure. The estimated right ventricular systolic pressure is 26.0 mmHg.  3. The mitral valve is normal in structure. Trivial mitral valve regurgitation. No evidence of mitral stenosis.  4. The aortic valve is normal in structure. Aortic valve regurgitation is mild. No aortic stenosis is present. Aortic regurgitation PHT measures 674 msec.  5. The inferior vena cava is normal in size with greater than 50% respiratory variability, suggesting right atrial pressure of 3 mmHg. FINDINGS  Left Ventricle: Left ventricular ejection fraction, by estimation, is 60 to 65%. The left ventricle has normal function. The left  ventricle has no regional wall motion abnormalities. The left ventricular internal cavity size was normal in size. There is  no left ventricular hypertrophy. Left ventricular diastolic parameters are consistent with Grade I diastolic dysfunction (impaired relaxation). Elevated left ventricular end-diastolic pressure. Right Ventricle: The right ventricular size is normal. No increase in right ventricular wall thickness. Right ventricular systolic function is normal. There is normal pulmonary artery systolic pressure. The tricuspid regurgitant velocity is 2.40 m/s, and  with an assumed right atrial pressure of 3 mmHg, the estimated right ventricular systolic pressure is 26.0 mmHg. Left Atrium: Left atrial size was normal in size. Right Atrium: Right atrial size was normal in size. Pericardium: There is no evidence of pericardial effusion. Mitral Valve: The mitral valve is normal in structure. Normal mobility of the mitral valve leaflets. Trivial mitral valve regurgitation. No evidence of mitral valve stenosis. Tricuspid Valve: The tricuspid valve is normal in structure. Tricuspid valve regurgitation is mild . No evidence of tricuspid stenosis. Aortic Valve: The aortic valve is normal in structure. Aortic valve regurgitation is mild. Aortic regurgitation PHT measures 674 msec. No aortic stenosis is present. Pulmonic Valve: The pulmonic valve was normal  in structure. Pulmonic valve regurgitation is not visualized. No evidence of pulmonic stenosis. Aorta: The aortic root is normal in size and structure. Venous: The inferior vena cava is normal in size with greater than 50% respiratory variability, suggesting right atrial pressure of 3 mmHg. IAS/Shunts: No atrial level shunt detected by color flow Doppler.  LEFT VENTRICLE PLAX 2D LVIDd:         4.20 cm  Diastology LVIDs:         2.50 cm  LV e' lateral:   7.33 cm/s LV PW:         1.10 cm  LV E/e' lateral: 14.1 LV IVS:        0.80 cm  LV e' medial:    7.80 cm/s LVOT diam:      1.70 cm  LV E/e' medial:  13.2 LV SV:         64 LV SV Index:   34 LVOT Area:     2.27 cm  RIGHT VENTRICLE             IVC RV Basal diam:  2.20 cm     IVC diam: 1.80 cm RV S prime:     14.00 cm/s TAPSE (M-mode): 2.2 cm LEFT ATRIUM             Index       RIGHT ATRIUM           Index LA diam:        3.20 cm 1.67 cm/m  RA Area:     11.70 cm LA Vol (A2C):   53.6 ml 28.03 ml/m RA Volume:   24.90 ml  13.02 ml/m LA Vol (A4C):   32.9 ml 17.21 ml/m LA Biplane Vol: 43.4 ml 22.70 ml/m  AORTIC VALVE LVOT Vmax:   121.00 cm/s LVOT Vmean:  83.350 cm/s LVOT VTI:    0.282 m AI PHT:      674 msec  AORTA Ao Root diam: 3.30 cm Ao Asc diam:  2.80 cm MITRAL VALVE                TRICUSPID VALVE MV Area (PHT): 2.83 cm     TR Peak grad:   23.0 mmHg MV Decel Time: 268 msec     TR Vmax:        240.00 cm/s MV E velocity: 103.00 cm/s MV A velocity: 125.00 cm/s  SHUNTS MV E/A ratio:  0.82         Systemic VTI:  0.28 m                             Systemic Diam: 1.70 cm Armanda Magicraci Turner MD Electronically signed by Armanda Magicraci Turner MD Signature Date/Time: 10/28/2019/1:31:26 PM    Final    CT ANGIO HEAD CODE STROKE  Result Date: 10/27/2019 CLINICAL DATA:  Stroke EXAM: CT ANGIOGRAPHY HEAD AND NECK TECHNIQUE: Multidetector CT imaging of the head and neck was performed using the standard protocol during bolus administration of intravenous contrast. Multiplanar CT image reconstructions and MIPs were obtained to evaluate the vascular anatomy. Carotid stenosis measurements (when applicable) are obtained utilizing NASCET criteria, using the distal internal carotid diameter as the denominator. CONTRAST:  100mL OMNIPAQUE IOHEXOL 350 MG/ML SOLN COMPARISON:  MRI head 10/27/2019 FINDINGS: CT HEAD FINDINGS Brain: Ill-defined small hypodensity in the left posterior temporal lobe corresponding to acute or subacute infarct on diffusion-weighted imaging. No other areas of infarct. Negative for hemorrhage or mass.  Negative for hydrocephalus. Vascular: Negative  for hyperdense vessel Skull: Negative Sinuses: Paranasal sinuses clear. Orbits: Bilateral cataract extraction.  No orbital lesion. Review of the MIP images confirms the above findings CTA NECK FINDINGS Aortic arch: Mild atherosclerotic calcification aortic arch. Proximal great vessels widely patent. Standard branching of the aortic arch. Right carotid system: Right carotid widely patent. No significant atherosclerotic disease. Left carotid system: Minimal atherosclerotic disease left carotid bifurcation without significant stenosis. Vertebral arteries: Both vertebral arteries patent to the basilar without significant stenosis. Skeleton: ACDF C4 through C7. Pseudarthrosis at C4-5. Solid fusion C5-6 and C6-7. Other neck: Negative for mass or adenopathy. Upper chest: Lung apices clear bilaterally. Review of the MIP images confirms the above findings CTA HEAD FINDINGS Anterior circulation: Cavernous carotid widely patent bilaterally with minimal atherosclerotic calcification. Anterior and middle cerebral arteries patent bilaterally without large vessel occlusion. Mild atherosclerotic disease in the A2 segment bilaterally. Mild stenosis superior division right middle cerebral artery. Posterior circulation: Both vertebral arteries patent to the basilar. PICA not visualized. AICA patent bilaterally. Basilar widely patent. Superior cerebellar and posterior cerebral arteries patent bilaterally negative for significant stenosis or large vessel occlusion. Fetal origin of the left posterior cerebral artery. Venous sinuses: Normal venous enhancement Anatomic variants: None Review of the MIP images confirms the above findings IMPRESSION: 1. Small area of acute infarction in the left posterior temporal lobe with hypodensity on CT. No acute hemorrhage. 2. Negative for carotid or vertebral artery stenosis in the neck 3. Negative for intracranial large vessel occlusion 4. Atherosclerotic irregularity in the anterior cerebral arteries  and the superior division left middle cerebral artery which appears unrelated to the area of acute infarction Electronically Signed   By: Marlan Palau M.D.   On: 10/27/2019 22:27    Microbiology: Recent Results (from the past 240 hour(s))  SARS Coronavirus 2 by RT PCR (hospital order, performed in [facility] hospital lab) Nasopharyngeal Nasopharyngeal Swab     Status: None   Collection Time: 10/27/19  4:12 PM   Specimen: Nasopharyngeal Swab  Result Value Ref Range Status   SARS Coronavirus 2 NEGATIVE NEGATIVE Final    Comment: (NOTE) SARS-CoV-2 target nucleic acids are NOT DETECTED.  The SARS-CoV-2 RNA is generally detectable in upper and lower respiratory specimens during the acute phase of infection. The lowest concentration of SARS-CoV-2 viral copies this assay can detect is 250 copies / mL. A negative result does not preclude SARS-CoV-2 infection and should not be used as the sole basis for treatment or other patient management decisions.  A negative result may occur with improper specimen collection / handling, submission of specimen other than nasopharyngeal swab, presence of viral mutation(s) within the areas targeted by this assay, and inadequate number of viral copies (<250 copies / mL). A negative result must be combined with clinical observations, patient history, and epidemiological information.  Fact Sheet for Patients:   BoilerBrush.com.cy  Fact Sheet for Healthcare Providers: https://pope.com/  This test is not yet approved or  cleared by the [place] FDA and has been authorized for detection and/or diagnosis of SARS-CoV-2 by FDA under an Emergency Use Authorization (EUA).  This EUA will remain in effect (meaning this test can be used) for the duration of the COVID-19 declaration under Section 564(b)(1) of the Act, 21 U.S.C. section 360bbb-3(b)(1), unless the authorization is terminated or revoked  sooner.  Performed at [facility], [address] [place], [place] [address]      Labs: Basic Metabolic Panel: Recent Labs  Lab 10/25/19 1100 10/27/19 1436 10/27/19 1454 10/27/19 1612  NA 138 137 139  --   K 3.9 3.8 3.7  --   CL 102 102 102  --   CO2 24 25  --   --   GLUCOSE 97 96 92  --   BUN 21 14 15   --   CREATININE 0.61 0.67 0.60 0.68  CALCIUM 9.5 9.3  --   --    Liver Function Tests: Recent Labs  Lab 10/27/19 1436  AST 16  ALT 15  ALKPHOS 72  BILITOT 0.2*  PROT 7.3  ALBUMIN 3.7   No results for input(s): LIPASE, AMYLASE in the last 168 hours. No results for input(s): AMMONIA in the last 168 hours. CBC: Recent Labs  Lab 10/25/19 1100 10/27/19 1436 10/27/19 1454 10/27/19 1612  WBC 9.6 10.1  --  10.4  NEUTROABS 6.9 7.1  --   --   HGB 13.4 13.9 14.6 13.2  HCT 42.8 43.3 43.0 41.4  MCV 92.0 90.6  --  90.4  PLT 337 318  --  310   Cardiac Enzymes: No results for input(s): CKTOTAL, CKMB, CKMBINDEX, TROPONINI in the last 168 hours. BNP: BNP (last 3 results) No results for input(s): BNP in the last 8760 hours.  ProBNP (last 3 results) No results for input(s): PROBNP in the last 8760 hours.  CBG: Recent Labs  Lab 10/27/19 1550  GLUCAP 100*       Signed:  12/27/19 MD.  [facility] 10/28/2019, 3:44 PM

## 2019-10-28 NOTE — Discharge Instructions (Signed)
 Hospital Discharge After a Stroke  Being discharged from the hospital after a stroke can feel overwhelming. Many things may be different, and it is normal to feel scared or anxious. Some stroke survivors may be able to return to their homes, and others may need more specialized care on a temporary or permanent basis. Your stroke care team will work with you to develop a discharge plan that is best for you. Ask questions if you do not understand something. Invite a friend or family member to participate in discharge planning. Understanding and following your discharge plan can help to prevent another stroke or other problems. Understanding your medicines After a stroke, your health care provider may prescribe one or more types of medicine. It is important to take medicines exactly as told by your health care provider. Serious harm, such as another stroke, can happen if you are unable to take your medicine exactly as prescribed. Make sure you understand:  What medicine to take.  Why you are taking the medicine.  How and when to take it.  If it can be taken with your other medicines and herbal supplements.  Possible side effects.  When to call your health care provider if you have any side effects.  How you will get and pay for your medicines. Medical assistance programs may be able to help you pay for prescription medicines if you cannot afford them. If you are taking an anticoagulant, be sure to take it exactly as told by your health care provider. This type of medicine can increase the risk of bleeding because it works to prevent blood from clotting. You may need to take certain precautions to prevent bleeding. You should contact your health care provider if you have:  Bleeding or bruising.  A fall or other injury to your head.  Blood in your urine or stool (feces). Planning for home safety  Take steps to prevent falls, such as installing grab bars or using a shower chair. Ask a  friend or family member to get needed things in place before you go home if possible. A therapist can come to your home to make recommendations for safety equipment. Ask your health care provider if you would benefit from this service or from home care. Getting needed equipment Ask your health care provider for a list of any medical equipment and supplies you will need at home. These may include items such as:  Walkers.  Canes.  Wheelchairs.  Hand-strengthening devices.  Special eating utensils. Medical equipment can be rented or purchased, depending on your insurance coverage. Check with your insurance company about what is covered. Keeping follow-up visits After a stroke, you will need to follow up regularly with a health care provider. You may also need rehabilitation, which can include physical therapy, occupational therapy, or speech-language therapy. Keeping these appointments is very important to your recovery after a stroke. Be sure to bring your medicine list and discharge papers with you to your appointments. If you need help to keep track of your schedule, use a calendar or appointment reminder. Preventing another stroke Having a stroke puts you at risk for another stroke in the future. Ask your health care provider what actions you can take to lower the risk. These may include:  Increasing how much you exercise.  Making a healthy eating plan.  Quitting smoking.  Managing other health conditions, such as high blood pressure, high cholesterol, or diabetes.  Limiting alcohol use. Knowing the warning signs of a stroke  Make   sure you understand the signs of a stroke. Before you leave the hospital, you will receive information outlining the stroke warning signs. Share these with your friends and family members. "BE FAST" is an easy way to remember the main warning signs of a stroke:  B - Balance. Signs are dizziness, sudden trouble walking, or loss of balance.  E - Eyes.  Signs are trouble seeing or a sudden change in vision.  F - Face. Signs are sudden weakness or numbness of the face, or the face or eyelid drooping on one side.  A - Arms. Signs are weakness or numbness in an arm. This happens suddenly and usually on one side of the body.  S - Speech. Signs are sudden trouble speaking, slurred speech, or trouble understanding what people say.  T - Time. Time to call emergency services. Write down what time symptoms started. Other signs of stroke may include:  A sudden, severe headache with no known cause.  Nausea or vomiting.  Seizure. These symptoms may represent a serious problem that is an emergency. Do not wait to see if the symptoms will go away. Get medical help right away. Call your local emergency services (911 in the [place] Do not drive yourself to the hospital. Make note of the time that you had your first symptoms. Your emergency responders or emergency room staff will need to know this information. Summary  Being discharged from the hospital after a stroke can feel overwhelming. It is normal to feel scared or anxious.  Make sure you take medicines exactly as told by your health care provider.  Know the warning signs of a stroke, and get help right way if you have any of these symptoms. "BE FAST" is an easy way to remember the main warning signs of a stroke. This information is not intended to replace advice given to you by your health care provider. Make sure you discuss any questions you have with your health care provider. Document Revised: 11/30/2018 Document Reviewed: 06/12/2016 Elsevier Patient Education  2020 [employer]  

## 2019-10-28 NOTE — Care Management Obs Status (Signed)
MEDICARE OBSERVATION STATUS NOTIFICATION   Patient Details  Name: Kerry Richardson MRN: [id number] Date of Birth: 1940/12/22   Medicare Observation Status Notification Given:  Yes    Isaias Cowman, RN 10/28/2019, 8:57 AM

## 2019-10-28 NOTE — Progress Notes (Signed)
STROKE TEAM PROGRESS NOTE   INTERVAL HISTORY Her OT is at the bedside.  She is doing well, her speech is basically at her baseline now.  However, she recounted HPI with me that she had headache last Sunday, then had speech difficulty, difficulty getting words out Monday, gradually getting better Tuesday and Wednesday when she saw her PCP and ordered MRI which later showed left MCA subacute scattered infarcts.  She denies any heart palpitation or cardiac issues in the past.  However, her BP was elevated, started Norvasc 2.5 mg by her PCP last week.  However, BP still elevated during this admission.  OBJECTIVE Vitals:   10/28/19 0510 10/28/19 0600 10/28/19 0755 10/28/19 1143  BP: (!) 160/87  (!) 158/54 (!) 163/61  Pulse: 76 76 76 74  Resp: 20  16 18   Temp: 98 F (36.7 C)  98 F (36.7 C) 98.4 F (36.9 C)  TempSrc: Oral  Oral Oral  SpO2: 100%  98% 95%  Weight:      Height:        CBC:  Recent Labs  Lab 10/25/19 1100 10/25/19 1100 10/27/19 1436 10/27/19 1436 10/27/19 1454 10/27/19 1612  WBC 9.6   < > 10.1  --   --  10.4  NEUTROABS 6.9  --  7.1  --   --   --   HGB 13.4   < > 13.9   < > 14.6 13.2  HCT 42.8   < > 43.3   < > 43.0 41.4  MCV 92.0   < > 90.6  --   --  90.4  PLT 337   < > 318  --   --  310   < > = values in this interval not displayed.    Basic Metabolic Panel:  Recent Labs  Lab 10/25/19 1100 10/25/19 1100 10/27/19 1436 10/27/19 1436 10/27/19 1454 10/27/19 1612  NA 138   < > 137  --  139  --   K 3.9   < > 3.8  --  3.7  --   CL 102   < > 102  --  102  --   CO2 24  --  25  --   --   --   GLUCOSE 97   < > 96  --  92  --   BUN 21   < > 14  --  15  --   CREATININE 0.61   < > 0.67   < > 0.60 0.68  CALCIUM 9.5  --  9.3  --   --   --    < > = values in this interval not displayed.    Lipid Panel:     Component Value Date/Time   CHOL 225 (H) 10/28/2019 0356   CHOL 250 (H) 05/04/2019 1009   TRIG 139 10/28/2019 0356   HDL 62 10/28/2019 0356   HDL 71  05/04/2019 1009   CHOLHDL 3.6 10/28/2019 0356   VLDL 28 10/28/2019 0356   LDLCALC 135 (H) 10/28/2019 0356   LDLCALC 156 (H) 05/04/2019 1009   HgbA1c:  Lab Results  Component Value Date   HGBA1C 6.2 (H) 10/28/2019   Urine Drug Screen: No results found for: LABOPIA, COCAINSCRNUR, LABBENZ, AMPHETMU, THCU, LABBARB  Alcohol Level No results found for: ETH  IMAGING  CT ANGIO HEAD CODE STROKE CT ANGIO NECK W OR WO CONTRAST 10/27/2019 IMPRESSION:  1. Small area of acute infarction in the left posterior temporal lobe with hypodensity on CT. No  acute hemorrhage.  2. Negative for carotid or vertebral artery stenosis in the neck  3. Negative for intracranial large vessel occlusion  4. Atherosclerotic irregularity in the anterior cerebral arteries and the superior division left middle cerebral artery which appears unrelated to the area of acute infarction   MR Brain Wo Contrast 10/27/2019 IMPRESSION:  1. Small early subacute left MCA territory infarcts.  2. Mild chronic small vessel ischemic disease.   US Carotid Duplex Bilateral 10/27/2019 IMPRESSION:  Color duplex indicates minimal heterogeneous plaque, with no hemodynamically significant stenosis by duplex criteria in the extracranial cerebrovascular circulation.   ECHOCARDIOGRAM COMPLETE 10/28/2019 IMPRESSIONS   1. Left ventricular ejection fraction, by estimation, is 60 to 65%. The left ventricle has normal function. The left ventricle has no regional wall motion abnormalities. Left ventricular diastolic parameters are consistent with Grade I diastolic dysfunction (impaired relaxation). Elevated left ventricular end-diastolic pressure.   2. Right ventricular systolic function is normal. The right ventricular size is normal. There is normal pulmonary artery systolic pressure. The estimated right ventricular systolic pressure is 26.0 mmHg.   3. The mitral valve is normal in structure. Trivial mitral valve regurgitation. No evidence of mitral  stenosis.   4. The aortic valve is normal in structure. Aortic valve regurgitation is mild. No aortic stenosis is present. Aortic regurgitation PHT measures 674 msec.   5. The inferior vena cava is normal in size with greater than 50% respiratory variability, suggesting right atrial pressure of 3 mmHg.   ECG - SR rate 78 BPM. (See cardiology reading for complete details)   PHYSICAL EXAM  Temp:  [97.5 F (36.4 C)-100 F (37.8 C)] 98.4 F (36.9 C) (08/07 1143) Pulse Rate:  [70-79] 74 (08/07 1143) Resp:  [16-24] 18 (08/07 1143) BP: (158-194)/(54-95) 163/61 (08/07 1143) SpO2:  [95 %-100 %] 95 % (08/07 1143) Weight:  [96 kg] 96 kg (08/06 2131)  General - Well nourished, well developed, in no apparent distress.  Ophthalmologic - fundi not visualized due to noncooperation.  Cardiovascular - Regular rhythm and rate.  Mental Status -  Level of arousal and orientation to time, place, and person were intact. Language including expression, naming, repetition, comprehension was assessed and found intact.  Although patient still felt not 100% back to normal. Fund of Knowledge was assessed and was intact.  Cranial Nerves II - XII - II - Visual field intact OU. III, IV, VI - Extraocular movements intact. V - Facial sensation intact bilaterally. VII - Facial movement intact bilaterally. VIII - Hearing & vestibular intact bilaterally. X - Palate elevates symmetrically. XI - Chin turning & shoulder shrug intact bilaterally. XII - Tongue protrusion intact.  Motor Strength - The patient's strength was normal in all extremities and pronator drift was absent.  Bulk was normal and fasciculations were absent.   Motor Tone - Muscle tone was assessed at the neck and appendages and was normal.  Reflexes - The patient's reflexes were symmetrical in all extremities and she had no pathological reflexes.  Sensory - Light touch, temperature/pinprick were assessed and were symmetrical.    Coordination -  The patient had normal movements in the hands and feet with no ataxia or dysmetria.  Tremor was absent.  Gait and Station - deferred.   ASSESSMENT/PLAN [name] is a 79 y.o. female with history of hypertension, migraine headaches and obesity who presented to the emergency department with confusion and difficulty finding words following a severe headache. She did not receive IV t-PA due to late  presentation (>4.5 hours from time of onset).  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 Mild chronic small vessel ischemic disease.   CTA H&N - Negative for carotid or vertebral artery stenosis in the neck. Negative for intracranial large vessel occlusion.   Carotid Doppler - unremarkable  2D Echo - EF 60 - 65%. No cardiac source of emboli identified.   Recommend outpatient loop recorder placement to rule out A. fib.  Discussed with Dr. Johney Frame.  Sars Corona Virus 2 - negative  LDL - 135  HgbA1c - 6.2  VTE prophylaxis - Lovenox  No antithrombotic prior to admission, now on aspirin 81 mg daily and clopidogrel 75 mg daily.  Continue DAPT for 3 weeks and then aspirin alone.  Patient counseled to be compliant with her antithrombotic medications  Ongoing aggressive stroke risk factor management  Therapy recommendations:  pending  Disposition:  Pending  Hypertension  Home BP meds: amlodipine 2.5  Current BP meds: amlodipine 5  Stable on the high end  Educated on home BP monitoring . Long-term BP goal normotensive  Hyperlipidemia  Home Lipid lowering medication: none   LDL 135, goal < 70  Current lipid lowering medication: Lipitor 40 mg daily   Continue statin at discharge  Other Stroke Risk Factors  Advanced age  Obesity, Body mass index is 41.33 kg/m., recommend weight loss, diet and exercise as appropriate   Migraines  Other Active Problems  Code  status - Full code   Hospital day # 1  Neurology will sign off. Please call with questions. Pt will follow up with stroke clinic NP at [facility] in about 4 weeks. Thanks for the consult.  Marvel Plan, MD PhD Stroke Neurology 10/28/2019 6:29 PM   To contact Stroke Continuity provider, please refer to [email or web address]. After hours, contact General Neurology

## 2019-10-28 NOTE — Progress Notes (Signed)
Nsg Discharge Note  Admit Date:  10/27/2019 Discharge date: 10/28/2019   Vertell Limber to be D/C'd  per MD order.  AVS completed.  . Patient/caregiver able to verbalize understanding.  Discharge Medication: Allergies as of 10/28/2019      Reactions   Codeine Nausea Only      Medication List    STOP taking these medications   ibuprofen 200 MG tablet Commonly known as: ADVIL     TAKE these medications   amLODipine 5 MG tablet Commonly known as: NORVASC Take 1 tablet (5 mg total) by mouth daily. Start taking on: October 29, 2019 What changed:   medication strength  how much to take   aspirin 81 MG EC tablet Take 1 tablet (81 mg total) by mouth daily. Swallow whole. Start taking on: October 29, 2019   atorvastatin 40 MG tablet Commonly known as: LIPITOR Take 1 tablet (40 mg total) by mouth daily. Start taking on: October 29, 2019   clopidogrel 75 MG tablet Commonly known as: PLAVIX Take 1 tablet (75 mg total) by mouth daily for 21 days. Start taking on: October 29, 2019   ketoconazole 2 % cream Commonly known as: NIZORAL APPLY TO AFFECTED AREA TWICE DAILY. What changed: See the new instructions.   NIACINAMIDE PO Take 1 tablet by mouth daily.   OVER THE COUNTER MEDICATION Take 2 capsules by mouth daily. [place] Mushroom   triamcinolone cream 0.1 % Commonly known as: KENALOG Apply 1 application topically 2 (two) times daily as needed (itching).   Vitamin D 125 MCG (5000 UT) Caps Take 5,000 Units by mouth daily.   Zinc 50 MG Tabs Take 50 mg by mouth daily.       Discharge Assessment: Vitals:   10/28/19 0755 10/28/19 1143  BP: (!) 158/54 (!) 163/61  Pulse: 76 74  Resp: 16 18  Temp: 98 F (36.7 C) 98.4 F (36.9 C)  SpO2: 98% 95%   Skin clean, dry and intact without evidence of skin break down, no evidence of skin tears noted. IV catheter discontinued intact. Site without signs and symptoms of complications - no redness or edema noted at insertion site,  patient denies c/o pain - only slight tenderness at site.  Dressing with slight pressure applied.  D/c Instructions-Education: Discharge instructions given to patient/family with verbalized understanding. D/c education completed with patient/family including follow up instructions, medication list, d/c activities limitations if indicated, with other d/c instructions as indicated by MD - patient able to verbalize understanding, all questions fully answered. Patient instructed to return to ED, call 911, or call MD for any changes in condition.  Patient escorted via WC, and D/C home via private auto.  Kaitlyn Franko, Tilford Pillar, RN 10/28/2019 4:48 PM

## 2019-10-28 NOTE — Progress Notes (Signed)
Received referral to assist with outpt PT. Met with pt. Pt lives alone. She plans to return home. She reports that her sons live 10 minutes away and they can assist her if prn. She drives. She denies any issues buying her medications. She doesn't use any DME. Discussed outpt rehab referral. She prefers to go to the rehab center in [place]. Outpt rehab referral sent to [facility].

## 2019-10-28 NOTE — Care Management CC44 (Signed)
Condition Code 44 Documentation Completed  Patient Details  Name: MAYBEL DAMBROSIO MRN: [id number] Date of Birth: 1940-05-01   Condition Code 44 given:  Yes Patient signature on Condition Code 44 notice:  Yes Documentation of 2 MD's agreement:  Yes Code 44 added to claim:  Yes    Isaias Cowman, RN 10/28/2019, 8:58 AM

## 2019-10-28 NOTE — Progress Notes (Signed)
PT Cancellation Note  Patient Details Name: EMELDA KOHLBECK MRN: [id number] DOB: 1940-09-22   Cancelled Treatment:    Reason Eval/Treat Not Completed: Patient at procedure or test/unavailable. Pt currently undergoing ECHO. Will check back as schedule allows to initiate PT evaluation.    Marylynn Pearson 10/28/2019, 8:11 AM   Conni Slipper, PT, DPT Acute Rehabilitation Services Pager: [phone] Office: [phone]

## 2019-10-28 NOTE — Consult Note (Signed)
ELECTROPHYSIOLOGY CONSULT NOTE  Patient ID: Kerry Richardson MRN: [id number], DOB/AGE: [birth year]   Admit date: 10/27/2019 Date of Consult: 10/28/2019  Primary Physician: Annalee Genta, DO   Reason for Consultation: Cryptogenic stroke; recommendations regarding risks of arrhythmia as well as arrhythmia detection options  History of Present Illness Kerry Richardson was admitted on 10/27/2019 with acute CVA. she has been monitored on telemetry which has demonstrated no sustained arrhythmias. She did have very brief disorganized atrial activity on telemetry.  No cause has been identified. Inpatient stroke work-up has been completed, without cause found. EP has been asked to evaluate for placement of an implantable loop recorder to monitor for atrial fibrillation.  Past Medical History:  Diagnosis Date  . Allergy    chronic  . Alpha galactosidase deficiency   . Arthritis   . Back pain   . Bulging discs   . Bursitis   . DDD (degenerative disc disease)   . Dyspnea    with exertion  . GERD (gastroesophageal reflux disease)    mild  . Glaucoma   . H/O removal of cyst    finger (uncertain of which finger)  . Migraine headache   . Neck pain   . PONV (postoperative nausea and vomiting)   . Reflux   . Seasonal allergies      Past Surgical History:  Procedure Laterality Date  . ABDOMINAL HYSTERECTOMY    . CARPAL TUNNEL RELEASE     bilateral  . CATARACT EXTRACTION W/PHACO Left 10/01/2014   Procedure: CATARACT EXTRACTION PHACO AND INTRAOCULAR LENS PLACEMENT LEFT EYE;  Surgeon: Gemma Payor, MD;  Location: [facility];  Service: Ophthalmology;  Laterality: Left;  CDE:8.15  . CATARACT EXTRACTION W/PHACO Right 08/02/2017   Procedure: CATARACT EXTRACTION WITH PHACOEMULSIFICATION  AND INTRAOCULAR LENS PLACEMENT AND PLACEMENT OF iSTENT GLAUCOMA DEVICE RIGHT EYE;  Surgeon: Gemma Payor, MD;  Location: [facility];  Service: Ophthalmology;  Laterality: Right;  CDE: 7.77  . CERVICAL FUSION    . CHOLECYSTECTOMY N/A  05/15/2013   Procedure: LAPAROSCOPIC CHOLECYSTECTOMY WITH INTRAOPERATIVE CHOLANGIOGRAM;  Surgeon: Dalia Heading, MD;  Location: [facility];  Service: General;  Laterality: N/A;  . CHONDROPLASTY  03/14/2012   Procedure: CHONDROPLASTY;  Surgeon: Vickki Hearing, MD;  Location: [facility];  Service: Orthopedics;  Laterality: Left;  medial condyle  . CHONDROPLASTY Right 12/23/2012   Procedure: KNEE ARTHROSCOPY WITH CHONDROPLASTY WITH LIMITED DEBRIDMENT;  Surgeon: Vickki Hearing, MD;  Location: [facility];  Service: Orthopedics;  Laterality: Right;  . FOOT SURGERY Bilateral    morton's neuroma  . KNEE ARTHROSCOPY WITH MEDIAL MENISECTOMY  03/14/2012   Procedure: KNEE ARTHROSCOPY WITH MEDIAL MENISECTOMY;  Surgeon: Vickki Hearing, MD;  Location: [facility];  Service: Orthopedics;  Laterality: Left;     Allergies  Allergen Reactions  . Codeine Nausea Only    Inpatient Medications .  stroke: mapping our early stages of recovery book   Does not apply Once  . amLODipine  5 mg Oral Daily  . aspirin EC  81 mg Oral Daily  . atorvastatin  40 mg Oral Daily  . clopidogrel  75 mg Oral Daily  . enoxaparin (LOVENOX) injection  40 mg Subcutaneous Q24H  . labetalol  10 mg Intravenous Once     Social History   Socioeconomic History  . Marital status: Widowed    Spouse name: Not on file  . Number of children: Not on file  . Years of education: 12+  . Highest education level: Not  on file  Occupational History  . Occupation: realtor  Tobacco Use  . Smoking status: Never Smoker  . Smokeless tobacco: Never Used  Vaping Use  . Vaping Use: Never used  Substance and Sexual Activity  . Alcohol use: No  . Drug use: No  . Sexual activity: Never  Other Topics Concern  . Not on file  Social History Narrative  . Not on file   Social Determinants of Health   Financial Resource Strain:   . Difficulty of Paying Living Expenses:   Food Insecurity:   . Worried About Programme researcher, broadcasting/film/video in the Last Year:   .  Barista in the Last Year:   Transportation Needs:   . Freight forwarder (Medical):   Marland Kitchen Lack of Transportation (Non-Medical):   Physical Activity:   . Days of Exercise per Week:   . Minutes of Exercise per Session:   Stress:   . Feeling of Stress :   Social Connections:   . Frequency of Communication with Friends and Family:   . Frequency of Social Gatherings with Friends and Family:   . Attends Religious Services:   . Active Member of Clubs or Organizations:   . Attends Banker Meetings:   Marland Kitchen Marital Status:   Intimate Partner Violence:   . Fear of Current or Ex-Partner:   . Emotionally Abused:   Marland Kitchen Physically Abused:   . Sexually Abused:      Review of Systems All systems reviewed and are otherwise negative except as noted above.  Family History  Problem Relation Age of Onset  . Hypertension Mother        borderline    Physical Exam Blood pressure (!) 163/61, pulse 74, temperature 98.4 F (36.9 C), temperature source Oral, resp. rate 18, height 5' (1.524 m), weight 96 kg, SpO2 95 %.  General: Well developed, well appearing 79 y.o. female in no acute distress. HEENT: Normocephalic, atraumatic. EOMs intact. Sclera nonicteric. Oropharynx clear.  Neck: Supple without bruits. No JVD. Lungs: regular WOB Heart: RRR  Abdomen: Soft  Quadrants  Extremities: No clubbing, cyanosis or edema.  Psych: Normal affect. Musculoskeletal: No kyphosis. Skin: Intact. Warm and dry. No rashes or petechiae in exposed areas.   Labs Lab Results  Component Value Date   WBC 10.4 10/27/2019   HGB 13.2 10/27/2019   HCT 41.4 10/27/2019   MCV 90.4 10/27/2019   PLT 310 10/27/2019    Recent Labs  Lab 10/27/19 1436 10/27/19 1436 10/27/19 1454 10/27/19 1454 10/27/19 1612  NA 137   < > 139  --   --   K 3.8   < > 3.7  --   --   CL 102   < > 102  --   --   CO2 25  --   --   --   --   BUN 14   < > 15  --   --   CREATININE 0.67   < > 0.60   < > 0.68  CALCIUM 9.3  --    --   --   --   PROT 7.3  --   --   --   --   BILITOT 0.2*  --   --   --   --   ALKPHOS 72  --   --   --   --   ALT 15  --   --   --   --   AST 16  --   --   --   --  GLUCOSE 96   < > 92  --   --    < > = values in this interval not displayed.   Recent Labs    10/27/19 1436  INR 0.9    Radiology/Studies CT Head Wo Contrast  Result Date: 10/25/2019 CLINICAL DATA:  Sudden onset acute headache for 4 days EXAM: CT HEAD WITHOUT CONTRAST TECHNIQUE: Contiguous axial images were obtained from the base of the skull through the vertex without intravenous contrast. COMPARISON:  None. FINDINGS: Brain: Minor age related brain atrophy and minimal white matter microvascular ischemic changes about the ventricles. No acute intracranial hemorrhage, mass lesion, new infarction, midline shift, herniation, hydrocephalus, or extra-axial fluid collection. No focal mass effect or edema. Cisterns are patent. No cerebellar abnormality. Vascular: No hyperdense vessel or unexpected calcification. Skull: Normal. Negative for fracture or focal lesion. Sinuses/Orbits: No acute finding. Other: None. IMPRESSION: Minor atrophy and white matter microvascular ischemic changes. No acute intracranial abnormality by noncontrast CT. Electronically Signed   By: Judie PetitM.  Shick M.D.   On: 10/25/2019 10:49   CT ANGIO NECK W OR WO CONTRAST  Result Date: 10/27/2019 CLINICAL DATA:  Stroke EXAM: CT ANGIOGRAPHY HEAD AND NECK TECHNIQUE: Multidetector CT imaging of the head and neck was performed using the standard protocol during bolus administration of intravenous contrast. Multiplanar CT image reconstructions and MIPs were obtained to evaluate the vascular anatomy. Carotid stenosis measurements (when applicable) are obtained utilizing NASCET criteria, using the distal internal carotid diameter as the denominator. CONTRAST:  100mL OMNIPAQUE IOHEXOL 350 MG/ML SOLN COMPARISON:  MRI head 10/27/2019 FINDINGS: CT HEAD FINDINGS Brain: Ill-defined small  hypodensity in the left posterior temporal lobe corresponding to acute or subacute infarct on diffusion-weighted imaging. No other areas of infarct. Negative for hemorrhage or mass. Negative for hydrocephalus. Vascular: Negative for hyperdense vessel Skull: Negative Sinuses: Paranasal sinuses clear. Orbits: Bilateral cataract extraction.  No orbital lesion. Review of the MIP images confirms the above findings CTA NECK FINDINGS Aortic arch: Mild atherosclerotic calcification aortic arch. Proximal great vessels widely patent. Standard branching of the aortic arch. Right carotid system: Right carotid widely patent. No significant atherosclerotic disease. Left carotid system: Minimal atherosclerotic disease left carotid bifurcation without significant stenosis. Vertebral arteries: Both vertebral arteries patent to the basilar without significant stenosis. Skeleton: ACDF C4 through C7. Pseudarthrosis at C4-5. Solid fusion C5-6 and C6-7. Other neck: Negative for mass or adenopathy. Upper chest: Lung apices clear bilaterally. Review of the MIP images confirms the above findings CTA HEAD FINDINGS Anterior circulation: Cavernous carotid widely patent bilaterally with minimal atherosclerotic calcification. Anterior and middle cerebral arteries patent bilaterally without large vessel occlusion. Mild atherosclerotic disease in the A2 segment bilaterally. Mild stenosis superior division right middle cerebral artery. Posterior circulation: Both vertebral arteries patent to the basilar. PICA not visualized. AICA patent bilaterally. Basilar widely patent. Superior cerebellar and posterior cerebral arteries patent bilaterally negative for significant stenosis or large vessel occlusion. Fetal origin of the left posterior cerebral artery. Venous sinuses: Normal venous enhancement Anatomic variants: None Review of the MIP images confirms the above findings IMPRESSION: 1. Small area of acute infarction in the left posterior temporal  lobe with hypodensity on CT. No acute hemorrhage. 2. Negative for carotid or vertebral artery stenosis in the neck 3. Negative for intracranial large vessel occlusion 4. Atherosclerotic irregularity in the anterior cerebral arteries and the superior division left middle cerebral artery which appears unrelated to the area of acute infarction Electronically Signed   By: Delorise Jacksonharles  Clark M.D.  On: 10/27/2019 22:27   MR Brain Wo Contrast  Result Date: 10/27/2019 CLINICAL DATA:  Episode of confusion and headache on 10/22/2019. TIA. Aphasia. Elevated blood pressure reading. EXAM: MRI HEAD WITHOUT CONTRAST TECHNIQUE: Multiplanar, multiecho pulse sequences of the brain and surrounding structures were obtained without intravenous contrast. COMPARISON:  Head CT 10/25/2019 FINDINGS: Brain: There are small early subacute infarcts in the posterior/inferior left MCA territory primarily affecting the temporal lobe. There is mild cytotoxic edema without mass effect or hemorrhage. No mass or extra-axial fluid collection is evident. Small T2 hyperintensities in the cerebral white matter bilaterally are nonspecific but compatible with mild chronic small vessel ischemic disease. Mild cerebral atrophy is within normal limits for age. Vascular: Major intracranial vascular flow voids are preserved. Skull and upper cervical spine: No suspicious marrow lesion. Partially visualized disc and facet degeneration in the cervical spine with suspected moderate spinal stenosis and mild cord flattening at C3-4. Sinuses/Orbits: Bilateral cataract extraction. Paranasal sinuses and mastoid air cells are clear. Other: None. IMPRESSION: 1. Small early subacute left MCA territory infarcts. 2. Mild chronic small vessel ischemic disease. These results will be called to the ordering clinician or representative by the Radiology Department at the imaging location. Electronically Signed   By: Sebastian Ache M.D.   On: 10/27/2019 10:27   US Carotid Duplex  Bilateral  Result Date: 10/27/2019 CLINICAL DATA:  79 year old female with a history of headache weakness and aphasia EXAM: BILATERAL CAROTID DUPLEX ULTRASOUND TECHNIQUE: Wallace Cullens scale imaging, color Doppler and duplex ultrasound were performed of bilateral carotid and vertebral arteries in the neck. COMPARISON:  None. FINDINGS: Criteria: Quantification of carotid stenosis is based on velocity parameters that correlate the residual internal carotid diameter with NASCET-based stenosis levels, using the diameter of the distal internal carotid lumen as the denominator for stenosis measurement. The following velocity measurements were obtained: RIGHT ICA:  Systolic 64 cm/sec, Diastolic 19 cm/sec CCA:  108 cm/sec SYSTOLIC ICA/CCA RATIO:  0.6 ECA:  102 cm/sec LEFT ICA:  Systolic 100 cm/sec, Diastolic 25 cm/sec CCA:  102 cm/sec SYSTOLIC ICA/CCA RATIO:  1.0 ECA:  56 cm/sec Right Brachial SBP: Not acquired Left Brachial SBP: Not acquired RIGHT CAROTID ARTERY: No significant calcified disease of the right common carotid artery. Intermediate waveform maintained. Heterogeneous plaque without significant calcifications at the right carotid bifurcation. Low resistance waveform of the right ICA. No significant tortuosity. RIGHT VERTEBRAL ARTERY: Antegrade flow with low resistance waveform. LEFT CAROTID ARTERY: No significant calcified disease of the left common carotid artery. Intermediate waveform maintained. Heterogeneous plaque at the left carotid bifurcation without significant calcifications. Low resistance waveform of the left ICA. LEFT VERTEBRAL ARTERY:  Antegrade flow with low resistance waveform. IMPRESSION: Color duplex indicates minimal heterogeneous plaque, with no hemodynamically significant stenosis by duplex criteria in the extracranial cerebrovascular circulation. Signed, Yvone Neu. Reyne Dumas, RPVI [facility] Radiology Electronically Signed   By: Gilmer Mor D.O.    On: 10/27/2019 10:23   ECHOCARDIOGRAM COMPLETE  Result Date: 10/28/2019    ECHOCARDIOGRAM REPORT   Patient Name:   Kerry Richardson Date of Exam: 10/28/2019 Medical Rec #:  [id number]    Height:       60.0 in Accession #:    [id number]   Weight:       211.6 lb Date of Birth:  03-15-1941    BSA:          1.912 m Patient Age:    79 years     BP:  160/87 mmHg Patient Gender: F            HR:           72 bpm. Exam Location:  Inpatient Procedure: 2D Echo, Cardiac Doppler and Color Doppler Indications:    Stroke 434.91 / I163.9  History:        Patient has prior history of Echocardiogram examinations, most                 recent 07/11/2013. Signs/Symptoms:Dyspnea; Risk Factors:GERD.  Sonographer:    Elmarie Shiley Dance Referring Phys: [id number] Kasandra Knudsen PAHWANI IMPRESSIONS  1. Left ventricular ejection fraction, by estimation, is 60 to 65%. The left ventricle has normal function. The left ventricle has no regional wall motion abnormalities. Left ventricular diastolic parameters are consistent with Grade I diastolic dysfunction (impaired relaxation). Elevated left ventricular end-diastolic pressure.  2. Right ventricular systolic function is normal. The right ventricular size is normal. There is normal pulmonary artery systolic pressure. The estimated right ventricular systolic pressure is 26.0 mmHg.  3. The mitral valve is normal in structure. Trivial mitral valve regurgitation. No evidence of mitral stenosis.  4. The aortic valve is normal in structure. Aortic valve regurgitation is mild. No aortic stenosis is present. Aortic regurgitation PHT measures 674 msec.  5. The inferior vena cava is normal in size with greater than 50% respiratory variability, suggesting right atrial pressure of 3 mmHg. FINDINGS  Left Ventricle: Left ventricular ejection fraction, by estimation, is 60 to 65%. The left ventricle has normal function. The left ventricle has no regional wall motion abnormalities. The left ventricular internal cavity  size was normal in size. There is  no left ventricular hypertrophy. Left ventricular diastolic parameters are consistent with Grade I diastolic dysfunction (impaired relaxation). Elevated left ventricular end-diastolic pressure. Right Ventricle: The right ventricular size is normal. No increase in right ventricular wall thickness. Right ventricular systolic function is normal. There is normal pulmonary artery systolic pressure. The tricuspid regurgitant velocity is 2.40 m/s, and  with an assumed right atrial pressure of 3 mmHg, the estimated right ventricular systolic pressure is 26.0 mmHg. Left Atrium: Left atrial size was normal in size. Right Atrium: Right atrial size was normal in size. Pericardium: There is no evidence of pericardial effusion. Mitral Valve: The mitral valve is normal in structure. Normal mobility of the mitral valve leaflets. Trivial mitral valve regurgitation. No evidence of mitral valve stenosis. Tricuspid Valve: The tricuspid valve is normal in structure. Tricuspid valve regurgitation is mild . No evidence of tricuspid stenosis. Aortic Valve: The aortic valve is normal in structure. Aortic valve regurgitation is mild. Aortic regurgitation PHT measures 674 msec. No aortic stenosis is present. Pulmonic Valve: The pulmonic valve was normal in structure. Pulmonic valve regurgitation is not visualized. No evidence of pulmonic stenosis. Aorta: The aortic root is normal in size and structure. Venous: The inferior vena cava is normal in size with greater than 50% respiratory variability, suggesting right atrial pressure of 3 mmHg. IAS/Shunts: No atrial level shunt detected by color flow Doppler.  LEFT VENTRICLE PLAX 2D LVIDd:         4.20 cm  Diastology LVIDs:         2.50 cm  LV e' lateral:   7.33 cm/s LV PW:         1.10 cm  LV E/e' lateral: 14.1 LV IVS:        0.80 cm  LV e' medial:    7.80 cm/s LVOT diam:  1.70 cm  LV E/e' medial:  13.2 LV SV:         64 LV SV Index:   34 LVOT Area:     2.27  cm  RIGHT VENTRICLE             IVC RV Basal diam:  2.20 cm     IVC diam: 1.80 cm RV S prime:     14.00 cm/s TAPSE (M-mode): 2.2 cm LEFT ATRIUM             Index       RIGHT ATRIUM           Index LA diam:        3.20 cm 1.67 cm/m  RA Area:     11.70 cm LA Vol (A2C):   53.6 ml 28.03 ml/m RA Volume:   24.90 ml  13.02 ml/m LA Vol (A4C):   32.9 ml 17.21 ml/m LA Biplane Vol: 43.4 ml 22.70 ml/m  AORTIC VALVE LVOT Vmax:   121.00 cm/s LVOT Vmean:  83.350 cm/s LVOT VTI:    0.282 m AI PHT:      674 msec  AORTA Ao Root diam: 3.30 cm Ao Asc diam:  2.80 cm MITRAL VALVE                TRICUSPID VALVE MV Area (PHT): 2.83 cm     TR Peak grad:   23.0 mmHg MV Decel Time: 268 msec     TR Vmax:        240.00 cm/s MV E velocity: 103.00 cm/s MV A velocity: 125.00 cm/s  SHUNTS MV E/A ratio:  0.82         Systemic VTI:  0.28 m                             Systemic Diam: 1.70 cm Armanda Magic MD Electronically signed by Armanda Magic MD Signature Date/Time: 10/28/2019/1:31:26 PM    Final    CT ANGIO HEAD CODE STROKE  Result Date: 10/27/2019 CLINICAL DATA:  Stroke EXAM: CT ANGIOGRAPHY HEAD AND NECK TECHNIQUE: Multidetector CT imaging of the head and neck was performed using the standard protocol during bolus administration of intravenous contrast. Multiplanar CT image reconstructions and MIPs were obtained to evaluate the vascular anatomy. Carotid stenosis measurements (when applicable) are obtained utilizing NASCET criteria, using the distal internal carotid diameter as the denominator. CONTRAST:  OMNIPAQUE IOHEXOL 350 MG/ML SOLN COMPARISON:  MRI head 10/27/2019 FINDINGS: CT HEAD FINDINGS Brain: Ill-defined small hypodensity in the left posterior temporal lobe corresponding to acute or subacute infarct on diffusion-weighted imaging. No other areas of infarct. Negative for hemorrhage or mass. Negative for hydrocephalus. Vascular: Negative for hyperdense vessel Skull: Negative Sinuses: Paranasal sinuses clear. Orbits: Bilateral  cataract extraction.  No orbital lesion. Review of the MIP images confirms the above findings CTA NECK FINDINGS Aortic arch: Mild atherosclerotic calcification aortic arch. Proximal great vessels widely patent. Standard branching of the aortic arch. Right carotid system: Right carotid widely patent. No significant atherosclerotic disease. Left carotid system: Minimal atherosclerotic disease left carotid bifurcation without significant stenosis. Vertebral arteries: Both vertebral arteries patent to the basilar without significant stenosis. Skeleton: ACDF C4 through C7. Pseudarthrosis at C4-5. Solid fusion C5-6 and C6-7. Other neck: Negative for mass or adenopathy. Upper chest: Lung apices clear bilaterally. Review of the MIP images confirms the above findings CTA HEAD FINDINGS Anterior circulation: Cavernous carotid widely patent bilaterally with minimal  atherosclerotic calcification. Anterior and middle cerebral arteries patent bilaterally without large vessel occlusion. Mild atherosclerotic disease in the A2 segment bilaterally. Mild stenosis superior division right middle cerebral artery. Posterior circulation: Both vertebral arteries patent to the basilar. PICA not visualized. AICA patent bilaterally. Basilar widely patent. Superior cerebellar and posterior cerebral arteries patent bilaterally negative for significant stenosis or large vessel occlusion. Fetal origin of the left posterior cerebral artery. Venous sinuses: Normal venous enhancement Anatomic variants: None Review of the MIP images confirms the above findings IMPRESSION: 1. Small area of acute infarction in the left posterior temporal lobe with hypodensity on CT. No acute hemorrhage. 2. Negative for carotid or vertebral artery stenosis in the neck 3. Negative for intracranial large vessel occlusion 4. Atherosclerotic irregularity in the anterior cerebral arteries and the superior division left middle cerebral artery which appears unrelated to the area  of acute infarction Electronically Signed   By: Marlan Palau M.D.   On: 10/27/2019 22:27    Echocardiogram  Preserved EF. No findings to explain stroke  12-lead ECG 10/27/19- sinus with LAD All ekgs in epic are reviewed and reveal no arrhythmias  Telemetry sinus rhythm, no sustained afib detected,  Brief disorganized atrial activity was noted   Assessment and Plan 1. Cryptogenic stroke The patient presents with stroke of unknown source.  Workup has been unrevealing thus far.  There is concern for cardioembolic source. I agree with Dr Roda Shutters that loop recorder insertion to monitor for AF is appropriate. The indication for loop recorder insertion / monitoring for AF in setting of cryptogenic stroke was discussed with the patient. The loop recorder insertion procedure was reviewed in detail including risks and benefits. These risks include but are not limited to bleeding and infection. The patient expressed verbal understanding and agrees to proceed. The patient was also counseled regarding wound care and device follow-up.  We will place ILR in the office on my office on 10/31/2019 at 2:20 pm.  She is aware to present to my office at that time.  My office will arrange any prior authorization that might be required.  Electrophysiology team to see as needed while here. Please call with questions. Ok to discharge from my standpoint.  Hillis Range MD, [facility] 10/28/2019 3:40 PM

## 2019-10-28 NOTE — Progress Notes (Signed)
  Echocardiogram 2D Echocardiogram has been performed.  Kerry Richardson 10/28/2019, 8:40 AM

## 2019-10-28 NOTE — Evaluation (Signed)
Occupational Therapy Evaluation Patient Details Name: Kerry Richardson MRN: [id number] DOB: September 28, 1940 Today's Date: 10/28/2019    History of Present Illness This 79 y.o. female admitted from PCP office with mild confusion and word finding difficulties.  MRI of brain showed:  small early subacute MCA territory infarcts, mild chronic small vessel ischemic disease.  CTA negative for LVO or carotid and vertebral artery stenosis. PMH includes:   migraine headaches; DOE, bulging discs with back pain, s/p cervical fusion, s/p CTR.    Clinical Impression   Patient evaluated by Occupational Therapy with no further acute OT needs identified. All education has been completed and the patient has no further questions. Pt appears to be at, or close to baseline.  She reports mild word finding difficulties.  She is able to perform ADLs mod I.  IADLs are somewhat limited by chronic back pain.  Pt also with DOE3/4 with activity, that she reports has been present for 5-6 mos.   She lives alone and was fully independent PTA.  See below for any follow-up Occupational Therapy or equipment needs. OT is signing off. Thank you for this referral.      Follow Up Recommendations  No OT follow up    Equipment Recommendations  None recommended by OT    Recommendations for Other Services       Precautions / Restrictions Precautions Precautions: None      Mobility Bed Mobility Overal bed mobility: Independent                Transfers Overall transfer level: Independent                    Balance Overall balance assessment: Mild deficits observed, not formally tested                                         ADL either performed or assessed with clinical judgement   ADL Overall ADL's : Modified independent                                       General ADL Comments: pt able to perform tub transfer mod I      Vision Baseline Vision/History: Wears  glasses Wears Glasses: Reading only Patient Visual Report: No change from baseline Vision Assessment?: Yes Eye Alignment: Within Functional Limits Ocular Range of Motion: Within Functional Limits Alignment/Gaze Preference: Within Defined Limits Tracking/Visual Pursuits: Able to track stimulus in all quads without difficulty Saccades: Within functional limits Convergence: Within functional limits     Perception Perception Perception Tested?: Yes   Praxis Praxis Praxis tested?: Within functional limits    Pertinent Vitals/Pain Pain Assessment: 0-10 Pain Score: 7  Pain Location: back  Pain Descriptors / Indicators: Aching;Grimacing;Guarding Pain Intervention(s): Limited activity within patient's tolerance;Monitored during session     Hand Dominance Right   Extremity/Trunk Assessment Upper Extremity Assessment Upper Extremity Assessment: Overall WFL for tasks assessed   Lower Extremity Assessment Lower Extremity Assessment: Defer to PT evaluation   Cervical / Trunk Assessment Cervical / Trunk Assessment: Lordotic   Communication Communication Communication: Expressive difficulties (Pt reports mild word finding difficulties )   Cognition Arousal/Alertness: Awake/alert Behavior During Therapy: WFL for tasks assessed/performed Overall Cognitive Status: Within Functional Limits for tasks assessed  General Comments: pt able to follow a 4 step command independently with distractions provided. She scored 0/28 on the Short Blessed Test (no errors) while ambulating and with distraction    General Comments  pt c/o back pain while ambulating in hallway >250'.  DOE 3/4     Exercises     Shoulder Instructions      Home Living Family/patient expects to be discharged to:: Private residence Living Arrangements: Alone   Type of Home: House Home Access: Stairs to enter [employer] of Steps: 5 Entrance Stairs-Rails:  Right;Left Home Layout: Two level;Able to live on main level with bedroom/bathroom     Bathroom Shower/Tub: Chief Strategy Officer: Standard     Home Equipment: None          Prior Functioning/Environment Level of Independence: Independent        Comments: Pt reports she is fully independent, but does modify her activity due to chronic back pain.  She denies h/o falls.  uses a grocery cart in the grocery store due to back pain         OT Problem List: Decreased strength;Decreased range of motion;Pain      OT Treatment/Interventions:      OT Goals(Current goals can be found in the care plan section) Acute Rehab OT Goals Patient Stated Goal: to go home and sleep  OT Goal Formulation: All assessment and education complete, DC therapy  OT Frequency:     Barriers to D/C:            Co-evaluation              AM-PAC OT "6 Clicks" Daily Activity     Outcome Measure Help from another person eating meals?: None Help from another person taking care of personal grooming?: None Help from another person toileting, which includes using toliet, bedpan, or urinal?: None Help from another person bathing (including washing, rinsing, drying)?: None Help from another person to put on and taking off regular upper body clothing?: None Help from another person to put on and taking off regular lower body clothing?: None 6 Click Score: 24   End of Session Nurse Communication: Mobility status  Activity Tolerance: Patient limited by pain Patient left: in bed;with call bell/phone within reach  OT Visit Diagnosis: Pain Pain - part of body:  (back )                Time: 0940-1003 OT Time Calculation (min): 23 min Charges:  OT General Charges $OT Visit: 1 Visit OT Evaluation $OT Eval Low Complexity: 1 Low OT Treatments $Self Care/Home Management : 8-22 mins  Eber Jones., OTR/L Acute Rehabilitation Services Pager [phone] Office [phone]   Jeani Hawking  M 10/28/2019, 10:22 AM

## 2019-10-31 ENCOUNTER — Ambulatory Visit: Payer: Medicare Other | Admitting: Internal Medicine

## 2019-10-31 ENCOUNTER — Other Ambulatory Visit: Payer: Self-pay

## 2019-10-31 ENCOUNTER — Encounter: Payer: Self-pay | Admitting: Internal Medicine

## 2019-10-31 VITALS — BP 174/80 | HR 90 | Ht 60.0 in | Wt 211.0 lb

## 2019-10-31 DIAGNOSIS — I10 Essential (primary) hypertension: Secondary | ICD-10-CM | POA: Diagnosis not present

## 2019-10-31 DIAGNOSIS — I639 Cerebral infarction, unspecified: Secondary | ICD-10-CM

## 2019-10-31 HISTORY — PX: OTHER SURGICAL HISTORY: SHX169

## 2019-10-31 NOTE — Progress Notes (Signed)
PCP: Annalee Genta, DO   Primary EP: Dr Johney Frame  Kerry Richardson is a 79 y.o. female who presents today for routine electrophysiology followup.  Since last being seen in our clinic, the patient reports doing reasonably well.  She was recently hospitalized with stroke.  She and I discussed ILR implantation for further evaluation of AF as a possible cause for her stroke.  Today, she denies symptoms of palpitations, chest pain, shortness of breath,  lower extremity edema, dizziness, presyncope, or syncope.  The patient is otherwise without complaint today.   Past Medical History:  Diagnosis Date   Allergy    chronic   Alpha galactosidase deficiency    Arthritis    Back pain    Bulging discs    Bursitis    DDD (degenerative disc disease)    Dyspnea    with exertion   GERD (gastroesophageal reflux disease)    mild   Glaucoma    H/O removal of cyst    finger (uncertain of which finger)   Migraine headache    Neck pain    PONV (postoperative nausea and vomiting)    Reflux    Seasonal allergies    Past Surgical History:  Procedure Laterality Date   ABDOMINAL HYSTERECTOMY     CARPAL TUNNEL RELEASE     bilateral   CATARACT EXTRACTION W/PHACO Left 10/01/2014   Procedure: CATARACT EXTRACTION PHACO AND INTRAOCULAR LENS PLACEMENT LEFT EYE;  Surgeon: Gemma Payor, MD;  Location: [facility];  Service: Ophthalmology;  Laterality: Left;  CDE:8.15   CATARACT EXTRACTION W/PHACO Right 08/02/2017   Procedure: CATARACT EXTRACTION WITH PHACOEMULSIFICATION  AND INTRAOCULAR LENS PLACEMENT AND PLACEMENT OF iSTENT GLAUCOMA DEVICE RIGHT EYE;  Surgeon: Gemma Payor, MD;  Location: [facility];  Service: Ophthalmology;  Laterality: Right;  CDE: 7.77   CERVICAL FUSION     CHOLECYSTECTOMY N/A 05/15/2013   Procedure: LAPAROSCOPIC CHOLECYSTECTOMY WITH INTRAOPERATIVE CHOLANGIOGRAM;  Surgeon: Dalia Heading, MD;  Location: [facility];  Service: General;  Laterality: N/A;   CHONDROPLASTY  03/14/2012    Procedure: CHONDROPLASTY;  Surgeon: Vickki Hearing, MD;  Location: [facility];  Service: Orthopedics;  Laterality: Left;  medial condyle   CHONDROPLASTY Right 12/23/2012   Procedure: KNEE ARTHROSCOPY WITH CHONDROPLASTY WITH LIMITED DEBRIDMENT;  Surgeon: Vickki Hearing, MD;  Location: [facility];  Service: Orthopedics;  Laterality: Right;   FOOT SURGERY Bilateral    morton's neuroma   KNEE ARTHROSCOPY WITH MEDIAL MENISECTOMY  03/14/2012   Procedure: KNEE ARTHROSCOPY WITH MEDIAL MENISECTOMY;  Surgeon: Vickki Hearing, MD;  Location: [facility];  Service: Orthopedics;  Laterality: Left;    ROS- all systems are reviewed and negatives except as per HPI above  Current Outpatient Medications  Medication Sig Dispense Refill   amLODipine (NORVASC) 5 MG tablet Take 1 tablet (5 mg total) by mouth daily. 30 tablet 3   aspirin EC 81 MG EC tablet Take 1 tablet (81 mg total) by mouth daily. Swallow whole. 30 tablet 11   Cholecalciferol (VITAMIN D) 125 MCG (5000 UT) CAPS Take 5,000 Units by mouth daily.      clopidogrel (PLAVIX) 75 MG tablet Take 1 tablet (75 mg total) by mouth daily for 21 days. 21 tablet 0   ketoconazole (NIZORAL) 2 % cream APPLY TO AFFECTED AREA TWICE DAILY. (Patient taking differently: Apply 1 application topically daily. For prevention of abdominal rash) 60 g 5   NIACINAMIDE PO Take 1 tablet by mouth daily.     OVER THE  COUNTER MEDICATION Take 2 capsules by mouth daily. Lion's Mane Mushroom     triamcinolone cream (KENALOG) 0.1 % Apply 1 application topically 2 (two) times daily as needed (itching).      Zinc 50 MG TABS Take 50 mg by mouth daily.     No current facility-administered medications for this visit.    Physical Exam: Vitals:   10/31/19 1438  BP: (!) 174/80  Pulse: 90  SpO2: 97%  Weight: 211 lb (95.7 kg)  Height: 5' (1.524 m)    GEN- The patient is well appearing, alert and oriented x 3 today.   Head- normocephalic, atraumatic Eyes-  Sclera clear,  conjunctiva pink Ears- hearing intact Oropharynx- clear Lungs-   normal work of breathing Heart- Regular rate and rhythm  GI- soft, NT, ND, + BS Extremities- no clubbing, cyanosis, or edema  Wt Readings from Last 3 Encounters:  10/31/19 211 lb (95.7 kg)  10/27/19 211 lb 10.3 oz (96 kg)  10/25/19 212 lb 12.8 oz (96.5 kg)     Assessment and Plan:  1. Cryptogenic stroke The patient recently had an embolic stroke of unknown cause.  I agree with Dr Roda Shutters that there is concern for AF as a possible cause for her stroke.  She has worn monitoring which did not reveal arrhythmias. I would therefore advise implantation of an implantable loop recorder for long term arrhythmia monitoring.  Risks and benefits to ILR were discussed at length with the patient today, including but not limited to risks of bleeding and infection.  Extensive device education was performed.  Remote monitoring was also discussed at length today.  The patient understands and wishes to proceed.  We will proceed at this time with ILR implantation.   2. HTN BP elevated today I have advised that she increase norvasc to 10mg  daily however she is not ready to do this today.  She states that she will follow her BP closely at home and contact our office if it remains elevated.   MD, [facility] 10/31/2019 2:47 PM      DESCRIPTION OF PROCEDURE:  Informed written consent was obtained.  The patient required no sedation for the procedure today.  The patients left chest was prepped and draped. Mapping over the patient's chest was performed to identify the appropriate ILR site.  This area was found to be the left parasternal region over the 3rd-4th intercostal space.  The skin overlying this region was infiltrated with lidocaine for local analgesia.  A 0.5-cm incision was made at the implant site.  A subcutaneous ILR pocket was fashioned using a combination of sharp and blunt dissection.  A Medtronic Reveal Linq model LNQ 11 (SN  12/31/2019 S) implantable loop recorder was then placed into the pocket R waves were very prominent and measured > 0.2 mV. EBL<1 ml.  Steri- Strips and a sterile dressing were then applied.  There were no early apparent complications.     CONCLUSIONS:   1. Successful implantation of a Medtronic Reveal LINQ implantable loop recorder for cryptogenic stroke  2. No early apparent complications.   [id number] MD, [facility] 10/31/2019 2:47 PM

## 2019-10-31 NOTE — Patient Instructions (Addendum)
Medication Instructions:  Your physician recommends that you continue on your current medications as directed. Please refer to the Current Medication list given to you today.  Labwork: None ordered.  Testing/Procedures: None ordered.  Follow-Up:  Your physician wants you to follow-up in: as needed with Dr. Allred.      Implantable Loop Recorder Placement, Care After This sheet gives you information about how to care for yourself after your procedure. Your health care provider may also give you more specific instructions. If you have problems or questions, contact your health care provider. What can I expect after the procedure? After the procedure, it is common to have:  Soreness or discomfort near the incision.  Some swelling or bruising near the incision.  Follow these instructions at home: Incision care  1.  Leave your outer dressing on for 24 hours.  After 24 hours you can remove your outer dressing and shower. 2. Leave adhesive strips in place. These skin closures may need to stay in place for 1-2 weeks. If adhesive strip edges start to loosen and curl up, you may trim the loose edges.  You may remove the strips if they have not fallen off after 2 weeks. 3. Check your incision area every day for signs of infection. Check for: a. Redness, swelling, or pain. b. Fluid or blood. c. Warmth. d. Pus or a bad smell. 4. Do not take baths, swim, or use a hot tub until your incision is completely healed. 5. If your wound site starts to bleed apply pressure.      If you have any questions/concerns please call the device clinic at [phone].  Activity  Return to your normal activities.  General instructions  Follow instructions from your health care provider about how to manage your implantable loop recorder and transmit the information. Learn how to activate a recording if this is necessary for your type of device.  Do not go through a metal detection gate, and do not let  someone hold a metal detector over your chest. Show your ID card.  Do not have an MRI unless you check with your health care provider first.  Take over-the-counter and prescription medicines only as told by your health care provider.  Keep all follow-up visits as told by your health care provider. This is important. Contact a health care provider if:  You have redness, swelling, or pain around your incision.  You have a fever.  You have pain that is not relieved by your pain medicine.  You have triggered your device because of fainting (syncope) or because of a heartbeat that feels like it is racing, slow, fluttering, or skipping (palpitations). Get help right away if you have:  Chest pain.  Difficulty breathing. Summary  After the procedure, it is common to have soreness or discomfort near the incision.  Change your dressing as told by your health care provider.  Follow instructions from your health care provider about how to manage your implantable loop recorder and transmit the information.  Keep all follow-up visits as told by your health care provider. This is important. This information is not intended to replace advice given to you by your health care provider. Make sure you discuss any questions you have with your health care provider. Document Released: 02/18/2015 Document Revised: 04/24/2017 Document Reviewed: 04/24/2017 Elsevier Patient Education  2020 Elsevier Inc.   

## 2019-11-08 ENCOUNTER — Ambulatory Visit (INDEPENDENT_AMBULATORY_CARE_PROVIDER_SITE_OTHER): Payer: Medicare Other | Admitting: Family Medicine

## 2019-11-08 ENCOUNTER — Other Ambulatory Visit: Payer: Self-pay

## 2019-11-08 ENCOUNTER — Encounter: Payer: Self-pay | Admitting: Family Medicine

## 2019-11-08 VITALS — BP 126/82 | HR 70 | Temp 98.7°F | Ht 60.0 in | Wt 208.2 lb

## 2019-11-08 DIAGNOSIS — I639 Cerebral infarction, unspecified: Secondary | ICD-10-CM

## 2019-11-08 DIAGNOSIS — E785 Hyperlipidemia, unspecified: Secondary | ICD-10-CM

## 2019-11-08 DIAGNOSIS — I5031 Acute diastolic (congestive) heart failure: Secondary | ICD-10-CM

## 2019-11-08 NOTE — Progress Notes (Signed)
Patient ID: Kerry Richardson, female    DOB: 05/05/1940, 79 y.o.   MRN: [id number]   Chief Complaint  Patient presents with  . Hospitalization Follow-up    CVA- multiple mimi strokes   Subjective:    HPI   F/u hospital dc for ischemic CVA.  Admit date: 10/27/2019 Discharge date: 10/28/2019  -pt feeling exhausted from the hospitalization.  -pt had event monitor placed, needs f/u cardiology. - pt had event monitor put into chest.  Needing f/u for evaluation.  After church after lunch had a bad headache, and then it wouldn't resolve. Then Monday couldn't get thought together and couldn't have convo. tues slightly better. Son called office,  Saw Dr. Lorin Picket.  Pt very resistant to taking lipitor, was prescribed 40mg  lipitor at dc.  Pt stating her family was on it and it causes "other problems" that she doesn't want.  She doesn't have a specific reason.  Reviewed with pt the concerns of not taking a statin can inc her chances of having a stroke in the next 30 days.  She's already high risk.  Pt stating "I'm not going to change her mind about taking it." was dc with the medication and refused to pick it up from pharmacy. Pt declined to inc the norvasc from 5mg  to 10mg  as discussed with cards on last visit.  Pt stating she wants to check bp at home and will call if having high numbers.  Pt just taking the 5mg  norvasc daily.  HLD- in 2/21- chol- 250 and LDL 156, on 8/721- cholesterol 225, and LDL 135.   CVA- Pt was advised to take plavix and asa for 3 wks, then discontinue the plavix and stay on 81mg  baby asa daily. No residual deficits.  Has occ trouble with getting her words together.  But not having any weakness in limbs, slurring of speech, headache, or dizziness.  Pt to f/u with neuro in 2 wks.   Medical History Kerry Richardson has a past medical history of Allergy, Alpha galactosidase deficiency, Arthritis, Back pain, Bulging discs, Bursitis, DDD (degenerative disc disease), Dyspnea, GERD  (gastroesophageal reflux disease), Glaucoma, H/O removal of cyst, Migraine headache, Neck pain, PONV (postoperative nausea and vomiting), Reflux, and Seasonal allergies.   Outpatient Encounter Medications as of 11/08/2019  Medication Sig  . amLODipine (NORVASC) 5 MG tablet Take 1 tablet (5 mg total) by mouth daily.  3/21 aspirin EC 81 MG EC tablet Take 1 tablet (81 mg total) by mouth daily. Swallow whole.  . Cholecalciferol (VITAMIN D) 125 MCG (5000 UT) CAPS Take 5,000 Units by mouth daily.   . clopidogrel (PLAVIX) 75 MG tablet Take 1 tablet (75 mg total) by mouth daily for 21 days.  05-31-1987 ketoconazole (NIZORAL) 2 % cream APPLY TO AFFECTED AREA TWICE DAILY. (Patient taking differently: Apply 1 application topically daily. For prevention of abdominal rash)  . NIACINAMIDE PO Take 1 tablet by mouth daily.  OVER THE COUNTER MEDICATION Take 2 capsules by mouth daily. Lion's Mane Mushroom  . triamcinolone cream (KENALOG) 0.1 % Apply 1 application topically 2 (two) times daily as needed (itching).   . Zinc 50 MG TABS Take 50 mg by mouth daily.   No facility-administered encounter medications on file as of 11/08/2019.     Review of Systems  Constitutional: Negative for chills and fever.  HENT: Negative for congestion, rhinorrhea and sore throat.   Respiratory: Negative for cough, shortness of breath and wheezing.   Cardiovascular: Negative for chest pain and leg swelling.  Gastrointestinal: Negative for abdominal pain, diarrhea, nausea and vomiting.  Genitourinary: Negative for dysuria and frequency.  Musculoskeletal: Negative for arthralgias and back pain.  Skin: Negative for rash.  Neurological: Negative for dizziness, weakness and headaches.     Vitals BP 126/82   Pulse 70   Temp 98.7 F (37.1 C) (Oral)   Ht 5' (1.524 m)   Wt 208 lb 3.2 oz (94.4 kg)   SpO2 95%   BMI 40.66 kg/m   Objective:   Physical Exam Vitals and nursing note reviewed.  Constitutional:      Appearance: Normal  appearance.  HENT:     Head: Normocephalic and atraumatic.     Nose: Nose normal.     Mouth/Throat:     Mouth: Mucous membranes are moist.     Pharynx: Oropharynx is clear.  Eyes:     Extraocular Movements: Extraocular movements intact.     Conjunctiva/sclera: Conjunctivae normal.     Pupils: Pupils are equal, round, and reactive to light.  Cardiovascular:     Rate and Rhythm: Normal rate and regular rhythm.     Pulses: Normal pulses.     Heart sounds: Normal heart sounds.  Pulmonary:     Effort: Pulmonary effort is normal.     Breath sounds: Normal breath sounds. No wheezing, rhonchi or rales.  Musculoskeletal:        General: Normal range of motion.     Right lower leg: No edema.     Left lower leg: No edema.  Skin:    General: Skin is warm and dry.     Findings: No lesion or rash.  Neurological:     General: No focal deficit present.     Mental Status: She is alert and oriented to person, place, and time.     Cranial Nerves: No cranial nerve deficit.     Sensory: No sensory deficit.     Motor: No weakness.     Gait: Gait normal.  Psychiatric:        Mood and Affect: Mood normal.        Behavior: Behavior normal.      Assessment and Plan   1. Acute CVA (cerebrovascular accident) (HCC)  2. Dyslipidemia  3. Acute diastolic CHF (congestive heart failure) (HCC)   CVA- cont with asa and plavix.  F/u with neuro in 2 wks. Gave number to cards and neuro.    Cardiology- we got scheudle of her loop recorder readings and no f/u at this time unless pt needing something sooner.  They will call her if they see an irregular reading. Reviewed risk vs benefits of taking statin for her recent CVA.  Pt not wanting to take this medication.  Pt stating she will take the asa and plavix.  Pt given the schedule of her loop recorder dates.  F/u 21mo or prn.

## 2019-11-08 NOTE — Progress Notes (Signed)
   Patient ID: Kerry Richardson, female    DOB: 1940/11/05, 79 y.o.   MRN: [id number]   Chief Complaint  Patient presents with  . Hospitalization Follow-up    CVA- multiple mimi strokes   Subjective:   Patient Refused Lipitor at discharge HPI   Medical History Kerry Richardson has a past medical history of Allergy, Alpha galactosidase deficiency, Arthritis, Back pain, Bulging discs, Bursitis, DDD (degenerative disc disease), Dyspnea, GERD (gastroesophageal reflux disease), Glaucoma, H/O removal of cyst, Migraine headache, Neck pain, PONV (postoperative nausea and vomiting), Reflux, and Seasonal allergies.   Outpatient Encounter Medications as of 11/08/2019  Medication Sig  . amLODipine (NORVASC) 5 MG tablet Take 1 tablet (5 mg total) by mouth daily.  Marland Kitchen aspirin EC 81 MG EC tablet Take 1 tablet (81 mg total) by mouth daily. Swallow whole.  . Cholecalciferol (VITAMIN D) 125 MCG (5000 UT) CAPS Take 5,000 Units by mouth daily.   . clopidogrel (PLAVIX) 75 MG tablet Take 1 tablet (75 mg total) by mouth daily for 21 days.  Marland Kitchen ketoconazole (NIZORAL) 2 % cream APPLY TO AFFECTED AREA TWICE DAILY. (Patient taking differently: Apply 1 application topically daily. For prevention of abdominal rash)  . NIACINAMIDE PO Take 1 tablet by mouth daily.  Marland Kitchen OVER THE COUNTER MEDICATION Take 2 capsules by mouth daily. Lion's Mane Mushroom  . triamcinolone cream (KENALOG) 0.1 % Apply 1 application topically 2 (two) times daily as needed (itching).   . Zinc 50 MG TABS Take 50 mg by mouth daily.   No facility-administered encounter medications on file as of 11/08/2019.     Review of Systems   Vitals BP 126/82   Pulse 70   Temp 98.7 F (37.1 C) (Oral)   Ht 5' (1.524 m)   Wt 208 lb 3.2 oz (94.4 kg)   SpO2 95%   BMI 40.66 kg/m   Objective:   Physical Exam   Assessment and Plan   There are no diagnoses linked to this encounter.

## 2019-11-08 NOTE — Patient Instructions (Addendum)
Call cardiology and neurology for your follow up visit.  -[email or web address]  For healthy eating tips.    Fat and Cholesterol Restricted Eating Plan Getting too much fat and cholesterol in your diet may cause health problems. Choosing the right foods helps keep your fat and cholesterol at normal levels. This can keep you from getting certain diseases. Your doctor may recommend an eating plan that includes:  Total fat: ______% or less of total calories a day.  Saturated fat: ______% or less of total calories a day.  Cholesterol: less than _________mg a day.  Fiber: ______g a day. What are tips for following this plan? Meal planning  At meals, divide your plate into four equal parts: ? Fill one-half of your plate with vegetables and green salads. ? Fill one-fourth of your plate with whole grains. ? Fill one-fourth of your plate with low-fat (lean) protein foods.  Eat fish that is high in omega-3 fats at least two times a week. This includes mackerel, tuna, sardines, and salmon.  Eat foods that are high in fiber, such as whole grains, beans, apples, broccoli, carrots, peas, and barley. General tips   Work with your doctor to lose weight if you need to.  Avoid: ? Foods with added sugar. ? Fried foods. ? Foods with partially hydrogenated oils.  Limit alcohol intake to no more than 1 drink a day for nonpregnant women and 2 drinks a day for men. One drink equals 12 oz of beer, 5 oz of wine, or 1 oz of hard liquor. Reading food labels  Check food labels for: ? Trans fats. ? Partially hydrogenated oils. ? Saturated fat (g) in each serving. ? Cholesterol (mg) in each serving. ? Fiber (g) in each serving.  Choose foods with healthy fats, such as: ? Monounsaturated fats. ? Polyunsaturated fats. ? Omega-3 fats.  Choose grain products that have whole grains. Look for the word "whole" as the first word in the ingredient list. Cooking  Cook foods using low-fat methods. These  include baking, boiling, grilling, and broiling.  Eat more home-cooked foods. Eat at restaurants and buffets less often.  Avoid cooking using saturated fats, such as butter, cream, palm oil, palm kernel oil, and coconut oil. Recommended foods  Fruits  All fresh, canned (in natural juice), or frozen fruits. Vegetables  Fresh or frozen vegetables (raw, steamed, roasted, or grilled). Green salads. Grains  Whole grains, such as whole wheat or whole grain breads, crackers, cereals, and pasta. Unsweetened oatmeal, bulgur, barley, quinoa, or brown rice. Corn or whole wheat flour tortillas. Meats and other protein foods  Ground beef (85% or leaner), grass-fed beef, or beef trimmed of fat. Skinless chicken or [place]. Ground chicken or [place]. Pork trimmed of fat. All fish and seafood. Egg whites. Dried beans, peas, or lentils. Unsalted nuts or seeds. Unsalted canned beans. Nut butters without added sugar or oil. Dairy  Low-fat or nonfat dairy products, such as skim or 1% milk, 2% or reduced-fat cheeses, low-fat and fat-free ricotta or cottage cheese, or plain low-fat and nonfat yogurt. Fats and oils  Tub margarine without trans fats. Light or reduced-fat mayonnaise and salad dressings. Avocado. Olive, canola, sesame, or safflower oils. The items listed above may not be a complete list of foods and beverages you can eat. Contact a dietitian for more information. Foods to avoid Fruits  Canned fruit in heavy syrup. Fruit in cream or butter sauce. Fried fruit. Vegetables  Vegetables cooked in cheese, cream, or butter sauce. Fried vegetables. Grains  White bread. White pasta. White rice. Cornbread. Bagels, pastries, and croissants. Crackers and snack foods that contain trans fat and hydrogenated oils. Meats and other protein foods  Fatty cuts of meat. Ribs, chicken wings, bacon, sausage, bologna, salami, chitterlings, fatback, hot dogs, bratwurst, and packaged lunch meats. Liver and organ  meats. Whole eggs and egg yolks. Chicken and [place] with skin. Fried meat. Dairy  Whole or 2% milk, cream, half-and-half, and cream cheese. Whole milk cheeses. Whole-fat or sweetened yogurt. Full-fat cheeses. Nondairy creamers and whipped toppings. Processed cheese, cheese spreads, and cheese curds. Beverages  Alcohol. Sugar-sweetened drinks such as sodas, lemonade, and fruit drinks. Fats and oils  Butter, stick margarine, lard, shortening, ghee, or bacon fat. Coconut, palm kernel, and palm oils. Sweets and desserts  Corn syrup, sugars, honey, and molasses. Candy. Jam and jelly. Syrup. Sweetened cereals. Cookies, pies, cakes, donuts, muffins, and ice cream. The items listed above may not be a complete list of foods and beverages you should avoid. Contact a dietitian for more information. Summary  Choosing the right foods helps keep your fat and cholesterol at normal levels. This can keep you from getting certain diseases.  At meals, fill one-half of your plate with vegetables and green salads.  Eat high-fiber foods, like whole grains, beans, apples, carrots, peas, and barley.  Limit added sugar, saturated fats, alcohol, and fried foods. This information is not intended to replace advice given to you by your health care provider. Make sure you discuss any questions you have with your health care provider. Document Revised: 11/10/2017 Document Reviewed: 11/24/2016 Elsevier Patient Education  2020 [employer].        Follow-up Information        Hillis Range, MD Follow up in 1 week(s).   Specialty: Cardiology Contact information: [address] [place] [address] [phone]             [facility]. Schedule an appointment as soon as possible for a visit in 4 week(s).   Specialty: Neurology Contact information: [address] [place] [address] [phone]

## 2019-11-13 ENCOUNTER — Telehealth: Payer: Self-pay | Admitting: Family Medicine

## 2019-11-13 NOTE — Telephone Encounter (Signed)
follow-up sometime this week unless patient is having blurred vision then she would need to be seen right away

## 2019-11-13 NOTE — Telephone Encounter (Signed)
Pt contacted. Pt states that she is not having blurred vision. Transferred up front to set up appt

## 2019-11-13 NOTE — Telephone Encounter (Signed)
Pt wanted to call and let Doctor know she was getting out of the shower and notice she has a black eye. Blood pressure normal, she has not fell, and her son wanted to make sure the doctor knew.   Pt call back [phone]

## 2019-11-15 ENCOUNTER — Encounter: Payer: Self-pay | Admitting: Family Medicine

## 2019-11-15 ENCOUNTER — Other Ambulatory Visit: Payer: Self-pay

## 2019-11-15 ENCOUNTER — Ambulatory Visit (INDEPENDENT_AMBULATORY_CARE_PROVIDER_SITE_OTHER): Payer: Medicare Other | Admitting: Family Medicine

## 2019-11-15 VITALS — BP 128/70 | HR 86 | Temp 97.7°F | Ht 60.0 in | Wt 205.0 lb

## 2019-11-15 DIAGNOSIS — T148XXA Other injury of unspecified body region, initial encounter: Secondary | ICD-10-CM

## 2019-11-15 DIAGNOSIS — R238 Other skin changes: Secondary | ICD-10-CM | POA: Diagnosis not present

## 2019-11-15 NOTE — Patient Instructions (Signed)
DASH Eating Plan DASH stands for "Dietary Approaches to Stop Hypertension." The DASH eating plan is a healthy eating plan that has been shown to reduce high blood pressure (hypertension). It may also reduce your risk for type 2 diabetes, heart disease, and stroke. The DASH eating plan may also help with weight loss. What are tips for following this plan?  General guidelines  Avoid eating more than 2,300 mg (milligrams) of salt (sodium) a day. If you have hypertension, you may need to reduce your sodium intake to 1,500 mg a day.  Limit alcohol intake to no more than 1 drink a day for nonpregnant women and 2 drinks a day for men. One drink equals 12 oz of beer, 5 oz of wine, or 1 oz of hard liquor.  Work with your health care provider to maintain a healthy body weight or to lose weight. Ask what an ideal weight is for you.  Get at least 30 minutes of exercise that causes your heart to beat faster (aerobic exercise) most days of the week. Activities may include walking, swimming, or biking.  Work with your health care provider or diet and nutrition specialist (dietitian) to adjust your eating plan to your individual calorie needs. Reading food labels   Check food labels for the amount of sodium per serving. Choose foods with less than 5 percent of the Daily Value of sodium. Generally, foods with less than 300 mg of sodium per serving fit into this eating plan.  To find whole grains, look for the word "whole" as the first word in the ingredient list. Shopping  Buy products labeled as "low-sodium" or "no salt added."  Buy fresh foods. Avoid canned foods and premade or frozen meals. Cooking  Avoid adding salt when cooking. Use salt-free seasonings or herbs instead of table salt or sea salt. Check with your health care provider or pharmacist before using salt substitutes.  Do not fry foods. Cook foods using healthy methods such as baking, boiling, grilling, and broiling instead.  Cook with  heart-healthy oils, such as olive, canola, soybean, or sunflower oil. Meal planning  Eat a balanced diet that includes: ? 5 or more servings of fruits and vegetables each day. At each meal, try to fill half of your plate with fruits and vegetables. ? Up to 6-8 servings of whole grains each day. ? Less than 6 oz of lean meat, poultry, or fish each day. A 3-oz serving of meat is about the same size as a deck of cards. One egg equals 1 oz. ? 2 servings of low-fat dairy each day. ? A serving of nuts, seeds, or beans 5 times each week. ? Heart-healthy fats. Healthy fats called Omega-3 fatty acids are found in foods such as flaxseeds and coldwater fish, like sardines, salmon, and mackerel.  Limit how much you eat of the following: ? Canned or prepackaged foods. ? Food that is high in trans fat, such as fried foods. ? Food that is high in saturated fat, such as fatty meat. ? Sweets, desserts, sugary drinks, and other foods with added sugar. ? Full-fat dairy products.  Do not salt foods before eating.  Try to eat at least 2 vegetarian meals each week.  Eat more home-cooked food and less restaurant, buffet, and fast food.  When eating at a restaurant, ask that your food be prepared with less salt or no salt, if possible. What foods are recommended? The items listed may not be a complete list. Talk with your dietitian about   what dietary choices are best for you. Grains Whole-grain or whole-wheat bread. Whole-grain or whole-wheat pasta. Brown rice. Oatmeal. Quinoa. Bulgur. Whole-grain and low-sodium cereals. Pita bread. Low-fat, low-sodium crackers. Whole-wheat flour tortillas. Vegetables Fresh or frozen vegetables (raw, steamed, roasted, or grilled). Low-sodium or reduced-sodium tomato and vegetable juice. Low-sodium or reduced-sodium tomato sauce and tomato paste. Low-sodium or reduced-sodium canned vegetables. Fruits All fresh, dried, or frozen fruit. Canned fruit in natural juice (without  added sugar). Meat and other protein foods Skinless chicken or turkey. Ground chicken or turkey. Pork with fat trimmed off. Fish and seafood. Egg whites. Dried beans, peas, or lentils. Unsalted nuts, nut butters, and seeds. Unsalted canned beans. Lean cuts of beef with fat trimmed off. Low-sodium, lean deli meat. Dairy Low-fat (1%) or fat-free (skim) milk. Fat-free, low-fat, or reduced-fat cheeses. Nonfat, low-sodium ricotta or cottage cheese. Low-fat or nonfat yogurt. Low-fat, low-sodium cheese. Fats and oils Soft margarine without trans fats. Vegetable oil. Low-fat, reduced-fat, or light mayonnaise and salad dressings (reduced-sodium). Canola, safflower, olive, soybean, and sunflower oils. Avocado. Seasoning and other foods Herbs. Spices. Seasoning mixes without salt. Unsalted popcorn and pretzels. Fat-free sweets. What foods are not recommended? The items listed may not be a complete list. Talk with your dietitian about what dietary choices are best for you. Grains Baked goods made with fat, such as croissants, muffins, or some breads. Dry pasta or rice meal packs. Vegetables Creamed or fried vegetables. Vegetables in a cheese sauce. Regular canned vegetables (not low-sodium or reduced-sodium). Regular canned tomato sauce and paste (not low-sodium or reduced-sodium). Regular tomato and vegetable juice (not low-sodium or reduced-sodium). Pickles. Olives. Fruits Canned fruit in a light or heavy syrup. Fried fruit. Fruit in cream or butter sauce. Meat and other protein foods Fatty cuts of meat. Ribs. Fried meat. Bacon. Sausage. Bologna and other processed lunch meats. Salami. Fatback. Hotdogs. Bratwurst. Salted nuts and seeds. Canned beans with added salt. Canned or smoked fish. Whole eggs or egg yolks. Chicken or turkey with skin. Dairy Whole or 2% milk, cream, and half-and-half. Whole or full-fat cream cheese. Whole-fat or sweetened yogurt. Full-fat cheese. Nondairy creamers. Whipped toppings.  Processed cheese and cheese spreads. Fats and oils Butter. Stick margarine. Lard. Shortening. Ghee. Bacon fat. Tropical oils, such as coconut, palm kernel, or palm oil. Seasoning and other foods Salted popcorn and pretzels. Onion salt, garlic salt, seasoned salt, table salt, and sea salt. Worcestershire sauce. Tartar sauce. Barbecue sauce. Teriyaki sauce. Soy sauce, including reduced-sodium. Steak sauce. Canned and packaged gravies. Fish sauce. Oyster sauce. Cocktail sauce. Horseradish that you find on the shelf. Ketchup. Mustard. Meat flavorings and tenderizers. Bouillon cubes. Hot sauce and Tabasco sauce. Premade or packaged marinades. Premade or packaged taco seasonings. Relishes. Regular salad dressings. Where to find more information:  National Heart, Lung, and Blood Institute: www.nhlbi.nih.gov  American Heart Association: www.heart.org Summary  The DASH eating plan is a healthy eating plan that has been shown to reduce high blood pressure (hypertension). It may also reduce your risk for type 2 diabetes, heart disease, and stroke.  With the DASH eating plan, you should limit salt (sodium) intake to 2,300 mg a day. If you have hypertension, you may need to reduce your sodium intake to 1,500 mg a day.  When on the DASH eating plan, aim to eat more fresh fruits and vegetables, whole grains, lean proteins, low-fat dairy, and heart-healthy fats.  Work with your health care provider or diet and nutrition specialist (dietitian) to adjust your eating plan to your   individual calorie needs. This information is not intended to replace advice given to you by your health care provider. Make sure you discuss any questions you have with your health care provider. Document Revised: 02/19/2017 Document Reviewed: 03/02/2016 Elsevier Patient Education  2020 Elsevier Inc.  

## 2019-11-15 NOTE — Progress Notes (Signed)
   Subjective:    Patient ID: Kerry Richardson, female    DOB: 25-Nov-1940, 79 y.o.   MRN: [id number]  HPIpt woke up 2 days ago and had some bruising around right eye. Concerned because she did not hit anything. No eye pain. No blurry vision.  Patient with bruising around the eyes she has been on aspirin and Plavix ever since her stroke She denies any severe headache blurred vision vomiting diarrhea.  Patient is not taking statin we talked at length about this today she states she does not feel comfortable taking statin   Review of Systems  Constitutional: Negative for activity change, appetite change and fatigue.  HENT: Negative for congestion and rhinorrhea.   Respiratory: Negative for cough and shortness of breath.   Cardiovascular: Negative for chest pain and leg swelling.  Gastrointestinal: Negative for abdominal pain and diarrhea.  Endocrine: Negative for polydipsia and polyphagia.  Skin: Negative for color change.  Neurological: Negative for dizziness and weakness.  Psychiatric/Behavioral: Negative for behavioral problems and confusion.       Objective:   Physical Exam Vitals reviewed.  Constitutional:      General: She is not in acute distress. HENT:     Head: Normocephalic and atraumatic.  Eyes:     General:        Right eye: No discharge.        Left eye: No discharge.  Neck:     Trachea: No tracheal deviation.  Cardiovascular:     Rate and Rhythm: Normal rate and regular rhythm.     Heart sounds: Normal heart sounds. No murmur heard.   Pulmonary:     Effort: Pulmonary effort is normal. No respiratory distress.     Breath sounds: Normal breath sounds.  Lymphadenopathy:     Cervical: No cervical adenopathy.  Skin:    General: Skin is warm and dry.  Neurological:     Mental Status: She is alert.     Coordination: Coordination normal.  Psychiatric:        Behavior: Behavior normal.    Bruising around the right eye optic disc bilateral appears normal red reflex  normal       Assessment & Plan:  Hyperlipidemia-she is at risk of having more strokes I strongly encourage her to take a statin to reduce her risk of stroke by at least 40% even if she would just take statin 1 or 2 days a week she refuses to do so currently but she will think about it  She is seen neurology in the near future  Bruising around the eye I believe is related to being on aspirin and the Plavix I recommend that she go ahead and stop the Plavix symptoms only 2 days away and I highly recommend that she follow-up with [place] if any ongoing bruising troubles otherwise regular follow-up visits with fall

## 2019-11-16 LAB — CBC WITH DIFFERENTIAL/PLATELET
Basophils Absolute: 0 10*3/uL (ref 0.0–0.2)
Basos: 0 %
EOS (ABSOLUTE): 0.1 10*3/uL (ref 0.0–0.4)
Eos: 1 %
Hematocrit: 41.3 % (ref 34.0–46.6)
Hemoglobin: 13.3 g/dL (ref 11.1–15.9)
Immature Grans (Abs): 0 10*3/uL (ref 0.0–0.1)
Immature Granulocytes: 0 %
Lymphocytes Absolute: 1.6 10*3/uL (ref 0.7–3.1)
Lymphs: 19 %
MCH: 28.5 pg (ref 26.6–33.0)
MCHC: 32.2 g/dL (ref 31.5–35.7)
MCV: 89 fL (ref 79–97)
Monocytes Absolute: 0.5 10*3/uL (ref 0.1–0.9)
Monocytes: 6 %
Neutrophils Absolute: 6.1 10*3/uL (ref 1.4–7.0)
Neutrophils: 74 %
Platelets: 503 10*3/uL — ABNORMAL HIGH (ref 150–450)
RBC: 4.66 x10E6/uL (ref 3.77–5.28)
RDW: 13.3 % (ref 11.7–15.4)
WBC: 8.3 10*3/uL (ref 3.4–10.8)

## 2019-11-17 ENCOUNTER — Encounter: Payer: Self-pay | Admitting: Family Medicine

## 2019-11-21 ENCOUNTER — Encounter: Payer: Self-pay | Admitting: Adult Health

## 2019-11-21 ENCOUNTER — Other Ambulatory Visit: Payer: Self-pay

## 2019-11-21 ENCOUNTER — Ambulatory Visit: Payer: Medicare Other | Admitting: Adult Health

## 2019-11-21 VITALS — BP 148/68 | HR 74 | Ht 60.0 in | Wt 206.0 lb

## 2019-11-21 DIAGNOSIS — E785 Hyperlipidemia, unspecified: Secondary | ICD-10-CM

## 2019-11-21 DIAGNOSIS — Z5329 Procedure and treatment not carried out because of patient's decision for other reasons: Secondary | ICD-10-CM | POA: Diagnosis not present

## 2019-11-21 DIAGNOSIS — I10 Essential (primary) hypertension: Secondary | ICD-10-CM

## 2019-11-21 DIAGNOSIS — I639 Cerebral infarction, unspecified: Secondary | ICD-10-CM

## 2019-11-21 DIAGNOSIS — Z5320 Procedure and treatment not carried out because of patient's decision for unspecified reasons: Secondary | ICD-10-CM

## 2019-11-21 NOTE — Patient Instructions (Addendum)
Continue aspirin 81 mg daily  for secondary stroke prevention  Consider use of Zetia for cholesterol management. Further information provided  Loop recorder will continue to be monitored for possible atrial fibrillation  Continue to follow up with PCP regarding cholesterol and blood pressure management  Maintain strict control of hypertension with blood pressure goal below 130/90 and cholesterol with LDL cholesterol (bad cholesterol) goal below 70 mg/dL.      Followup in the future with me in 4 months or call earlier if needed       Thank you for coming to see [place] at [facility]. I hope we have been able to provide you high quality care today.  You may receive a patient satisfaction survey over the next few weeks. We would appreciate your feedback and comments so that we may continue to improve ourselves and the health of our patients.     Ezetimibe Tablets What is this medicine? EZETIMIBE (ez ET i mibe) blocks the absorption of cholesterol from the stomach. It can help lower blood cholesterol for patients who are at risk of getting heart disease or a stroke. It is only for patients whose cholesterol level is not controlled by diet. This medicine may be used for other purposes; ask your health care provider or pharmacist if you have questions. COMMON BRAND NAME(S): Zetia What should I tell my health care provider before I take this medicine? They need to know if you have any of these conditions:  liver disease  an unusual or allergic reaction to ezetimibe, medicines, foods, dyes, or preservatives  pregnant or trying to get pregnant  breast-feeding How should I use this medicine? Take this medicine by mouth with a glass of water. Follow the directions on the prescription label. This medicine can be taken with or without food. Take your doses at regular intervals. Do not take your medicine more often than directed. Talk to your pediatrician regarding the use  of this medicine in children. Special care may be needed. Overdosage: If you think you have taken too much of this medicine contact a poison control center or emergency room at once. NOTE: This medicine is only for you. Do not share this medicine with others. What if I miss a dose? If you miss a dose, take it as soon as you can. If it is almost time for your next dose, take only that dose. Do not take double or extra doses. What may interact with this medicine? Do not take this medicine with any of the following medications:  fenofibrate  gemfibrozil This medicine may also interact with the following medications:  antacids  cyclosporine  herbal medicines like red yeast rice  other medicines to lower cholesterol or triglycerides This list may not describe all possible interactions. Give your health care provider a list of all the medicines, herbs, non-prescription drugs, or dietary supplements you use. Also tell them if you smoke, drink alcohol, or use illegal drugs. Some items may interact with your medicine. What should I watch for while using this medicine? Visit your doctor or health care professional for regular checks on your progress. You will need to have your cholesterol levels checked. If you are also taking some other cholesterol medicines, you will also need to have tests to make sure your liver is working properly. Tell your doctor or health care professional if you get any unexplained muscle pain, tenderness, or weakness, especially if you also have a fever and tiredness. You need to follow a low-cholesterol,  low-fat diet while you are taking this medicine. This will decrease your risk of getting heart and blood vessel disease. Exercising and avoiding alcohol and smoking can also help. Ask your doctor or dietician for advice. What side effects may I notice from receiving this medicine? Side effects that you should report to your doctor or health care professional as soon as  possible:  allergic reactions like skin rash, itching or hives, swelling of the face, lips, or tongue  dark yellow or brown urine  unusually weak or tired  yellowing of the skin or eyes Side effects that usually do not require medical attention (report to your doctor or health care professional if they continue or are bothersome):  diarrhea  dizziness  headache  stomach upset or pain This list may not describe all possible side effects. Call your doctor for medical advice about side effects. You may report side effects to FDA at 1-800-FDA-1088. Where should I keep my medicine? Keep out of the reach of children. Store at room temperature between 15 and 30 degrees C (59 and 86 degrees F). Protect from moisture. Keep container tightly closed. Throw away any unused medicine after the expiration date. NOTE: This sheet is a summary. It may not cover all possible information. If you have questions about this medicine, talk to your doctor, pharmacist, or health care provider.  2020 Elsevier/Gold Standard (2011-09-14 15:39:09)

## 2019-11-21 NOTE — Progress Notes (Signed)
[facility] [address] [place]. [place] [address] [phone]       HOSPITAL FOLLOW UP NOTE  [name] Date of Birth:  11/11/1940 Medical Record Number:  [id number]   Reason for Referral:  hospital stroke follow up    SUBJECTIVE:   CHIEF COMPLAINT:  Chief Complaint  Patient presents with  . Follow-up    tx rm  . Cerebrovascular Accident    Pt is having no new sx since the stroke    HPI:    [name] is a 79 y.o. female with history of hypertension, migraine headaches and obesity who presented to the emergency department on 10/27/2019 with confusion and difficulty finding words following a severe headache.   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  Recommended placement of loop recorder outpatient to rule out atrial fibrillation.  Recommended DAPT for 3 weeks and aspirin alone. Hx of HTN on amlodipine 2.5 mg daily and increase to 5 mg daily with long-term BP goal normotensive range.  LDL 135 and initiate atorvastatin 40 mg daily.  Other stroke risk factors include advanced age, obesity and migraines.  No prior stroke history.  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 Mild chronic small vessel ischemic disease.   CTA H&N - Negative for carotid or vertebral artery stenosis in the neck. Negative for intracranial large vessel occlusion.   Carotid Doppler - unremarkable  2D Echo - EF 60 - 65%. No cardiac source of emboli identified.   Recommend outpatient loop recorder placement to rule out A. fib.  Discussed with Dr. Johney Frame.  Sars Corona Virus 2 - negative  LDL - 135  HgbA1c - 6.2  VTE prophylaxis - Lovenox  No antithrombotic prior to admission, now on aspirin 81 mg daily and clopidogrel 75 mg daily.  Continue DAPT for 3 weeks and then aspirin  alone.  Patient counseled to be compliant with her antithrombotic medications  Ongoing aggressive stroke risk factor management  Therapy recommendations: No therapy needs  Disposition: Home  Today, 11/21/2019, Kerry Richardson is being seen for hospital follow-up  She recovered well without residual deficits She has returned back to prior activities without difficulty  She has been having difficulty with decreased energy but has been slowly improving Denies new stroke/TIA symptoms  Completed 3 weeks DAPT remains on aspirin alone without bleeding or bruising While on DAPT, concerns for orbital bruising therefore plavix discontinued 2 days early with resolution of bruising Initiate atorvastatin at hospital discharge but she refuses statin therapy due to family history of side effects Blood pressure today 148/68 - monitors at home and has been stable - reports this AM 119/62  Loop recorder placed by Dr. Johney Frame on 8/10 -first check will be completed on 9/9   No further concerns at this time    ROS:   14 system review of systems performed and negative with exception of fatigue  PMH:  Past Medical History:  Diagnosis Date  . Allergy    chronic  . Alpha galactosidase deficiency   . Arthritis   . Back pain   . Bulging discs   . Bursitis   . DDD (degenerative disc disease)   . Dyspnea    with exertion  . GERD (gastroesophageal reflux disease)    mild  . Glaucoma   . H/O removal of cyst  finger (uncertain of which finger)  . Migraine headache   . Neck pain   . PONV (postoperative nausea and vomiting)   . Reflux   . Seasonal allergies     PSH:  Past Surgical History:  Procedure Laterality Date  . ABDOMINAL HYSTERECTOMY    . CARPAL TUNNEL RELEASE     bilateral  . CATARACT EXTRACTION W/PHACO Left 10/01/2014   Procedure: CATARACT EXTRACTION PHACO AND INTRAOCULAR LENS PLACEMENT LEFT EYE;  Surgeon: Gemma PayorKerry Hunt, MD;  Location: [facility];  Service: Ophthalmology;  Laterality: Left;   CDE:8.15  . CATARACT EXTRACTION W/PHACO Right 08/02/2017   Procedure: CATARACT EXTRACTION WITH PHACOEMULSIFICATION  AND INTRAOCULAR LENS PLACEMENT AND PLACEMENT OF iSTENT GLAUCOMA DEVICE RIGHT EYE;  Surgeon: Gemma PayorHunt, Kerry, MD;  Location: [facility];  Service: Ophthalmology;  Laterality: Right;  CDE: 7.77  . CERVICAL FUSION    . CHOLECYSTECTOMY N/A 05/15/2013   Procedure: LAPAROSCOPIC CHOLECYSTECTOMY WITH INTRAOPERATIVE CHOLANGIOGRAM;  Surgeon: Dalia HeadingMark A Jenkins, MD;  Location: [facility];  Service: General;  Laterality: N/A;  . CHONDROPLASTY  03/14/2012   Procedure: CHONDROPLASTY;  Surgeon: Vickki HearingStanley E Harrison, MD;  Location: [facility];  Service: Orthopedics;  Laterality: Left;  medial condyle  . CHONDROPLASTY Right 12/23/2012   Procedure: KNEE ARTHROSCOPY WITH CHONDROPLASTY WITH LIMITED DEBRIDMENT;  Surgeon: Vickki HearingStanley E Harrison, MD;  Location: [facility];  Service: Orthopedics;  Laterality: Right;  . FOOT SURGERY Bilateral    morton's neuroma  . implantable loop recorder placement  10/31/2019    Medtronic Reveal Linq model LNQ 11 (SN [id number]) implantable loop recorder implanted by Dr Johney FrameAllred for cryptogenic stroke  . KNEE ARTHROSCOPY WITH MEDIAL MENISECTOMY  03/14/2012   Procedure: KNEE ARTHROSCOPY WITH MEDIAL MENISECTOMY;  Surgeon: Vickki HearingStanley E Harrison, MD;  Location: [facility];  Service: Orthopedics;  Laterality: Left;    Social History:  Social History   Socioeconomic History  . Marital status: Widowed    Spouse name: Not on file  . Number of children: Not on file  . Years of education: 12+  . Highest education level: Not on file  Occupational History  . Occupation: realtor  Tobacco Use  . Smoking status: Never Smoker  . Smokeless tobacco: Never Used  Vaping Use  . Vaping Use: Never used  Substance and Sexual Activity  . Alcohol use: No  . Drug use: No  . Sexual activity: Never  Other Topics Concern  . Not on file  Social History Narrative  . Not on file   Social Determinants of Health    Financial Resource Strain:   . Difficulty of Paying Living Expenses: Not on file  Food Insecurity:   . Worried About Programme researcher, broadcasting/film/videounning Out of Food in the Last Year: Not on file  . Ran Out of Food in the Last Year: Not on file  Transportation Needs:   . Lack of Transportation (Medical): Not on file  . Lack of Transportation (Non-Medical): Not on file  Physical Activity:   . Days of Exercise per Week: Not on file  . Minutes of Exercise per Session: Not on file  Stress:   . Feeling of Stress : Not on file  Social Connections:   . Frequency of Communication with Friends and Family: Not on file  . Frequency of Social Gatherings with Friends and Family: Not on file  . Attends Religious Services: Not on file  . Active Member of Clubs or Organizations: Not on file  . Attends BankerClub or Organization Meetings: Not on file  . Marital Status: Not  on file  Intimate Partner Violence:   . Fear of Current or Ex-Partner: Not on file  . Emotionally Abused: Not on file  . Physically Abused: Not on file  . Sexually Abused: Not on file    Family History:  Family History  Problem Relation Age of Onset  . Hypertension Mother        borderline    Medications:   Current Outpatient Medications on File Prior to Visit  Medication Sig Dispense Refill  . amLODipine (NORVASC) 5 MG tablet Take 1 tablet (5 mg total) by mouth daily. 30 tablet 3  . aspirin EC 81 MG EC tablet Take 1 tablet (81 mg total) by mouth daily. Swallow whole. 30 tablet 11  . Cholecalciferol (VITAMIN D) 125 MCG (5000 UT) CAPS Take 5,000 Units by mouth daily.     Marland Kitchen ketoconazole (NIZORAL) 2 % cream APPLY TO AFFECTED AREA TWICE DAILY. (Patient taking differently: Apply 1 application topically daily. For prevention of abdominal rash) 60 g 5  . NIACINAMIDE PO Take 1 tablet by mouth daily.    Marland Kitchen OVER THE COUNTER MEDICATION Take 2 capsules by mouth daily. Lion's Mane Mushroom    . triamcinolone cream (KENALOG) 0.1 % Apply 1 application topically 2  (two) times daily as needed (itching).     . Zinc 50 MG TABS Take 50 mg by mouth daily.     No current facility-administered medications on file prior to visit.    Allergies:   Allergies  Allergen Reactions  . Codeine Nausea Only      OBJECTIVE:  Physical Exam  Vitals:   11/21/19 1348 11/21/19 1350  BP: (!) 160/59 (!) 148/68  Pulse: 83 74  Weight: 206 lb (93.4 kg)   Height: 5' (1.524 m)    Body mass index is 40.23 kg/m. No exam data present  Depression screen [facility] 2/9 05/03/2019  Decreased Interest 0  Down, Depressed, Hopeless 0  PHQ - 2 Score 0     General: well developed, well nourished,  pleasant elderly Caucasian female, seated, in no evident distress Head: head normocephalic and atraumatic.   Neck: supple with no carotid or supraclavicular bruits Cardiovascular: regular rate and rhythm, no murmurs Musculoskeletal: no deformity Skin:  no rash/petichiae Vascular:  Normal pulses all extremities   Neurologic Exam Mental Status: Awake and fully alert.   Fluent speech and language.  Oriented to place and time. Recent and remote memory intact. Attention span, concentration and fund of knowledge appropriate. Mood and affect appropriate.  Cranial Nerves: Fundoscopic exam reveals sharp disc margins. Pupils equal, briskly reactive to light. Extraocular movements full without nystagmus. Visual fields full to confrontation. Hearing intact. Facial sensation intact. Face, tongue, palate moves normally and symmetrically.  Motor: Normal bulk and tone. Normal strength in all tested extremity muscles. Sensory.: intact to touch , pinprick , position and vibratory sensation.  Coordination: Rapid alternating movements normal in all extremities. Finger-to-nose and heel-to-shin performed accurately bilaterally. Gait and Station: Arises from chair without difficulty. Stance is normal. Gait demonstrates normal stride length and balance Reflexes: 1+ and symmetric. Toes downgoing.      NIHSS  0 Modified Rankin  1      ASSESSMENT: Kerry Richardson is a 79 y.o. year old female presented with confusion and difficulty finding words following a severe headache on 10/27/2019 with stroke work-up revealing scattered early subacute left MCA territory infarct, embolic secondary to unknown source s/p loop recorder placement on 8/10. Vascular risk factors include HTN, HLD, migraines and  obesity.      PLAN:  1. Left MCA stroke, cryptogenic:  a. Recovered well without residual deficits.   b. Loop recorder will continue to be monitored for possible atrial fibrillation.   c. Continue aspirin 81 mg daily  and atorvastatin for secondary stroke prevention.  d. Close PCP follow up for aggressive stroke risk factor management  2. HTN:  a. BP goal <130/90.   b. Stable.   c. Continue f/u with PCP 3. HLD:  a. LDL goal <70. Recent LDL 135.   b. Declines statin therapy despite further discussion today and prior discussions with PCP. c. Discussed use of Zetia which she will further consider.  Information provided for additional review d. Educated on indication for statin therapy which she verbalized understanding but continues to decline    Follow up in 4 or call earlier if needed   I spent 45 minutes of face-to-face and non-face-to-face time with patient.  This included previsit chart review, lab review, study review, order entry, electronic health record documentation, patient education regarding recent stroke,  importance of managing stroke risk factors and indication behind statin therapy, monitoring of loop recorder and answered all questions to patient satisfaction     Ihor Austin, AGNP-BC  [facility] [address] [place], [place] [address]  Phone [phone] Fax [phone] Note: This document was prepared with digital dictation and possible smart phrase technology. Any transcriptional errors that result from this process are  unintentional.

## 2019-11-21 NOTE — Progress Notes (Signed)
I agree with the above plan 

## 2019-11-30 ENCOUNTER — Ambulatory Visit (INDEPENDENT_AMBULATORY_CARE_PROVIDER_SITE_OTHER): Payer: Medicare Other | Admitting: *Deleted

## 2019-11-30 DIAGNOSIS — I639 Cerebral infarction, unspecified: Secondary | ICD-10-CM | POA: Diagnosis not present

## 2019-12-04 LAB — CUP PACEART REMOTE DEVICE CHECK
Date Time Interrogation Session: 20210912140758
Implantable Pulse Generator Implant Date: 20210810

## 2019-12-05 NOTE — Progress Notes (Signed)
[facility] Summary Report / Loop Recorder 

## 2020-01-02 ENCOUNTER — Ambulatory Visit (INDEPENDENT_AMBULATORY_CARE_PROVIDER_SITE_OTHER): Payer: Medicare Other

## 2020-01-02 DIAGNOSIS — I639 Cerebral infarction, unspecified: Secondary | ICD-10-CM | POA: Diagnosis not present

## 2020-01-06 LAB — CUP PACEART REMOTE DEVICE CHECK
Date Time Interrogation Session: 20211015141320
Implantable Pulse Generator Implant Date: 20210810

## 2020-01-08 NOTE — Progress Notes (Signed)
[facility] Summary Report / Loop Recorder 

## 2020-01-18 DIAGNOSIS — Z961 Presence of intraocular lens: Secondary | ICD-10-CM | POA: Diagnosis not present

## 2020-01-18 DIAGNOSIS — H401112 Primary open-angle glaucoma, right eye, moderate stage: Secondary | ICD-10-CM | POA: Diagnosis not present

## 2020-01-18 DIAGNOSIS — H01002 Unspecified blepharitis right lower eyelid: Secondary | ICD-10-CM | POA: Diagnosis not present

## 2020-01-18 DIAGNOSIS — H01001 Unspecified blepharitis right upper eyelid: Secondary | ICD-10-CM | POA: Diagnosis not present

## 2020-02-05 ENCOUNTER — Ambulatory Visit (INDEPENDENT_AMBULATORY_CARE_PROVIDER_SITE_OTHER): Payer: Medicare Other

## 2020-02-05 DIAGNOSIS — I639 Cerebral infarction, unspecified: Secondary | ICD-10-CM | POA: Diagnosis not present

## 2020-02-05 LAB — CUP PACEART REMOTE DEVICE CHECK
Date Time Interrogation Session: 20211114234401
Implantable Pulse Generator Implant Date: 20210810

## 2020-02-07 NOTE — Progress Notes (Signed)
[facility] Summary Report / Loop Recorder 

## 2020-02-29 DIAGNOSIS — H401112 Primary open-angle glaucoma, right eye, moderate stage: Secondary | ICD-10-CM | POA: Diagnosis not present

## 2020-03-01 ENCOUNTER — Telehealth: Payer: Self-pay | Admitting: Family Medicine

## 2020-03-01 MED ORDER — AMLODIPINE BESYLATE 5 MG PO TABS: 5.0000 mg | ORAL_TABLET | Freq: Every day | ORAL | 2 refills | Status: DC

## 2020-03-01 NOTE — Addendum Note (Signed)
Addended by: Annalee Genta on: 03/01/2020 03:27 PM   Modules accepted: Orders

## 2020-03-01 NOTE — Telephone Encounter (Signed)
Pt requesting refill on Amlodipine 5 mg-take one daily. Amlodipine was prescribed by provider in hospital back in August. Pt states she is doing well on meds and checking BP at least every 3 days.  [facility].  Please advise. Thank you

## 2020-03-01 NOTE — Telephone Encounter (Signed)
Patient notified

## 2020-03-11 ENCOUNTER — Ambulatory Visit (INDEPENDENT_AMBULATORY_CARE_PROVIDER_SITE_OTHER): Payer: Medicare Other

## 2020-03-11 DIAGNOSIS — I639 Cerebral infarction, unspecified: Secondary | ICD-10-CM | POA: Diagnosis not present

## 2020-03-11 LAB — CUP PACEART REMOTE DEVICE CHECK
Date Time Interrogation Session: 20211217234031
Implantable Pulse Generator Implant Date: 20210810

## 2020-03-25 DIAGNOSIS — H401132 Primary open-angle glaucoma, bilateral, moderate stage: Secondary | ICD-10-CM | POA: Diagnosis not present

## 2020-03-25 DIAGNOSIS — H04223 Epiphora due to insufficient drainage, bilateral lacrimal glands: Secondary | ICD-10-CM | POA: Diagnosis not present

## 2020-03-25 DIAGNOSIS — H04221 Epiphora due to insufficient drainage, right lacrimal gland: Secondary | ICD-10-CM | POA: Diagnosis not present

## 2020-03-25 NOTE — Progress Notes (Signed)
[facility] Summary Report / Loop Recorder 

## 2020-03-27 ENCOUNTER — Ambulatory Visit: Payer: Medicare Other | Admitting: Adult Health

## 2020-04-11 LAB — CUP PACEART REMOTE DEVICE CHECK
Date Time Interrogation Session: 20220120005543
Implantable Pulse Generator Implant Date: 20210810

## 2020-04-15 ENCOUNTER — Ambulatory Visit (INDEPENDENT_AMBULATORY_CARE_PROVIDER_SITE_OTHER): Payer: Medicare Other

## 2020-04-15 DIAGNOSIS — I639 Cerebral infarction, unspecified: Secondary | ICD-10-CM | POA: Diagnosis not present

## 2020-04-25 NOTE — Progress Notes (Signed)
[facility] Summary Report / Loop Recorder 

## 2020-05-13 ENCOUNTER — Ambulatory Visit: Payer: Medicare Other | Admitting: Family Medicine

## 2020-05-15 ENCOUNTER — Ambulatory Visit: Payer: Medicare Other | Admitting: Family Medicine

## 2020-05-20 ENCOUNTER — Ambulatory Visit (INDEPENDENT_AMBULATORY_CARE_PROVIDER_SITE_OTHER): Payer: Medicare Other | Admitting: Family Medicine

## 2020-05-20 ENCOUNTER — Encounter: Payer: Self-pay | Admitting: Family Medicine

## 2020-05-20 ENCOUNTER — Other Ambulatory Visit: Payer: Self-pay

## 2020-05-20 VITALS — BP 132/68 | HR 51 | Temp 96.7°F | Ht 60.0 in | Wt 191.8 lb

## 2020-05-20 DIAGNOSIS — I10 Essential (primary) hypertension: Secondary | ICD-10-CM

## 2020-05-20 DIAGNOSIS — E7800 Pure hypercholesterolemia, unspecified: Secondary | ICD-10-CM

## 2020-05-20 DIAGNOSIS — B354 Tinea corporis: Secondary | ICD-10-CM | POA: Diagnosis not present

## 2020-05-20 DIAGNOSIS — R7303 Prediabetes: Secondary | ICD-10-CM | POA: Diagnosis not present

## 2020-05-20 DIAGNOSIS — M5126 Other intervertebral disc displacement, lumbar region: Secondary | ICD-10-CM

## 2020-05-20 MED ORDER — AMLODIPINE BESYLATE 5 MG PO TABS: 5.0000 mg | ORAL_TABLET | Freq: Every day | ORAL | 5 refills | Status: DC

## 2020-05-20 MED ORDER — TRAMADOL HCL 50 MG PO TABS: 50.0000 mg | ORAL_TABLET | Freq: Three times a day (TID) | ORAL | 0 refills | Status: DC | PRN

## 2020-05-20 NOTE — Progress Notes (Signed)
Patient ID: Kerry Richardson, female    DOB: [birth year], 80 y.o.   MRN: [id number]   Chief Complaint  Patient presents with  . Hypertension   Subjective:    HPI   Kerry Richardson here for follow up on HTN. Blood pressure stays around [phone] systolic. No headaches,dizziness, vision issues.  Kerry Richardson states back pain has become worse. Right side lower back pain. After sitting for a while, takes a while to get in motion. Kerry Richardson has disability place card form.   Has tried heat/ice in past.  Rt lower shin rash, 4 inch area.  Has derm not seen them again in past year.  Since had a stroke.  Using ketoconazole on abd.   Kerry Richardson having some rt low back pain. Feeling needing to lean over on a cart due to pain. No radiation of pain.  Constant pain. Worse with walking or standing.  Fine with sitting. meds- tylenol prn.  Used to see back specialist in past. In 2018, had MRI.  With rt foraminal narrowing. Had injections in the past.  Felt that didn't help as well.  Used to take meloxicam. In past tolerated norco.   No dysuria, hematauria.  No fall or injury to spine.   Medical History Kerry Richardson has a past medical history of Allergy, Alpha galactosidase deficiency, Arthritis, Back pain, Bulging discs, Bursitis, DDD (degenerative disc disease), Dyspnea, GERD (gastroesophageal reflux disease), Glaucoma, H/O removal of cyst, Migraine headache, Neck pain, PONV (postoperative nausea and vomiting), Reflux, and Seasonal allergies.   Outpatient Encounter Medications as of 05/20/2020  Medication Sig  . aspirin EC 81 MG EC tablet Take 1 tablet (81 mg total) by mouth daily. Swallow whole.  . Cholecalciferol (VITAMIN D) 125 MCG (5000 UT) CAPS Take 5,000 Units by mouth daily.   Marland Kitchen ketoconazole (NIZORAL) 2 % cream APPLY TO AFFECTED AREA TWICE DAILY. (Patient taking differently: Apply 1 application topically daily. For prevention of abdominal rash)  . NIACINAMIDE PO Take 1 tablet by mouth daily.  Marland Kitchen OVER THE COUNTER  MEDICATION Take 2 capsules by mouth daily. Lion's Mane Mushroom  . triamcinolone cream (KENALOG) 0.1 % Apply 1 application topically 2 (two) times daily as needed (itching).   . Zinc 50 MG TABS Take 50 mg by mouth daily.  . [DISCONTINUED] amLODipine (NORVASC) 5 MG tablet Take 1 tablet (5 mg total) by mouth daily.  . [DISCONTINUED] traMADol (ULTRAM) 50 MG tablet Take 1 tablet (50 mg total) by mouth every 8 (eight) hours as needed.  Marland Kitchen amLODipine (NORVASC) 5 MG tablet Take 1 tablet (5 mg total) by mouth daily.   No facility-administered encounter medications on file as of 05/20/2020.     Review of Systems  Constitutional: Negative for chills and fever.  HENT: Negative for congestion, rhinorrhea and sore throat.   Respiratory: Negative for cough, shortness of breath and wheezing.   Cardiovascular: Negative for chest pain and leg swelling.  Gastrointestinal: Negative for abdominal pain, diarrhea, nausea and vomiting.  Genitourinary: Negative for dysuria and frequency.  Musculoskeletal: Positive for back pain. Negative for arthralgias.  Skin: Positive for rash (rt lower leg).  Neurological: Negative for dizziness, weakness and headaches.     Vitals BP 132/68   Pulse (!) 51   Temp (!) 96.7 F (35.9 C)   Ht 5' (1.524 m)   Wt 191 lb 12.8 oz (87 kg)   SpO2 92%   BMI 37.46 kg/m   Objective:   Physical Exam Vitals and nursing note reviewed.  Constitutional:  Appearance: Normal appearance.  HENT:     Head: Normocephalic and atraumatic.     Nose: Nose normal.     Mouth/Throat:     Mouth: Mucous membranes are moist.     Pharynx: Oropharynx is clear.  Eyes:     Extraocular Movements: Extraocular movements intact.     Conjunctiva/sclera: Conjunctivae normal.     Pupils: Pupils are equal, round, and reactive to light.  Cardiovascular:     Rate and Rhythm: Regular rhythm. Bradycardia present.     Pulses: Normal pulses.     Heart sounds: Normal heart sounds.  Pulmonary:      Effort: Pulmonary effort is normal.     Breath sounds: Normal breath sounds. No wheezing, rhonchi or rales.  Musculoskeletal:        General: Normal range of motion.     Right lower leg: No edema.     Left lower leg: No edema.  Skin:    General: Skin is warm and dry.     Findings: Rash present. No lesion.     Comments: +erythematous macule about 2 inch diameter on rt anterior shin. No blisters or drainage.   Neurological:     General: No focal deficit present.     Mental Status: Kerry Richardson is alert and oriented to person, place, and time.  Psychiatric:        Mood and Affect: Mood normal.        Behavior: Behavior normal.      Assessment and Plan   1. Primary hypertension - CBC - CMP14+EGFR - amLODipine (NORVASC) 5 MG tablet; Take 1 tablet (5 mg total) by mouth daily.  Dispense: 30 tablet; Refill: 5  2. Pure hypercholesterolemia - Lipid panel  3. Prediabetes - Hemoglobin A1c  4. Tinea corporis  5. DEGENERATIVE DISC DISEASE, LUMBOSACRAL SPINE W/RADICULOPATHY   Tinea corporis- ketoconazole for 2 wks. Call if not improving.  Back pain- Not able to take nsaids, just take tylenol.  Tried a course of tramadol.  Kerry Richardson felt dizzy, called in norco small amt prn.  htn- stable, cont meds,   HLD -   Elevated, needing to eat low cholesterol and inc in exercising.  Prediabetes- stable. Cont to eat low carb diet.  Return in about 6 months (around 11/17/2020) for f/u htn, labs.

## 2020-05-21 LAB — HEMOGLOBIN A1C
Est. average glucose Bld gHb Est-mCnc: 128 mg/dL
Hgb A1c MFr Bld: 6.1 % — ABNORMAL HIGH (ref 4.8–5.6)

## 2020-05-21 LAB — CMP14+EGFR
ALT: 15 IU/L (ref 0–32)
AST: 16 IU/L (ref 0–40)
Albumin/Globulin Ratio: 1.4 (ref 1.2–2.2)
Albumin: 4.4 g/dL (ref 3.7–4.7)
Alkaline Phosphatase: 86 IU/L (ref 44–121)
BUN/Creatinine Ratio: 22 (ref 12–28)
BUN: 16 mg/dL (ref 8–27)
Bilirubin Total: 0.3 mg/dL (ref 0.0–1.2)
CO2: 23 mmol/L (ref 20–29)
Calcium: 10 mg/dL (ref 8.7–10.3)
Chloride: 101 mmol/L (ref 96–106)
Creatinine, Ser: 0.72 mg/dL (ref 0.57–1.00)
Globulin, Total: 3.1 g/dL (ref 1.5–4.5)
Glucose: 89 mg/dL (ref 65–99)
Potassium: 5.2 mmol/L (ref 3.5–5.2)
Sodium: 141 mmol/L (ref 134–144)
Total Protein: 7.5 g/dL (ref 6.0–8.5)
eGFR: 84 mL/min/{1.73_m2} (ref >59)

## 2020-05-21 LAB — LIPID PANEL
Chol/HDL Ratio: 3.3 ratio (ref 0.0–4.4)
Cholesterol, Total: 243 mg/dL — ABNORMAL HIGH (ref 100–199)
HDL: 74 mg/dL (ref >39)
LDL Chol Calc (NIH): 145 mg/dL — ABNORMAL HIGH (ref 0–99)
Triglycerides: 136 mg/dL (ref 0–149)
VLDL Cholesterol Cal: 24 mg/dL (ref 5–40)

## 2020-05-21 LAB — CBC
Hematocrit: 43.2 % (ref 34.0–46.6)
Hemoglobin: 14.5 g/dL (ref 11.1–15.9)
MCH: 29.8 pg (ref 26.6–33.0)
MCHC: 33.6 g/dL (ref 31.5–35.7)
MCV: 89 fL (ref 79–97)
Platelets: 340 10*3/uL (ref 150–450)
RBC: 4.86 x10E6/uL (ref 3.77–5.28)
RDW: 13.4 % (ref 11.7–15.4)
WBC: 9.5 10*3/uL (ref 3.4–10.8)

## 2020-05-28 ENCOUNTER — Telehealth: Payer: Self-pay | Admitting: *Deleted

## 2020-05-28 MED ORDER — HYDROCODONE-ACETAMINOPHEN 5-325 MG PO TABS: ORAL_TABLET | ORAL | 0 refills | Status: DC

## 2020-05-28 NOTE — Telephone Encounter (Signed)
Pt states she was just in for back pain and prescribed Tramadol but not able to take. She only took two doses and not able to tolerate it. Caused dizziness and nausea, head felt heavy and was not able to function. Would like something else for back pain.   [place] [facility].

## 2020-05-28 NOTE — Telephone Encounter (Signed)
Pt.notified

## 2020-06-05 ENCOUNTER — Other Ambulatory Visit: Payer: Self-pay | Admitting: Family Medicine

## 2020-06-05 DIAGNOSIS — I10 Essential (primary) hypertension: Secondary | ICD-10-CM

## 2020-06-05 MED ORDER — AMLODIPINE BESYLATE 5 MG PO TABS: 5.0000 mg | ORAL_TABLET | Freq: Every day | ORAL | 1 refills | Status: DC

## 2020-06-05 NOTE — Telephone Encounter (Signed)
Patient was seen 2/28 and was told she could get 3 month supply of her amlodipine 5 mg called in.She states not taking hydrocodone because it was too strong so she didn't pick up last prescription. [facility]

## 2020-06-16 LAB — CUP PACEART REMOTE DEVICE CHECK
Date Time Interrogation Session: 20220327020342
Implantable Pulse Generator Implant Date: 20210810

## 2020-06-17 ENCOUNTER — Ambulatory Visit (INDEPENDENT_AMBULATORY_CARE_PROVIDER_SITE_OTHER): Payer: Medicare Other

## 2020-06-17 DIAGNOSIS — I639 Cerebral infarction, unspecified: Secondary | ICD-10-CM | POA: Diagnosis not present

## 2020-06-24 DIAGNOSIS — H01001 Unspecified blepharitis right upper eyelid: Secondary | ICD-10-CM | POA: Diagnosis not present

## 2020-06-24 DIAGNOSIS — Z961 Presence of intraocular lens: Secondary | ICD-10-CM | POA: Diagnosis not present

## 2020-06-24 DIAGNOSIS — H401132 Primary open-angle glaucoma, bilateral, moderate stage: Secondary | ICD-10-CM | POA: Diagnosis not present

## 2020-06-24 DIAGNOSIS — H04221 Epiphora due to insufficient drainage, right lacrimal gland: Secondary | ICD-10-CM | POA: Diagnosis not present

## 2020-07-01 NOTE — Progress Notes (Signed)
[facility] Summary Report / Loop Recorder 

## 2020-07-08 NOTE — Progress Notes (Signed)
Subjective:   Kerry Richardson is a 80 y.o. female who presents for Medicare Annual (Subsequent) preventive examination.  Review of Systems    N/A  Cardiac Risk Factors include: advanced age (>56men, >41 women);hypertension;dyslipidemia     Objective:    Today's Vitals   07/09/20 1342 07/09/20 1349  BP: 128/68   Pulse: 73   Temp: 97.6 F (36.4 C)   TempSrc: Temporal   SpO2: 95%   Weight: 192 lb 4 oz (87.2 kg)   Height: 5' (1.524 m)   PainSc:  7    Body mass index is 37.55 kg/m.  Advanced Directives 07/09/2020 10/27/2019 08/02/2017 07/26/2017 10/01/2014 09/27/2014 12/14/2013  Does Patient Have a Medical Advance Directive? Yes Yes Yes Yes Yes Yes Yes  Type of Estate agent of [place];Living will Healthcare Power of [place];Living will Healthcare Power of [employer] Power of [employer] Power of [place];Living will Healthcare Power of [place];Living will Living will  Does patient want to make changes to medical advance directive? No - Patient declined No - Patient declined - - - - -  Copy of Healthcare Power of Attorney in Chart? Yes - validated most recent copy scanned in chart (See row information) - Yes Yes Yes Yes No - copy requested  Pre-existing out of facility DNR order (yellow form or pink MOST form) - - - - - - -    Current Medications (verified) Outpatient Encounter Medications as of 07/09/2020  Medication Sig  . amLODipine (NORVASC) 5 MG tablet Take 1 tablet (5 mg total) by mouth daily.  Marland Kitchen aspirin EC 81 MG EC tablet Take 1 tablet (81 mg total) by mouth daily. Swallow whole.  . Cholecalciferol (VITAMIN D) 125 MCG (5000 UT) CAPS Take 5,000 Units by mouth daily.   Marland Kitchen ketoconazole (NIZORAL) 2 % cream APPLY TO AFFECTED AREA TWICE DAILY. (Patient taking differently: Apply 1 application topically daily. For prevention of abdominal rash)  . NIACINAMIDE PO Take 1 tablet by mouth daily.  Marland Kitchen OVER THE COUNTER MEDICATION Take 2 capsules by mouth daily.  Lion's Mane Mushroom  . triamcinolone cream (KENALOG) 0.1 % Apply 1 application topically 2 (two) times daily as needed (itching).   . Zinc 50 MG TABS Take 50 mg by mouth daily.  . [DISCONTINUED] HYDROcodone-acetaminophen (NORCO) 5-325 MG tablet Take 1/2-1 tab p.o. q 8hrs prn pain. (Patient not taking: Reported on 07/09/2020)   No facility-administered encounter medications on file as of 07/09/2020.    Allergies (verified) Codeine   History: Past Medical History:  Diagnosis Date  . Allergy    chronic  . Alpha galactosidase deficiency   . Arthritis   . Back pain   . Bulging discs   . Bursitis   . DDD (degenerative disc disease)   . Dyspnea    with exertion  . GERD (gastroesophageal reflux disease)    mild  . Glaucoma   . H/O removal of cyst    finger (uncertain of which finger)  . Migraine headache   . Neck pain   . PONV (postoperative nausea and vomiting)   . Reflux   . Seasonal allergies    Past Surgical History:  Procedure Laterality Date  . ABDOMINAL HYSTERECTOMY    . CARPAL TUNNEL RELEASE     bilateral  . CATARACT EXTRACTION W/PHACO Left 10/01/2014   Procedure: CATARACT EXTRACTION PHACO AND INTRAOCULAR LENS PLACEMENT LEFT EYE;  Surgeon: Gemma Payor, MD;  Location: [facility];  Service: Ophthalmology;  Laterality: Left;  CDE:8.15  .  CATARACT EXTRACTION W/PHACO Right 08/02/2017   Procedure: CATARACT EXTRACTION WITH PHACOEMULSIFICATION  AND INTRAOCULAR LENS PLACEMENT AND PLACEMENT OF iSTENT GLAUCOMA DEVICE RIGHT EYE;  Surgeon: Gemma Payor, MD;  Location: [facility];  Service: Ophthalmology;  Laterality: Right;  CDE: 7.77  . CERVICAL FUSION    . CHOLECYSTECTOMY N/A 05/15/2013   Procedure: LAPAROSCOPIC CHOLECYSTECTOMY WITH INTRAOPERATIVE CHOLANGIOGRAM;  Surgeon: Dalia Heading, MD;  Location: [facility];  Service: General;  Laterality: N/A;  . CHONDROPLASTY  03/14/2012   Procedure: CHONDROPLASTY;  Surgeon: Vickki Hearing, MD;  Location: [facility];  Service: Orthopedics;  Laterality:  Left;  medial condyle  . CHONDROPLASTY Right 12/23/2012   Procedure: KNEE ARTHROSCOPY WITH CHONDROPLASTY WITH LIMITED DEBRIDMENT;  Surgeon: Vickki Hearing, MD;  Location: [facility];  Service: Orthopedics;  Laterality: Right;  . FOOT SURGERY Bilateral    morton's neuroma  . 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 KNEE ARTHROSCOPY WITH MEDIAL MENISECTOMY  03/14/2012   Procedure: KNEE ARTHROSCOPY WITH MEDIAL MENISECTOMY;  Surgeon: Vickki Hearing, MD;  Location: [facility];  Service: Orthopedics;  Laterality: Left;   Family History  Problem Relation Age of Onset  . Hypertension Mother        borderline   Social History   Socioeconomic History  . Marital status: Widowed    Spouse name: Not on file  . Number of children: Not on file  . Years of education: 12+  . Highest education level: Not on file  Occupational History  . Occupation: realtor  Tobacco Use  . Smoking status: Never Smoker  . Smokeless tobacco: Never Used  Vaping Use  . Vaping Use: Never used  Substance and Sexual Activity  . Alcohol use: No  . Drug use: No  . Sexual activity: Never  Other Topics Concern  . Not on file  Social History Narrative  . Not on file   Social Determinants of Health   Financial Resource Strain: Not on file  Food Insecurity: Not on file  Transportation Needs: Not on file  Physical Activity: Not on file  Stress: Not on file  Social Connections: Moderately Isolated  . Frequency of Communication with Friends and Family: More than three times a week  . Frequency of Social Gatherings with Friends and Family: Twice a week  . Attends Religious Services: More than 4 times per year  . Active Member of Clubs or Organizations: No  . Attends Banker Meetings: Never  . Marital Status: Widowed    Tobacco Counseling Counseling given: Not  Answered   Clinical Intake:  Pre-visit preparation completed: Yes  Pain : 0-10 Pain Score: 7  Pain Type: Chronic pain Pain Location: Bladder Pain Orientation: Lower Pain Descriptors / Indicators: Constant Pain Onset: More than a month ago Pain Frequency: Intermittent Pain Relieving Factors: sitting, laying down  Pain Relieving Factors: sitting, laying down  Nutritional Risks: Nausea/ vomitting/ diarrhea (Diarrhea) Diabetes: No  How often do you need to have someone help you when you read instructions, pamphlets, or other written materials from your doctor or pharmacy?: 1 - Never  Diabetic?No   Interpreter Needed?: No  Information entered by :: SCrews,LPN   Activities of Daily Living In your present state of health, do you have any difficulty performing the following activities: 07/09/2020 10/28/2019  Hearing? Y -  Vision? N -  Difficulty concentrating or making decisions? N -  Walking or climbing stairs? N -  Dressing or bathing? N -  Doing errands, shopping? N N  Preparing Food and eating ? N -  Using the Toilet? N -  In the past six months, have you accidently leaked urine? Y -  Comment has occassional leakage with urgency at bedtime -  Do you have problems with loss of bowel control? N -  Managing your Medications? N -  Managing your Finances? N -  Housekeeping or managing your Housekeeping? N -  Some recent data might be hidden    Patient Care Team: Annalee Genta, DO as PCP - General (Family Medicine)  Indicate any recent Medical Services you may have received from other than [facility] providers in the past year (date may be approximate).     Assessment:   This is a routine wellness examination for Gabrielly.  Hearing/Vision screen  Hearing Screening   125Hz  250Hz  500Hz  1000Hz  2000Hz  3000Hz  4000Hz  6000Hz  8000Hz   Right ear:           Left ear:           Vision Screening Comments: Has glaucoma, gets eye exams every 3 months with specialist. Sees regular  ophthalmologist once per year.   Dietary issues and exercise activities discussed: Current Exercise Habits: The patient does not participate in regular exercise at present, Exercise limited by: orthopedic condition(s)  Goals    . Exercise 3x per week (30 min per time)      Depression Screen PHQ 2/9 Scores 07/09/2020 05/20/2020 05/03/2019 03/24/2017 03/06/2015  PHQ - 2 Score 0 0 0 0 0    Fall Risk Fall Risk  07/09/2020 05/20/2020 05/03/2019 03/24/2017 03/06/2015  Falls in the past year? 0 0 0 No No  Number falls in past yr: 0 0 - - -  Injury with Fall? 0 0 - - -  Risk for fall due to : No Fall Risks - - - -  Follow up Falls evaluation completed;Falls prevention discussed Falls evaluation completed Falls evaluation completed - -    FALL RISK PREVENTION PERTAINING TO THE HOME:  Any stairs in or around the home? Yes  If so, are there any without handrails? No  Home free of loose throw rugs in walkways, pet beds, electrical cords, etc? Yes  Adequate lighting in your home to reduce risk of falls? Yes   ASSISTIVE DEVICES UTILIZED TO PREVENT FALLS:  Life alert? No  Use of a cane, walker or w/c? No  Grab bars in the bathroom? No  Shower chair or bench in shower? No  Elevated toilet seat or a handicapped toilet? No   TIMED UP AND GO:  Was the test performed? Yes .  Length of time to ambulate 10 feet: 5 sec.   Gait steady and fast without use of assistive device  Cognitive Function:   Normal cognitive status assessed by direct observation by this Nurse Health Advisor. No abnormalities found.        Immunizations Immunization History  Administered Date(s) Administered  . Influenza Whole 12/19/2007  . Influenza,inj,Quad PF,6+ Mos 12/18/2013, 12/17/2014, 03/09/2016  . Influenza-Unspecified 01/17/2013  . Pneumococcal Conjugate-13 12/17/2014  . Pneumococcal Polysaccharide-23 03/24/2011  . Td 07/05/1998, 07/16/2015  . Zoster 06/26/2008    TDAP status: Up to date  Flu Vaccine  status: Due, Education has been provided regarding the importance of this vaccine. Advised may receive this vaccine at local pharmacy or Health Dept. Aware to provide a copy of the vaccination record if obtained from  local pharmacy or Health Dept. Verbalized acceptance and understanding.  Pneumococcal vaccine status: Up to date  Covid-19 vaccine status: Declined, Education has been provided regarding the importance of this vaccine but patient still declined. Advised may receive this vaccine at local pharmacy or Health Dept.or vaccine clinic. Aware to provide a copy of the vaccination record if obtained from local pharmacy or Health Dept. Verbalized acceptance and understanding.  Qualifies for Shingles Vaccine? Yes   Zostavax completed Yes   Shingrix Completed?: No.    Education has been provided regarding the importance of this vaccine. Patient has been advised to call insurance company to determine out of pocket expense if they have not yet received this vaccine. Advised may also receive vaccine at local pharmacy or Health Dept. Verbalized acceptance and understanding.  Screening Tests Health Maintenance  Topic Date Due  . COVID-19 Vaccine (1) 07/25/2020 (Originally 08/09/1945)  . INFLUENZA VACCINE  10/21/2020  . TETANUS/TDAP  07/15/2025  . DEXA SCAN  Completed  . PNA vac Low Risk Adult  Completed  . HPV VACCINES  Aged Out    Health Maintenance  There are no preventive care reminders to display for this patient.  Colorectal cancer screening: No longer required.   Mammogram status: Ordered 07/09/2020. Pt provided with contact info and advised to call to schedule appt.   Bone Density Status: No longer required   Lung Cancer Screening: (Low Dose CT Chest recommended if Age 57-80 years, 30 pack-year currently smoking OR have quit w/in 15years.) does not qualify.   Lung Cancer Screening Referral: N/A   Additional Screening:  Hepatitis C Screening: does not qualify;   Vision  Screening: Recommended annual ophthalmology exams for early detection of glaucoma and other disorders of the eye. Is the patient up to date with their annual eye exam?  Yes  Who is the provider or what is the name of the office in which the patient attends annual eye exams? Dr. Charise Killianotter If pt is not established with a provider, would they like to be referred to a provider to establish care? No .   Dental Screening: Recommended annual dental exams for proper oral hygiene  Community Resource Referral / Chronic Care Management: CRR required this visit?  No   CCM required this visit?  No      Plan:     I have personally reviewed and noted the following in the patient's chart:   . Medical and social history . Use of alcohol, tobacco or illicit drugs  . Current medications and supplements . Functional ability and status . Nutritional status . Physical activity . Advanced directives . List of other physicians . Hospitalizations, surgeries, and ER visits in previous 12 months . Vitals . Screenings to include cognitive, depression, and falls . Referrals and appointments  In addition, I have reviewed and discussed with patient certain preventive protocols, quality metrics, and best practice recommendations. A written personalized care plan for preventive services as well as general preventive health recommendations were provided to patient.     Theodora BlowShannon Dorthula Bier, LPN   [id number]/91/47824/19/2022   Nurse Notes: None

## 2020-07-09 ENCOUNTER — Ambulatory Visit (INDEPENDENT_AMBULATORY_CARE_PROVIDER_SITE_OTHER): Payer: Medicare Other

## 2020-07-09 ENCOUNTER — Other Ambulatory Visit: Payer: Self-pay

## 2020-07-09 VITALS — BP 128/68 | HR 73 | Temp 97.6°F | Ht 60.0 in | Wt 192.2 lb

## 2020-07-09 DIAGNOSIS — Z1231 Encounter for screening mammogram for malignant neoplasm of breast: Secondary | ICD-10-CM | POA: Diagnosis not present

## 2020-07-09 DIAGNOSIS — Z0000 Encounter for general adult medical examination without abnormal findings: Secondary | ICD-10-CM

## 2020-07-09 NOTE — Patient Instructions (Addendum)
Kerry Richardson , Thank you for taking time to come for your Medicare Wellness Visit. I appreciate your ongoing commitment to your health goals. Please review the following plan we discussed and let me know if I can assist you in the future.   Screening recommendations/referrals: Colonoscopy: No longer required Mammogram: Currently due, orders placed this visit Bone Density: No longer required  Recommended yearly ophthalmology/optometry visit for glaucoma screening and checkup Recommended yearly dental visit for hygiene and checkup  Vaccinations: Influenza vaccine: Patient declined  Pneumococcal vaccine: Completed series Tdap vaccine: Up to date, next due 07/15/2025 Shingles vaccine: Currently due for Shingrix, if you would like to receive, we recommend that you do so at your local pharmacy.     Advanced directives: Copies on file   Conditions/risks identified: None   Next appointment: None    Preventive Care 65 Years and Older, Female Preventive care refers to lifestyle choices and visits with your health care provider that can promote health and wellness. What does preventive care include?  A yearly physical exam. This is also called an annual well check.  Dental exams once or twice a year.  Routine eye exams. Ask your health care provider how often you should have your eyes checked.  Personal lifestyle choices, including:  Daily care of your teeth and gums.  Regular physical activity.  Eating a healthy diet.  Avoiding tobacco and drug use.  Limiting alcohol use.  Practicing safe sex.  Taking low-dose aspirin every day.  Taking vitamin and mineral supplements as recommended by your health care provider. What happens during an annual well check? The services and screenings done by your health care provider during your annual well check will depend on your age, overall health, lifestyle risk factors, and family history of disease. Counseling  Your health care provider  may ask you questions about your:  Alcohol use.  Tobacco use.  Drug use.  Emotional well-being.  Home and relationship well-being.  Sexual activity.  Eating habits.  History of falls.  Memory and ability to understand (cognition).  Work and work Astronomer.  Reproductive health. Screening  You may have the following tests or measurements:  Height, weight, and BMI.  Blood pressure.  Lipid and cholesterol levels. These may be checked every 5 years, or more frequently if you are over 64 years old.  Skin check.  Lung cancer screening. You may have this screening every year starting at age 53 if you have a 30-pack-year history of smoking and currently smoke or have quit within the past 15 years.  Fecal occult blood test (FOBT) of the stool. You may have this test every year starting at age 109.  Flexible sigmoidoscopy or colonoscopy. You may have a sigmoidoscopy every 5 years or a colonoscopy every 10 years starting at age 47.  Hepatitis C blood test.  Hepatitis B blood test.  Sexually transmitted disease (STD) testing.  Diabetes screening. This is done by checking your blood sugar (glucose) after you have not eaten for a while (fasting). You may have this done every 1-3 years.  Bone density scan. This is done to screen for osteoporosis. You may have this done starting at age 78.  Mammogram. This may be done every 1-2 years. Talk to your health care provider about how often you should have regular mammograms. Talk with your health care provider about your test results, treatment options, and if necessary, the need for more tests. Vaccines  Your health care provider may recommend certain vaccines, such as:  Influenza vaccine. This is recommended every year.  Tetanus, diphtheria, and acellular pertussis (Tdap, Td) vaccine. You may need a Td booster every 10 years.  Zoster vaccine. You may need this after age 24.  Pneumococcal 13-valent conjugate (PCV13) vaccine.  One dose is recommended after age 9.  Pneumococcal polysaccharide (PPSV23) vaccine. One dose is recommended after age 72. Talk to your health care provider about which screenings and vaccines you need and how often you need them. This information is not intended to replace advice given to you by your health care provider. Make sure you discuss any questions you have with your health care provider. Document Released: 04/05/2015 Document Revised: 11/27/2015 Document Reviewed: 01/08/2015 Elsevier Interactive Patient Education  2017 [place] Prevention in the Home Falls can cause injuries. They can happen to people of all ages. There are many things you can do to make your home safe and to help prevent falls. What can I do on the outside of my home?  Regularly fix the edges of walkways and driveways and fix any cracks.  Remove anything that might make you trip as you walk through a door, such as a raised step or threshold.  Trim any bushes or trees on the path to your home.  Use bright outdoor lighting.  Clear any walking paths of anything that might make someone trip, such as rocks or tools.  Regularly check to see if handrails are loose or broken. Make sure that both sides of any steps have handrails.  Any raised decks and porches should have guardrails on the edges.  Have any leaves, snow, or ice cleared regularly.  Use sand or salt on walking paths during winter.  Clean up any spills in your garage right away. This includes oil or grease spills. What can I do in the bathroom?  Use night lights.  Install grab bars by the toilet and in the tub and shower. Do not use towel bars as grab bars.  Use non-skid mats or decals in the tub or shower.  If you need to sit down in the shower, use a plastic, non-slip stool.  Keep the floor dry. Clean up any water that spills on the floor as soon as it happens.  Remove soap buildup in the tub or shower regularly.  Attach bath  mats securely with double-sided non-slip rug tape.  Do not have throw rugs and other things on the floor that can make you trip. What can I do in the bedroom?  Use night lights.  Make sure that you have a light by your bed that is easy to reach.  Do not use any sheets or blankets that are too big for your bed. They should not hang down onto the floor.  Have a firm chair that has side arms. You can use this for support while you get dressed.  Do not have throw rugs and other things on the floor that can make you trip. What can I do in the kitchen?  Clean up any spills right away.  Avoid walking on wet floors.  Keep items that you use a lot in easy-to-reach places.  If you need to reach something above you, use a strong step stool that has a grab bar.  Keep electrical cords out of the way.  Do not use floor polish or wax that makes floors slippery. If you must use wax, use non-skid floor wax.  Do not have throw rugs and other things on the floor that  can make you trip. What can I do with my stairs?  Do not leave any items on the stairs.  Make sure that there are handrails on both sides of the stairs and use them. Fix handrails that are broken or loose. Make sure that handrails are as long as the stairways.  Check any carpeting to make sure that it is firmly attached to the stairs. Fix any carpet that is loose or worn.  Avoid having throw rugs at the top or bottom of the stairs. If you do have throw rugs, attach them to the floor with carpet tape.  Make sure that you have a light switch at the top of the stairs and the bottom of the stairs. If you do not have them, ask someone to add them for you. What else can I do to help prevent falls?  Wear shoes that:  Do not have high heels.  Have rubber bottoms.  Are comfortable and fit you well.  Are closed at the toe. Do not wear sandals.  If you use a stepladder:  Make sure that it is fully opened. Do not climb a closed  stepladder.  Make sure that both sides of the stepladder are locked into place.  Ask someone to hold it for you, if possible.  Clearly mark and make sure that you can see:  Any grab bars or handrails.  First and last steps.  Where the edge of each step is.  Use tools that help you move around (mobility aids) if they are needed. These include:  Canes.  Walkers.  Scooters.  Crutches.  Turn on the lights when you go into a dark area. Replace any light bulbs as soon as they burn out.  Set up your furniture so you have a clear path. Avoid moving your furniture around.  If any of your floors are uneven, fix them.  If there are any pets around you, be aware of where they are.  Review your medicines with your doctor. Some medicines can make you feel dizzy. This can increase your chance of falling. Ask your doctor what other things that you can do to help prevent falls. This information is not intended to replace advice given to you by your health care provider. Make sure you discuss any questions you have with your health care provider. Document Released: 01/03/2009 Document Revised: 08/15/2015 Document Reviewed: 04/13/2014 Elsevier Interactive Patient Education  2017 [employer].

## 2020-07-19 ENCOUNTER — Other Ambulatory Visit: Payer: Self-pay | Admitting: Family Medicine

## 2020-07-19 DIAGNOSIS — Z1231 Encounter for screening mammogram for malignant neoplasm of breast: Secondary | ICD-10-CM

## 2020-07-22 ENCOUNTER — Ambulatory Visit (INDEPENDENT_AMBULATORY_CARE_PROVIDER_SITE_OTHER): Payer: Medicare Other

## 2020-07-22 DIAGNOSIS — I639 Cerebral infarction, unspecified: Secondary | ICD-10-CM

## 2020-07-24 LAB — CUP PACEART REMOTE DEVICE CHECK
Date Time Interrogation Session: 20220503111606
Implantable Pulse Generator Implant Date: 20210810

## 2020-07-30 LAB — CUP PACEART REMOTE DEVICE CHECK
Date Time Interrogation Session: 20220508234748
Implantable Pulse Generator Implant Date: 20210810

## 2020-08-01 ENCOUNTER — Other Ambulatory Visit (HOSPITAL_COMMUNITY): Payer: Self-pay | Admitting: Family Medicine

## 2020-08-01 ENCOUNTER — Other Ambulatory Visit: Payer: Self-pay

## 2020-08-01 ENCOUNTER — Ambulatory Visit (HOSPITAL_COMMUNITY)
Admission: RE | Admit: 2020-08-01 | Discharge: 2020-08-01 | Disposition: A | Discharge status: Home / Self Care | Payer: Medicare Other | Source: Ambulatory Visit | Attending: Family Medicine | Admitting: Family Medicine

## 2020-08-01 DIAGNOSIS — Z1231 Encounter for screening mammogram for malignant neoplasm of breast: Secondary | ICD-10-CM | POA: Insufficient documentation

## 2020-08-13 NOTE — Progress Notes (Signed)
[facility] Summary Report / Loop Recorder 

## 2020-08-15 DIAGNOSIS — H02132 Senile ectropion of right lower eyelid: Secondary | ICD-10-CM | POA: Diagnosis not present

## 2020-08-15 DIAGNOSIS — H04221 Epiphora due to insufficient drainage, right lacrimal gland: Secondary | ICD-10-CM | POA: Diagnosis not present

## 2020-08-15 DIAGNOSIS — H04551 Acquired stenosis of right nasolacrimal duct: Secondary | ICD-10-CM | POA: Diagnosis not present

## 2020-08-26 ENCOUNTER — Ambulatory Visit (INDEPENDENT_AMBULATORY_CARE_PROVIDER_SITE_OTHER): Payer: Medicare Other

## 2020-08-26 DIAGNOSIS — I639 Cerebral infarction, unspecified: Secondary | ICD-10-CM

## 2020-09-02 LAB — CUP PACEART REMOTE DEVICE CHECK
Date Time Interrogation Session: 20220610234448
Implantable Pulse Generator Implant Date: 20210810

## 2020-09-11 ENCOUNTER — Telehealth: Payer: Self-pay | Admitting: *Deleted

## 2020-09-11 NOTE — Telephone Encounter (Signed)
   Name: Kerry Richardson  DOB: Jun 22, 1940  MRN: [id number]   Primary Cardiologist: Hillis Range, MD  Chart reviewed as part of pre-operative protocol coverage. Patient was contacted 09/11/2020 in reference to pre-operative risk assessment for pending surgery as outlined below.  Kerry Richardson was last seen on 8/10//21 by Dr. Johney Frame.  Since that day, Kerry Richardson has done well. She does not have a history of CAD, MI, or PCI. She can complete 4.0 METS without angina. She takes ASA for history of stroke. Please reach out to neurology for ASA hold.  Therefore, based on ACC/AHA guidelines, the patient would be at acceptable risk for the planned procedure without further cardiovascular testing.   The patient was advised that if she develops new symptoms prior to surgery to contact our office to arrange for a follow-up visit, and she verbalized understanding.  I will route this recommendation to the requesting party via Epic fax function and remove from pre-op pool. Please call with questions.  Roe Rutherford Dexter Signor, PA 09/11/2020, 2:35 PM

## 2020-09-11 NOTE — Telephone Encounter (Signed)
   [facility] Pre-operative Risk Assessment    Patient Name: Kerry Richardson  DOB: September 05, 1940  MRN: [id number]   HEARTCARE STAFF: - Please ensure there is not already an duplicate clearance open for this procedure. - Under Visit Info/Reason for Call, type in Other and utilize the format Clearance MM/DD/YY or Clearance TBD. Do not use dashes or single digits. - If request is for dental extraction, please clarify the # of teeth to be extracted. - If the patient is currently at the dentist's office, call Pre-Op APP to address. If the patient is not currently in the dentist office, please route to the Pre-Op pool  Request for surgical clearance:  What type of surgery is being performed? RIGHT EXTERNAL DCR, RIGHT LOWER LID ECTROPION REPAIR, RIGHT SILICONE TUBE  INTUBATION   When is this surgery scheduled? 09/25/20   What type of clearance is required (medical clearance vs. Pharmacy clearance to hold med vs. Both)? MEDICAL  Are there any medications that need to be held prior to surgery and how long? ASA    Practice name and name of physician performing surgery? [facility]; DR. Sabino Dick   What is the office phone number? [phone]   7.   What is the office fax number? [phone]  8.   Anesthesia type (None, local, MAC, general) ? GENERAL   Julaine Hua 09/11/2020, 12:05 PM  _________________________________________________________________   (provider comments below)

## 2020-09-16 ENCOUNTER — Telehealth: Payer: Self-pay | Admitting: Family Medicine

## 2020-09-16 NOTE — Telephone Encounter (Addendum)
The heart doctor told her she have to get clearance from Neurologist since it was for CVA no heart condition but she doesn't even have a neurologist and just needs the form to stop the 81 mg ASA to have surgery next week. Patient states she doesn't even see the cardiologist on the regular either but doesn't know what to do at this point to have the form completed for her surgery. Please advise

## 2020-09-16 NOTE — Telephone Encounter (Signed)
Pls call pt have have her discuss with cardiology if she can come off her 81mg  asa since h/o heart condition and h/o cva.   Thx,   Dr. 

## 2020-09-16 NOTE — Telephone Encounter (Signed)
Pt is calling in stating that she will be having surgery, July 6th. A fax will be coming over regarding pt being taken off of asprin before surgery for Dr.Taylor to fill out.

## 2020-09-16 NOTE — Telephone Encounter (Signed)
RIGHT EXTERNAL DCR, RIGHT LOWER LID ECTROPION REPAIR, RIGHT SILICONE TUBE  INTUBATION

## 2020-09-17 ENCOUNTER — Telehealth: Payer: Self-pay | Admitting: Adult Health

## 2020-09-17 NOTE — Telephone Encounter (Signed)
Received clearance form, to JM/NP for review and signature. I faxed that since we have not seen pt since 10-2019, and she did not r/s f/u appt after cancelling appt then per JM/NP she will need appt for clearance relating to stroke standpoint.  I relayed this to pt as well.  I did not have sooner appt then 11/2020 unless there is a cancellation.  She will call back if needed.

## 2020-09-17 NOTE — Telephone Encounter (Signed)
Called Jenna at [facility].  Pt to have procedure 09-25-20 EXT. DCR R tear duct.  Will fax another clearance form to [phone].

## 2020-09-17 NOTE — Telephone Encounter (Signed)
Patient states she does not want to go to neurology and is going to call her surgeon and see what they recommend or advised for her to do to proceed with surgery and she will call us back and let us know if she needs anything or wants referral to neurology.

## 2020-09-17 NOTE — Telephone Encounter (Signed)
I had only seen her for hospital follow-up in 10/2019 and she did not follow-up as advised.  She will need to have a follow-up visit for clearance if needed from a stroke standpoint

## 2020-09-17 NOTE — Telephone Encounter (Signed)
Kerry Richardson from [facility] called stating she sent over a fax for medical clearance on June 22nd, pt is having surgery on July 6th. Wanting to know when pt can stop her Asprin due to her history of stroke.

## 2020-09-17 NOTE — Progress Notes (Signed)
[facility] Summary Report / Loop Recorder 

## 2020-09-17 NOTE — Telephone Encounter (Signed)
Yes, pls give neurology consult urgent if possible to see if they can review for her pre-op.  Also ask pt if the surgery is emergent or can wait till seen by neuro?   Dr. Ladona Ridgel

## 2020-09-18 ENCOUNTER — Telehealth: Payer: Self-pay | Admitting: Family Medicine

## 2020-09-18 NOTE — Telephone Encounter (Signed)
Kept form in file (if needed in future)

## 2020-09-18 NOTE — Telephone Encounter (Signed)
Patient needs appt with neurologist sooner than their first available date in September. Patient called to ask if this office is able to arrange an appointment with her neurologist within the next few days (by next week). Please advise at [phone]

## 2020-09-19 ENCOUNTER — Ambulatory Visit: Payer: Medicare Other | Admitting: Adult Health

## 2020-09-19 ENCOUNTER — Encounter: Payer: Self-pay | Admitting: Adult Health

## 2020-09-19 ENCOUNTER — Telehealth: Payer: Self-pay | Admitting: *Deleted

## 2020-09-19 VITALS — BP 147/79 | HR 71 | Ht 60.0 in | Wt 197.0 lb

## 2020-09-19 DIAGNOSIS — I639 Cerebral infarction, unspecified: Secondary | ICD-10-CM

## 2020-09-19 DIAGNOSIS — Z01818 Encounter for other preprocedural examination: Secondary | ICD-10-CM

## 2020-09-19 NOTE — Progress Notes (Signed)
[facility] [address] [place] [address] [phone]       STROKE FOLLOW UP NOTE  Ms. Vertell Limber Date of Birth:  1940/07/05 Medical Record Number:  [id number]   Reason for Referral: stroke follow up    SUBJECTIVE:   CHIEF COMPLAINT:  Chief Complaint  Patient presents with   Follow-up    Rm 14 alone Pt is well and stable     HPI:   Today, 09/19/2020, Ms. Champagne returns for surgical clearance for R tear duct repair procedure currently scheduled 7/6. She was previously seen 10/2019 but lost to follow up. She has been doing well since prior visit without new stroke/TIA symptoms. Reports on compliance on aspirin and atorvastatin without associated side effects. Blood pressure today 147/79.  Routinely monitors at home and typically stable.  No concerns at this time.     History provided for reference purposes only Initial visit 11/21/2019 JM: Ms. Lawn is being seen for hospital follow-up  She recovered well without residual deficits She has returned back to prior activities without difficulty  She has been having difficulty with decreased energy but has been slowly improving Denies new stroke/TIA symptoms  Completed 3 weeks DAPT remains on aspirin alone without bleeding or bruising While on DAPT, concerns for orbital bruising therefore plavix discontinued 2 days early with resolution of bruising Initiate atorvastatin at hospital discharge but she refuses statin therapy due to family history of side effects Blood pressure today 148/68 - monitors at home and has been stable - reports this AM 119/62  Loop recorder placed by Dr. Johney Frame on 8/10 -first check will be completed on 9/9  No further concerns at this time  Stroke admission 10/27/2019 Ms. VANNARY GREENING is a 80 y.o. female with history of hypertension, migraine headaches and obesity who presented to the emergency department on 10/27/2019 with confusion and difficulty finding words following a  severe headache.   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  Recommended placement of loop recorder outpatient to rule out atrial fibrillation.  Recommended DAPT for 3 weeks and aspirin alone. Hx of HTN on amlodipine 2.5 mg daily and increase to 5 mg daily with long-term BP goal normotensive range.  LDL 135 and initiate atorvastatin 40 mg daily.  Other stroke risk factors include advanced age, obesity and migraines.  No prior stroke history.  Stroke: Scattered early subacute left MCA territory infarcts - embolic - source unknown MRI head  - Small early subacute left MCA territory infarcts. Mild chronic small vessel ischemic disease.  CTA H&N - Negative for carotid or vertebral artery stenosis in the neck. Negative for intracranial large vessel occlusion.  Carotid Doppler - unremarkable 2D Echo - EF 60 - 65%. No cardiac source of emboli identified.  Recommend outpatient loop recorder placement to rule out A. fib.  Discussed with Dr. Johney Frame. Sars Corona Virus 2 - negative LDL - 135 HgbA1c - 6.2 VTE prophylaxis - Lovenox No antithrombotic prior to admission, now on aspirin 81 mg daily and clopidogrel 75 mg daily.  Continue DAPT for 3 weeks and then aspirin alone. Patient counseled to be compliant with her antithrombotic medications Ongoing aggressive stroke risk factor management Therapy recommendations: No therapy needs Disposition: Home     ROS:   14 system review of systems performed and negative with exception of no complaints  PMH:  Past Medical History:  Diagnosis Date   Allergy    chronic  Alpha galactosidase deficiency    Arthritis    Back pain    Bulging discs    Bursitis    DDD (degenerative disc disease)    Dyspnea    with exertion   GERD (gastroesophageal reflux disease)    mild   Glaucoma    H/O removal of cyst    finger (uncertain of which finger)   Migraine headache    Neck pain    PONV  (postoperative nausea and vomiting)    Reflux    Seasonal allergies     PSH:  Past Surgical History:  Procedure Laterality Date   ABDOMINAL HYSTERECTOMY     CARPAL TUNNEL RELEASE     bilateral   CATARACT EXTRACTION W/PHACO Left 10/01/2014   Procedure: CATARACT EXTRACTION PHACO AND INTRAOCULAR LENS PLACEMENT LEFT EYE;  Surgeon: Gemma PayorKerry Hunt, MD;  Location: [facility];  Service: Ophthalmology;  Laterality: Left;  CDE:8.15   CATARACT EXTRACTION W/PHACO Right 08/02/2017   Procedure: CATARACT EXTRACTION WITH PHACOEMULSIFICATION  AND INTRAOCULAR LENS PLACEMENT AND PLACEMENT OF iSTENT GLAUCOMA DEVICE RIGHT EYE;  Surgeon: Gemma PayorHunt, Kerry, MD;  Location: [facility];  Service: Ophthalmology;  Laterality: Right;  CDE: 7.77   CERVICAL FUSION     CHOLECYSTECTOMY N/A 05/15/2013   Procedure: LAPAROSCOPIC CHOLECYSTECTOMY WITH INTRAOPERATIVE CHOLANGIOGRAM;  Surgeon: Dalia HeadingMark A Jenkins, MD;  Location: [facility];  Service: General;  Laterality: N/A;   CHONDROPLASTY  03/14/2012   Procedure: CHONDROPLASTY;  Surgeon: Vickki HearingStanley E Harrison, MD;  Location: [facility];  Service: Orthopedics;  Laterality: Left;  medial condyle   CHONDROPLASTY Right 12/23/2012   Procedure: KNEE ARTHROSCOPY WITH CHONDROPLASTY WITH LIMITED DEBRIDMENT;  Surgeon: Vickki HearingStanley E Harrison, MD;  Location: [facility];  Service: Orthopedics;  Laterality: Right;   FOOT SURGERY Bilateral    morton's neuroma   implantable loop recorder placement  10/31/2019    Medtronic Reveal Linq model LNQ 11 (SN [id number]) implantable loop recorder implanted by Dr Johney FrameAllred for cryptogenic stroke   KNEE ARTHROSCOPY WITH MEDIAL MENISECTOMY  03/14/2012   Procedure: KNEE ARTHROSCOPY WITH MEDIAL MENISECTOMY;  Surgeon: Vickki HearingStanley E Harrison, MD;  Location: [facility];  Service: Orthopedics;  Laterality: Left;    Social History:  Social History   Socioeconomic History   Marital status: Widowed    Spouse name: Not on file   Number of children: Not on file   Years of education: 12+   Highest education  level: Not on file  Occupational History   Occupation: realtor  Tobacco Use   Smoking status: Never   Smokeless tobacco: Never  Vaping Use   Vaping Use: Never used  Substance and Sexual Activity   Alcohol use: No   Drug use: No   Sexual activity: Never  Other Topics Concern   Not on file  Social History Narrative   Not on file   Social Determinants of Health   Financial Resource Strain: Not on file  Food Insecurity: Not on file  Transportation Needs: Not on file  Physical Activity: Not on file  Stress: Not on file  Social Connections: Moderately Isolated   Frequency of Communication with Friends and Family: More than three times a week   Frequency of Social Gatherings with Friends and Family: Twice a week   Attends Religious Services: More than 4 times per year   Active Member of [employer] or Organizations: No   Attends Banker[employer] or Organization Meetings: Never   Marital Status: Widowed  Intimate Partner Violence: Not on file    Family History:  Family  History  Problem Relation Age of Onset   Hypertension Mother        borderline    Medications:   Current Outpatient Medications on File Prior to Visit  Medication Sig Dispense Refill   amLODipine (NORVASC) 5 MG tablet Take 1 tablet (5 mg total) by mouth daily. 90 tablet 1   aspirin EC 81 MG EC tablet Take 1 tablet (81 mg total) by mouth daily. Swallow whole. 30 tablet 11   Cholecalciferol (VITAMIN D) 125 MCG (5000 UT) CAPS Take 5,000 Units by mouth daily.      ketoconazole (NIZORAL) 2 % cream APPLY TO AFFECTED AREA TWICE DAILY. (Patient taking differently: Apply 1 application topically daily. For prevention of abdominal rash) 60 g 5   NIACINAMIDE PO Take 1 tablet by mouth daily.     OVER THE COUNTER MEDICATION Take 2 capsules by mouth daily. Lion's Mane Mushroom     triamcinolone cream (KENALOG) 0.1 % Apply 1 application topically 2 (two) times daily as needed (itching).      Zinc 50 MG TABS Take 50 mg by mouth daily.     No  current facility-administered medications on file prior to visit.    Allergies:   Allergies  Allergen Reactions   Codeine Nausea Only      OBJECTIVE:  Physical Exam  Vitals:   09/19/20 1234  BP: (!) 147/79  Pulse: 71  Weight: 197 lb (89.4 kg)  Height: 5' (1.524 m)   Body mass index is 38.47 kg/m. No results found.  General: well developed, well nourished, very pleasant elderly Caucasian female, seated, in no evident distress Head: head normocephalic and atraumatic.   Neck: supple with no carotid or supraclavicular bruits Cardiovascular: regular rate and rhythm, no murmurs Musculoskeletal: no deformity Skin:  no rash/petichiae Vascular:  Normal pulses all extremities   Neurologic Exam Mental Status: Awake and fully alert.  Fluent speech and language.  Oriented to place and time. Recent and remote memory intact. Attention span, concentration and fund of knowledge appropriate. Mood and affect appropriate.  Cranial Nerves: Pupils equal, briskly reactive to light. Extraocular movements full without nystagmus. Visual fields full to confrontation. Hearing intact. Facial sensation intact. Face, tongue, palate moves normally and symmetrically.  Motor: Normal bulk and tone. Normal strength in all tested extremity muscles. Sensory.: intact to touch , pinprick , position and vibratory sensation.  Coordination: Rapid alternating movements normal in all extremities. Finger-to-nose and heel-to-shin performed accurately bilaterally. Gait and Station: Arises from chair without difficulty. Stance is normal. Gait demonstrates normal stride length and balance without use of assistive device Reflexes: 1+ and symmetric. Toes downgoing.       ASSESSMENT: Kerry Richardson is a 80 y.o. year old female presented with confusion and difficulty finding words following a severe headache on 10/27/2019 with stroke work-up revealing scattered early subacute left MCA territory infarct, embolic secondary to  unknown source s/p loop recorder placement on 8/10. Vascular risk factors include HTN, HLD, migraines and obesity.      PLAN:  Left MCA stroke, cryptogenic:  Stable without new or reoccurring stroke/TIA symptoms Loop recorder will continue to be monitored for possible atrial fibrillation.   Continue aspirin 81 mg daily  and atorvastatin for secondary stroke prevention.  Close PCP follow up for aggressive stroke risk factor management including HTN with BP<130/90 and HLD with LDL goal<70 Surgical clearance: Stable from stroke standpoint with prior stroke recurrence almost 1 year ago.  She does not have any residual deficits or any  reoccurring symptoms since that time.  She is cleared to undergo surgical procedure of R tear duct with holding aspirin 3 to 5 days prior with small but acceptable preprocedure risk of recurrent stroke while off therapy.  It is recommended to restart immediately after or once hemodynamically stable.  Patient verbalized understanding.     CC:  [facility] provider: Dr. Julius Bowels, Malena M, DO     Ihor Austin, [facility]  [facility] [address] [place], [place] [address]  Phone [phone] Fax [phone] Note: This document was prepared with digital dictation and possible smart phrase technology. Any transcriptional errors that result from this process are unintentional.

## 2020-09-19 NOTE — Telephone Encounter (Signed)
Note opened in error.

## 2020-09-20 HISTORY — PX: TEAR DUCT PROBING: SHX793

## 2020-09-20 NOTE — Progress Notes (Signed)
I agree with the above plan 

## 2020-09-24 ENCOUNTER — Telehealth: Payer: Self-pay | Admitting: Adult Health

## 2020-09-24 NOTE — Telephone Encounter (Signed)
Called [facility], spoke with [place] and advised her of NP's note of surgical clearance. They will need letter Fax to [phone]. Clearance letter composed, reviewed , signed by NP, faxed to [facility].

## 2020-09-24 NOTE — Telephone Encounter (Signed)
Kerry Richardson from [facility] called requesting a call back needs to know about clearance, pt is scheduled for surgery tomorrow.

## 2020-09-25 ENCOUNTER — Other Ambulatory Visit: Payer: Self-pay

## 2020-09-25 DIAGNOSIS — H04221 Epiphora due to insufficient drainage, right lacrimal gland: Secondary | ICD-10-CM | POA: Diagnosis not present

## 2020-09-25 DIAGNOSIS — H04511 Dacryolith of right lacrimal passage: Secondary | ICD-10-CM | POA: Diagnosis not present

## 2020-09-25 DIAGNOSIS — H02102 Unspecified ectropion of right lower eyelid: Secondary | ICD-10-CM | POA: Diagnosis not present

## 2020-09-25 DIAGNOSIS — H04551 Acquired stenosis of right nasolacrimal duct: Secondary | ICD-10-CM | POA: Diagnosis not present

## 2020-09-25 DIAGNOSIS — H02132 Senile ectropion of right lower eyelid: Secondary | ICD-10-CM | POA: Diagnosis not present

## 2020-09-25 DIAGNOSIS — H04411 Chronic dacryocystitis of right lacrimal passage: Secondary | ICD-10-CM | POA: Diagnosis not present

## 2020-09-25 NOTE — Telephone Encounter (Signed)
Received surgical clearance letter from [facility], placed on NP desk for completion, signature.

## 2020-09-30 ENCOUNTER — Ambulatory Visit (INDEPENDENT_AMBULATORY_CARE_PROVIDER_SITE_OTHER): Payer: Medicare Other

## 2020-09-30 DIAGNOSIS — I639 Cerebral infarction, unspecified: Secondary | ICD-10-CM

## 2020-10-01 LAB — CUP PACEART REMOTE DEVICE CHECK
Date Time Interrogation Session: 20220712132048
Implantable Pulse Generator Implant Date: 20210810

## 2020-10-23 NOTE — Progress Notes (Signed)
[facility] Summary Report / Loop Recorder 

## 2020-10-30 DIAGNOSIS — H04221 Epiphora due to insufficient drainage, right lacrimal gland: Secondary | ICD-10-CM | POA: Diagnosis not present

## 2020-10-30 DIAGNOSIS — Z961 Presence of intraocular lens: Secondary | ICD-10-CM | POA: Diagnosis not present

## 2020-10-30 DIAGNOSIS — H401132 Primary open-angle glaucoma, bilateral, moderate stage: Secondary | ICD-10-CM | POA: Diagnosis not present

## 2020-11-04 ENCOUNTER — Ambulatory Visit (INDEPENDENT_AMBULATORY_CARE_PROVIDER_SITE_OTHER): Payer: Medicare Other

## 2020-11-04 DIAGNOSIS — I639 Cerebral infarction, unspecified: Secondary | ICD-10-CM

## 2020-11-05 LAB — CUP PACEART REMOTE DEVICE CHECK
Date Time Interrogation Session: 20220815235400
Implantable Pulse Generator Implant Date: 20210810

## 2020-11-23 NOTE — Progress Notes (Signed)
[facility] Summary Report / Loop Recorder 

## 2020-12-02 ENCOUNTER — Other Ambulatory Visit: Payer: Self-pay | Admitting: Family Medicine

## 2020-12-02 DIAGNOSIS — I10 Essential (primary) hypertension: Secondary | ICD-10-CM

## 2020-12-05 DIAGNOSIS — H401132 Primary open-angle glaucoma, bilateral, moderate stage: Secondary | ICD-10-CM | POA: Diagnosis not present

## 2020-12-09 ENCOUNTER — Ambulatory Visit: Payer: Medicare Other

## 2020-12-09 DIAGNOSIS — I639 Cerebral infarction, unspecified: Secondary | ICD-10-CM

## 2020-12-10 LAB — CUP PACEART REMOTE DEVICE CHECK
Date Time Interrogation Session: 20220917235303
Implantable Pulse Generator Implant Date: 20210810

## 2021-01-13 ENCOUNTER — Ambulatory Visit (INDEPENDENT_AMBULATORY_CARE_PROVIDER_SITE_OTHER): Payer: Medicare Other

## 2021-01-13 DIAGNOSIS — I639 Cerebral infarction, unspecified: Secondary | ICD-10-CM | POA: Diagnosis not present

## 2021-01-13 LAB — CUP PACEART REMOTE DEVICE CHECK
Date Time Interrogation Session: 20221020235749
Implantable Pulse Generator Implant Date: 20210810

## 2021-01-21 NOTE — Progress Notes (Signed)
[facility] Summary Report / Loop Recorder 

## 2021-02-17 ENCOUNTER — Ambulatory Visit (INDEPENDENT_AMBULATORY_CARE_PROVIDER_SITE_OTHER): Payer: Medicare Other

## 2021-02-17 DIAGNOSIS — I639 Cerebral infarction, unspecified: Secondary | ICD-10-CM | POA: Diagnosis not present

## 2021-02-17 LAB — CUP PACEART REMOTE DEVICE CHECK
Date Time Interrogation Session: 20221122225934
Implantable Pulse Generator Implant Date: 20210810

## 2021-02-25 DIAGNOSIS — C44321 Squamous cell carcinoma of skin of nose: Secondary | ICD-10-CM | POA: Diagnosis not present

## 2021-02-25 DIAGNOSIS — L821 Other seborrheic keratosis: Secondary | ICD-10-CM | POA: Diagnosis not present

## 2021-02-25 DIAGNOSIS — D2239 Melanocytic nevi of other parts of face: Secondary | ICD-10-CM | POA: Diagnosis not present

## 2021-02-25 DIAGNOSIS — L814 Other melanin hyperpigmentation: Secondary | ICD-10-CM | POA: Diagnosis not present

## 2021-02-25 DIAGNOSIS — D1801 Hemangioma of skin and subcutaneous tissue: Secondary | ICD-10-CM | POA: Diagnosis not present

## 2021-02-25 DIAGNOSIS — L570 Actinic keratosis: Secondary | ICD-10-CM | POA: Diagnosis not present

## 2021-02-25 NOTE — Progress Notes (Signed)
[facility] Summary Report / Loop Recorder 

## 2021-03-04 ENCOUNTER — Telehealth: Payer: Self-pay | Admitting: Family Medicine

## 2021-03-04 DIAGNOSIS — I10 Essential (primary) hypertension: Secondary | ICD-10-CM

## 2021-03-04 MED ORDER — AMLODIPINE BESYLATE 5 MG PO TABS: 5.0000 mg | ORAL_TABLET | Freq: Every day | ORAL | 0 refills | Status: DC

## 2021-03-04 NOTE — Telephone Encounter (Signed)
Pt needs a refill on her Amlodapine. Pt has an appt to est care with Dr Adriana Simas on 03/10/21.  [phone]

## 2021-03-04 NOTE — Telephone Encounter (Signed)
Refill sent to pharmacy and pt is aware. 

## 2021-03-10 ENCOUNTER — Other Ambulatory Visit: Payer: Self-pay

## 2021-03-10 ENCOUNTER — Ambulatory Visit (INDEPENDENT_AMBULATORY_CARE_PROVIDER_SITE_OTHER): Payer: Medicare Other | Admitting: Family Medicine

## 2021-03-10 ENCOUNTER — Encounter: Payer: Self-pay | Admitting: Family Medicine

## 2021-03-10 VITALS — BP 148/71 | HR 70 | Temp 98.6°F | Ht 60.0 in | Wt 203.0 lb

## 2021-03-10 DIAGNOSIS — I639 Cerebral infarction, unspecified: Secondary | ICD-10-CM | POA: Diagnosis not present

## 2021-03-10 DIAGNOSIS — I10 Essential (primary) hypertension: Secondary | ICD-10-CM

## 2021-03-10 DIAGNOSIS — R7303 Prediabetes: Secondary | ICD-10-CM | POA: Insufficient documentation

## 2021-03-10 DIAGNOSIS — M545 Low back pain, unspecified: Secondary | ICD-10-CM

## 2021-03-10 DIAGNOSIS — M5441 Lumbago with sciatica, right side: Secondary | ICD-10-CM | POA: Insufficient documentation

## 2021-03-10 DIAGNOSIS — E7800 Pure hypercholesterolemia, unspecified: Secondary | ICD-10-CM

## 2021-03-10 DIAGNOSIS — Z8673 Personal history of transient ischemic attack (TIA), and cerebral infarction without residual deficits: Secondary | ICD-10-CM | POA: Insufficient documentation

## 2021-03-10 DIAGNOSIS — G8929 Other chronic pain: Secondary | ICD-10-CM | POA: Insufficient documentation

## 2021-03-10 NOTE — Patient Instructions (Signed)
Continue your current medications.  Consider cholesterol medication.  Follow up in 6 months.  Xray ordered.

## 2021-03-10 NOTE — Assessment & Plan Note (Signed)
Follows with neurology and cardiology.  Has a loop recorder in place.  There has been no detection of atrial fibrillation.  Remains on aspirin therapy.

## 2021-03-10 NOTE — Assessment & Plan Note (Signed)
Patient reports that her blood pressures at home are in the 130's/70s.  Continue amlodipine.

## 2021-03-10 NOTE — Assessment & Plan Note (Signed)
X-ray for further evaluation today.  Discussed using anti-inflammatories sparingly.

## 2021-03-10 NOTE — Progress Notes (Signed)
Subjective:  Patient ID: Kerry Richardson, female    DOB: May 29, 1940  Age: 80 y.o. MRN: [id number]  CC: Chief Complaint  Patient presents with   Establish Care   Tailbone Pain    Mainly R side for several months- painful with walking- uses cane with longer distances, sitting or laying helps. Hx of arthritis in spine     HPI:  80 year old female with hypertension, cryptogenic stroke, hyperlipidemia, prediabetes presents for follow-up and to establish care with me.  Patient reports pain of her low back and into her right sacrum and buttock.  This has been going on for months.  She has a history of back issues.  She is taking Tylenol without significant improvement.  She has thus stopped the Tylenol as it has not been helping.  Her hypertension is well controlled on amlodipine per her home readings.  She states that she routinely has elevated systolic blood pressures in the office.  Patient is not on any therapy regarding hyperlipidemia.  Patient states that she refuses to take statin therapy as she has seen to affect the memories of her family members who have taken it previously.  We will discuss other options regarding  Patient Active Problem List   Diagnosis Date Noted   Prediabetes 03/10/2021   Chronic right-sided low back pain 03/10/2021   Pure hypercholesterolemia    Cryptogenic stroke ([place]) 10/27/2019   Seasonal allergic rhinitis 02/22/2017   Essential hypertension 07/12/2013   Osteoarthritis, knee 12/03/2011    Social Hx   Social History   Socioeconomic History   Marital status: Widowed    Spouse name: Not on file   Number of children: Not on file   Years of education: 12+   Highest education level: Not on file  Occupational History   Occupation: realtor  Tobacco Use   Smoking status: Never   Smokeless tobacco: Never  Vaping Use   Vaping Use: Never used  Substance and Sexual Activity   Alcohol use: No   Drug use: No   Sexual activity: Never  Other Topics  Concern   Not on file  Social History Narrative   Not on file   Social Determinants of Health   Financial Resource Strain: Not on file  Food Insecurity: Not on file  Transportation Needs: Not on file  Physical Activity: Not on file  Stress: Not on file  Social Connections: Moderately Isolated   Frequency of Communication with Friends and Family: More than three times a week   Frequency of Social Gatherings with Friends and Family: Twice a week   Attends Religious Services: More than 4 times per year   Active Member of [employer] or Organizations: No   Attends Archivist Meetings: Never   Marital Status: Widowed    Review of Systems Per HPI  Objective:  BP (!) 148/71    Pulse 70    Temp 98.6 F (37 C)    Ht 5' (1.524 m)    Wt 203 lb (92.1 kg)    SpO2 97%    BMI 39.65 kg/m   BP/Weight 03/10/2021 09/19/2020 [id number]  Systolic BP 123456 [id number]  Diastolic BP 71 79 68  Wt. (Lbs) 203 197 192.25  BMI 39.65 38.47 37.55    Physical Exam Vitals and nursing note reviewed.  Constitutional:      General: She is not in acute distress.    Appearance: Normal appearance. She is obese. She is not ill-appearing.  HENT:     Head:  Normocephalic and atraumatic.  Eyes:     General:        Right eye: No discharge.        Left eye: No discharge.     Conjunctiva/sclera: Conjunctivae normal.  Cardiovascular:     Rate and Rhythm: Normal rate and regular rhythm.     Heart sounds: No murmur heard. Pulmonary:     Effort: Pulmonary effort is normal.     Breath sounds: Normal breath sounds. No wheezing, rhonchi or rales.  Neurological:     Mental Status: She is alert.  Psychiatric:        Mood and Affect: Mood normal.        Behavior: Behavior normal.    Lab Results  Component Value Date   WBC 9.5 05/20/2020   HGB 14.5 05/20/2020   HCT 43.2 05/20/2020   PLT 340 05/20/2020   GLUCOSE 89 05/20/2020   CHOL 243 (H) 05/20/2020   TRIG 136 05/20/2020   HDL 74 05/20/2020   LDLCALC 145  (H) 05/20/2020   ALT 15 05/20/2020   AST 16 05/20/2020   NA 141 05/20/2020   K 5.2 05/20/2020   CL 101 05/20/2020   CREATININE 0.72 05/20/2020   BUN 16 05/20/2020   CO2 23 05/20/2020   TSH 1.780 06/27/2014   INR 0.9 10/27/2019   HGBA1C 6.1 (H) 05/20/2020     Assessment & Plan:   Problem List Items Addressed This Visit       Cardiovascular and Mediastinum   Essential hypertension    Patient reports that her blood pressures at home are in the 130's/70s.  Continue amlodipine.      Cryptogenic stroke [facility])    Follows with neurology and cardiology.  Has a loop recorder in place.  There has been no detection of atrial fibrillation.  Remains on aspirin therapy.        Other   Pure hypercholesterolemia    We discussed other options regarding lipid-lowering therapy, PSK 9, Zetia.  Patient will do some research and contemplate.      Chronic right-sided low back pain - Primary    X-ray for further evaluation today.  Discussed using anti-inflammatories sparingly.      Relevant Orders   DG Lumbar Spine Complete   Follow-up:  Return in about 6 months (around 09/08/2021).  Everlene Other DO [facility]

## 2021-03-10 NOTE — Assessment & Plan Note (Signed)
We discussed other options regarding lipid-lowering therapy, PSK 9, Zetia.  Patient will do some research and contemplate.

## 2021-03-18 ENCOUNTER — Ambulatory Visit (INDEPENDENT_AMBULATORY_CARE_PROVIDER_SITE_OTHER): Payer: Medicare Other

## 2021-03-18 DIAGNOSIS — I639 Cerebral infarction, unspecified: Secondary | ICD-10-CM

## 2021-03-18 LAB — CUP PACEART REMOTE DEVICE CHECK
Date Time Interrogation Session: 20221225230624
Implantable Pulse Generator Implant Date: 20210810

## 2021-03-20 ENCOUNTER — Ambulatory Visit (HOSPITAL_COMMUNITY)
Admission: RE | Admit: 2021-03-20 | Discharge: 2021-03-20 | Disposition: A | Discharge status: Home / Self Care | Payer: Medicare Other | Source: Ambulatory Visit | Attending: Family Medicine | Admitting: Family Medicine

## 2021-03-20 ENCOUNTER — Other Ambulatory Visit: Payer: Self-pay

## 2021-03-20 DIAGNOSIS — M545 Low back pain, unspecified: Secondary | ICD-10-CM | POA: Diagnosis not present

## 2021-03-20 DIAGNOSIS — G8929 Other chronic pain: Secondary | ICD-10-CM | POA: Insufficient documentation

## 2021-03-20 DIAGNOSIS — M47816 Spondylosis without myelopathy or radiculopathy, lumbar region: Secondary | ICD-10-CM | POA: Diagnosis not present

## 2021-03-28 NOTE — Progress Notes (Signed)
[facility] Summary Report / Loop Recorder 

## 2021-04-20 LAB — CUP PACEART REMOTE DEVICE CHECK
Date Time Interrogation Session: 20230127231128
Implantable Pulse Generator Implant Date: 20210810

## 2021-04-21 ENCOUNTER — Other Ambulatory Visit: Payer: Self-pay | Admitting: Family Medicine

## 2021-04-21 ENCOUNTER — Ambulatory Visit (INDEPENDENT_AMBULATORY_CARE_PROVIDER_SITE_OTHER): Payer: Medicare Other

## 2021-04-21 DIAGNOSIS — I639 Cerebral infarction, unspecified: Secondary | ICD-10-CM | POA: Diagnosis not present

## 2021-04-29 NOTE — Progress Notes (Signed)
[facility] Summary Report / Loop Recorder 

## 2021-05-01 DIAGNOSIS — H01002 Unspecified blepharitis right lower eyelid: Secondary | ICD-10-CM | POA: Diagnosis not present

## 2021-05-01 DIAGNOSIS — H04221 Epiphora due to insufficient drainage, right lacrimal gland: Secondary | ICD-10-CM | POA: Diagnosis not present

## 2021-05-01 DIAGNOSIS — H401132 Primary open-angle glaucoma, bilateral, moderate stage: Secondary | ICD-10-CM | POA: Diagnosis not present

## 2021-05-01 DIAGNOSIS — H01001 Unspecified blepharitis right upper eyelid: Secondary | ICD-10-CM | POA: Diagnosis not present

## 2021-05-26 ENCOUNTER — Ambulatory Visit (INDEPENDENT_AMBULATORY_CARE_PROVIDER_SITE_OTHER): Payer: Medicare Other

## 2021-05-26 DIAGNOSIS — I639 Cerebral infarction, unspecified: Secondary | ICD-10-CM | POA: Diagnosis not present

## 2021-05-27 LAB — CUP PACEART REMOTE DEVICE CHECK
Date Time Interrogation Session: 20230305233040
Implantable Pulse Generator Implant Date: 20210810

## 2021-05-29 ENCOUNTER — Other Ambulatory Visit: Payer: Self-pay | Admitting: Family Medicine

## 2021-05-29 DIAGNOSIS — I10 Essential (primary) hypertension: Secondary | ICD-10-CM

## 2021-06-06 NOTE — Progress Notes (Signed)
[facility] Summary Report / Loop Recorder 

## 2021-06-30 ENCOUNTER — Ambulatory Visit (INDEPENDENT_AMBULATORY_CARE_PROVIDER_SITE_OTHER): Payer: Medicare Other

## 2021-06-30 DIAGNOSIS — I639 Cerebral infarction, unspecified: Secondary | ICD-10-CM | POA: Diagnosis not present

## 2021-07-02 LAB — CUP PACEART REMOTE DEVICE CHECK
Date Time Interrogation Session: 20230409230610
Implantable Pulse Generator Implant Date: 20210810

## 2021-07-10 ENCOUNTER — Encounter: Payer: Medicare Other | Admitting: Family Medicine

## 2021-07-14 ENCOUNTER — Encounter: Payer: Medicare Other | Admitting: Family Medicine

## 2021-07-15 ENCOUNTER — Ambulatory Visit (INDEPENDENT_AMBULATORY_CARE_PROVIDER_SITE_OTHER): Payer: Medicare Other

## 2021-07-15 VITALS — Ht 60.0 in | Wt 193.0 lb

## 2021-07-15 DIAGNOSIS — Z0000 Encounter for general adult medical examination without abnormal findings: Secondary | ICD-10-CM

## 2021-07-15 DIAGNOSIS — Z1231 Encounter for screening mammogram for malignant neoplasm of breast: Secondary | ICD-10-CM

## 2021-07-15 NOTE — Progress Notes (Signed)
? ?Subjective:  ? Kerry Richardson is a 81 y.o. female who presents for Medicare Annual (Subsequent) preventive examination. ?Virtual Visit via Telephone Note ? ?I connected with  Kerry Richardson on 07/15/21 at  1:00 PM EDT by telephone and verified that I am speaking with the correct person using two identifiers. ? ?Location: ?Patient: HOME ?Provider: [facility] ?Persons participating in the virtual visit: patient/Nurse Health Advisor ?  ?I discussed the limitations, risks, security and privacy concerns of performing an evaluation and management service by telephone and the availability of in person appointments. The patient expressed understanding and agreed to proceed. ? ?Interactive audio and video telecommunications were attempted between this nurse and patient, however failed, due to patient having technical difficulties OR patient did not have access to video capability.  We continued and completed visit with audio only. ? ?Some vital signs may be absent or patient reported.  ? ?[name], LPN ? ?Review of Systems    ? ?Cardiac Risk Factors include: advanced age (>56men, >27 women);dyslipidemia;hypertension;sedentary lifestyle;obesity (BMI >30kg/m2) ? ?   ?Objective:  ?  ?Today's Vitals  ? 07/15/21 1305 07/15/21 1307  ?Weight: 193 lb (87.5 kg)   ?Height: 5' (1.524 m)   ?PainSc:  4   ? ?Body mass index is 37.69 kg/m?. ? ? ?  07/15/2021  ?  1:19 PM 07/09/2020  ?  1:56 PM 10/27/2019  ?  9:00 PM 08/02/2017  ? 10:45 AM 07/26/2017  ?  1:14 PM 10/01/2014  ? 10:06 AM 09/27/2014  ?  9:08 AM  ?Advanced Directives  ?Does Patient Have a Medical Advance Directive? Yes Yes Yes Yes Yes Yes Yes  ?Type of Paramedic of [place];Living will [place];Living will [place];Living will Healthcare Power of [place];Living will [place];Living will  ?Does patient want to make changes to medical advance  directive?  No - Patient declined No - Patient declined      ?Copy of [place] in Chart? Yes - validated most recent copy scanned in chart (See row information) Yes - validated most recent copy scanned in chart (See row information)  Yes Yes Yes Yes  ? ? ?Current Medications (verified) ?Outpatient Encounter Medications as of 07/15/2021  ?Medication Sig  ? amLODipine (NORVASC) 5 MG tablet TAKE 1 TABLET BY MOUTH ONCE DAILY.  ? Ascorbic Acid (VITAMIN C) 250 MG CHEW   ? aspirin EC 81 MG EC tablet Take 1 tablet (81 mg total) by mouth daily. Swallow whole.  ? Cholecalciferol (VITAMIN D) 125 MCG (5000 UT) CAPS Take 5,000 Units by mouth daily.   ? fluorouracil (EFUDEX) 5 % cream Apply topically 2 (two) times daily.  ? ketoconazole (NIZORAL) 2 % cream APPLY TO AFFECTED AREA TWICE DAILY.  ? NIACINAMIDE PO Take 1 tablet by mouth daily.  ? OVER THE COUNTER MEDICATION Take 2 capsules by mouth daily. [place] Mushroom  ? triamcinolone cream (KENALOG) 0.1 % Apply 1 application topically 2 (two) times daily as needed (itching).   ? Zinc 50 MG TABS Take 50 mg by mouth daily.  ? ?No facility-administered encounter medications on file as of 07/15/2021.  ? ? ?Allergies (verified) ?Codeine  ? ?History: ?Past Medical History:  ?Diagnosis Date  ? Allergy   ? chronic  ? Alpha galactosidase deficiency   ? Arthritis   ? Back pain   ? Bulging discs   ? Bursitis   ?  Cataract Several years ago  ? Removed  ? DDD (degenerative disc disease)   ? Dyspnea   ? with exertion  ? GERD (gastroesophageal reflux disease)   ? mild  ? Glaucoma   ? H/O removal of cyst   ? finger (uncertain of which finger)  ? Hypertension 2021  ? Migraine headache   ? Neck pain   ? PONV (postoperative nausea and vomiting)   ? Reflux   ? Seasonal allergies   ? Stroke ([place]) 10/22/2019  ? ?Past Surgical History:  ?Procedure Laterality Date  ? ABDOMINAL HYSTERECTOMY    ? CARPAL TUNNEL RELEASE    ? bilateral  ? CATARACT EXTRACTION W/PHACO Left 10/01/2014  ?  Procedure: CATARACT EXTRACTION PHACO AND INTRAOCULAR LENS PLACEMENT LEFT EYE;  Surgeon: Tonny Branch, MD;  Location: [facility];  Service: Ophthalmology;  Laterality: Left;  CDE:8.15  ? CATARACT EXTRACTION W/PHACO Right 08/02/2017  ? Procedure: CATARACT EXTRACTION WITH PHACOEMULSIFICATION  AND INTRAOCULAR LENS PLACEMENT AND PLACEMENT OF iSTENT GLAUCOMA DEVICE RIGHT EYE;  Surgeon: Tonny Branch, MD;  Location: [facility];  Service: Ophthalmology;  Laterality: Right;  CDE: 7.77  ? CERVICAL FUSION    ? CHOLECYSTECTOMY N/A 05/15/2013  ? Procedure: LAPAROSCOPIC CHOLECYSTECTOMY WITH INTRAOPERATIVE CHOLANGIOGRAM;  Surgeon: Jamesetta So, MD;  Location: [facility];  Service: General;  Laterality: N/A;  ? CHONDROPLASTY  03/14/2012  ? Procedure: CHONDROPLASTY;  Surgeon: Carole Civil, MD;  Location: [facility];  Service: Orthopedics;  Laterality: Left;  medial condyle  ? CHONDROPLASTY Right 12/23/2012  ? Procedure: KNEE ARTHROSCOPY WITH CHONDROPLASTY WITH LIMITED DEBRIDMENT;  Surgeon: Carole Civil, MD;  Location: [facility];  Service: Orthopedics;  Laterality: Right;  ? EYE SURGERY  Cataract both 4 yrs (?)  ? FOOT SURGERY Bilateral   ? morton's neuroma  ? implantable loop recorder placement  10/31/2019  ?  Medtronic Reveal Linq model LNQ 11 ([place] [id number]) implantable loop recorder implanted by Dr Rayann Heman for cryptogenic stroke  ? KNEE ARTHROSCOPY WITH MEDIAL MENISECTOMY  03/14/2012  ? Procedure: KNEE ARTHROSCOPY WITH MEDIAL MENISECTOMY;  Surgeon: Carole Civil, MD;  Location: [facility];  Service: Orthopedics;  Laterality: Left;  ? SPINE SURGERY  Neck/plate & screws  ? TEAR DUCT PROBING Right 09/2020  ? Tear duct bypass surgery per pt.  ? ?Family History  ?Problem Relation Age of Onset  ? Hypertension Mother   ?     borderline  ? Stroke Maternal Grandmother   ? Stroke Maternal Grandfather   ? ?Social History  ? ?Socioeconomic History  ? Marital status: Widowed  ?  Spouse name: Not on file  ? Number of children: 2  ? Years of education:  12+  ? Highest education level: Not on file  ?Occupational History  ? Occupation: realtor  ?Tobacco Use  ? Smoking status: Never  ? Smokeless tobacco: Never  ?Vaping Use  ? Vaping Use: Never used  ?Substance and Sexual Activity  ? Alcohol use: Never  ? Drug use: No  ? Sexual activity: Not Currently  ?  Birth control/protection: Abstinence  ?Other Topics Concern  ? Not on file  ?Social History Narrative  ? Husband passed in 2020 from Alzheimer's   ? 2 sons-sees often   ? Several grandchildren.  ? ?Social Determinants of Health  ? ?Financial Resource Strain: Low Risk   ? Difficulty of Paying Living Expenses: Not hard at all  ?Food Insecurity: No Food Insecurity  ? Worried About Charity fundraiser in the Last Year: Never  true  ? Ran Out of Food in the Last Year: Never true  ?Transportation Needs: No Transportation Needs  ? Lack of Transportation (Medical): No  ? Lack of Transportation (Non-Medical): No  ?Physical Activity: Insufficiently Active  ? Days of Exercise per Week: 3 days  ? Minutes of Exercise per Session: 30 min  ?Stress: No Stress Concern Present  ? Feeling of Stress : Not at all  ?Social Connections: Moderately Integrated  ? Frequency of Communication with Friends and Family: More than three times a week  ? Frequency of Social Gatherings with Friends and Family: More than three times a week  ? Attends Religious Services: More than 4 times per year  ? Active Member of Clubs or Organizations: Yes  ? Attends Archivist Meetings: More than 4 times per year  ? Marital Status: Widowed  ? ? ?Tobacco Counseling ?Counseling given: Not Answered ? ? ?Clinical Intake: ? ?Pre-visit preparation completed: Yes ? ?Pain : 0-10 ?Pain Score: 4  ?Pain Type: Acute pain ?Pain Location: Back ?Pain Descriptors / Indicators: Aching ?Pain Onset: 1 to 4 weeks ago ?Pain Frequency: Intermittent ? ?  ? ?BMI - recorded: 37.69 ?Nutritional Status: BMI > 30  Obese ?Nutritional Risks: None ?Diabetes: No ? ?How often do you need  to have someone help you when you read instructions, pamphlets, or other written materials from your doctor or pharmacy?: 1 - Never ? ?Diabetic?NO ? ?Interpreter Needed?: No ? ?Information entered by :: mj

## 2021-07-15 NOTE — Patient Instructions (Addendum)
Kerry Richardson , ?Thank you for taking time to come for your Medicare Wellness Visit. I appreciate your ongoing commitment to your health goals. Please review the following plan we discussed and let me know if I can assist you in the future.  ? ?Screening recommendations/referrals: ?Colonoscopy: No longer required. ?Mammogram: Done 08/01/2020 Repeat annually ? ?Bone Density: Done 06/28/2008 Repeat every 2 years ? ?Recommended yearly ophthalmology/optometry visit for glaucoma screening and checkup ?Recommended yearly dental visit for hygiene and checkup ? ?Vaccinations: ?Influenza vaccine: Declined ?Pneumococcal vaccine: Done 12/17/2014 and 03/24/2011 ?Tdap vaccine: Done 07/16/2015 Repeat in 10 years ? ?Shingles vaccine: Discussed.    ?Covid-19:Declined. ? ?Advanced directives: Copies in chart. ? ?Conditions/risks identified: Aim for 30 minutes of exercise or brisk walking, 6-8 glasses of water, and 5 servings of fruits and vegetables each day. ? ? ?Next appointment: Follow up in one year for your annual wellness visit: 07/28/2022 @ 1:00 PM. ? ? ?Preventive Care 82 Years and Older, Female ?Preventive care refers to lifestyle choices and visits with your health care provider that can promote health and wellness. ?What does preventive care include? ?A yearly physical exam. This is also called an annual well check. ?Dental exams once or twice a year. ?Routine eye exams. Ask your health care provider how often you should have your eyes checked. ?Personal lifestyle choices, including: ?Daily care of your teeth and gums. ?Regular physical activity. ?Eating a healthy diet. ?Avoiding tobacco and drug use. ?Limiting alcohol use. ?Practicing safe sex. ?Taking low-dose aspirin every day. ?Taking vitamin and mineral supplements as recommended by your health care provider. ?What happens during an annual well check? ?The services and screenings done by your health care provider during your annual well check will depend on your age, overall  health, lifestyle risk factors, and family history of disease. ?Counseling  ?Your health care provider may ask you questions about your: ?Alcohol use. ?Tobacco use. ?Drug use. ?Emotional well-being. ?Home and relationship well-being. ?Sexual activity. ?Eating habits. ?History of falls. ?Memory and ability to understand (cognition). ?Work and work Astronomer. ?Reproductive health. ?Screening  ?You may have the following tests or measurements: ?Height, weight, and BMI. ?Blood pressure. ?Lipid and cholesterol levels. These may be checked every 5 years, or more frequently if you are over 26 years old. ?Skin check. ?Lung cancer screening. You may have this screening every year starting at age 27 if you have a 30-pack-year history of smoking and currently smoke or have quit within the past 15 years. ?Fecal occult blood test (FOBT) of the stool. You may have this test every year starting at age 44. ?Flexible sigmoidoscopy or colonoscopy. You may have a sigmoidoscopy every 5 years or a colonoscopy every 10 years starting at age 76. ?Hepatitis C blood test. ?Hepatitis B blood test. ?Sexually transmitted disease (STD) testing. ?Diabetes screening. This is done by checking your blood sugar (glucose) after you have not eaten for a while (fasting). You may have this done every 1-3 years. ?Bone density scan. This is done to screen for osteoporosis. You may have this done starting at age 57. ?Mammogram. This may be done every 1-2 years. Talk to your health care provider about how often you should have regular mammograms. ?Talk with your health care provider about your test results, treatment options, and if necessary, the need for more tests. ?Vaccines  ?Your health care provider may recommend certain vaccines, such as: ?Influenza vaccine. This is recommended every year. ?Tetanus, diphtheria, and acellular pertussis (Tdap, Td) vaccine. You may need a  Td booster every 10 years. ?Zoster vaccine. You may need this after age  82. ?Pneumococcal 13-valent conjugate (PCV13) vaccine. One dose is recommended after age 23. ?Pneumococcal polysaccharide (PPSV23) vaccine. One dose is recommended after age 82. ?Talk to your health care provider about which screenings and vaccines you need and how often you need them. ?This information is not intended to replace advice given to you by your health care provider. Make sure you discuss any questions you have with your health care provider. ?Document Released: 04/05/2015 Document Revised: 11/27/2015 Document Reviewed: 01/08/2015 ?Elsevier Interactive Patient Education ? 2017 [place]. ? ?Fall Prevention in the Home ?Falls can cause injuries. They can happen to people of all ages. There are many things you can do to make your home safe and to help prevent falls. ?What can I do on the outside of my home? ?Regularly fix the edges of walkways and driveways and fix any cracks. ?Remove anything that might make you trip as you walk through a door, such as a raised step or threshold. ?Trim any bushes or trees on the path to your home. ?Use bright outdoor lighting. ?Clear any walking paths of anything that might make someone trip, such as rocks or tools. ?Regularly check to see if handrails are loose or broken. Make sure that both sides of any steps have handrails. ?Any raised decks and porches should have guardrails on the edges. ?Have any leaves, snow, or ice cleared regularly. ?Use sand or salt on walking paths during winter. ?Clean up any spills in your garage right away. This includes oil or grease spills. ?What can I do in the bathroom? ?Use night lights. ?Install grab bars by the toilet and in the tub and shower. Do not use towel bars as grab bars. ?Use non-skid mats or decals in the tub or shower. ?If you need to sit down in the shower, use a plastic, non-slip stool. ?Keep the floor dry. Clean up any water that spills on the floor as soon as it happens. ?Remove soap buildup in the tub or shower  regularly. ?Attach bath mats securely with double-sided non-slip rug tape. ?Do not have throw rugs and other things on the floor that can make you trip. ?What can I do in the bedroom? ?Use night lights. ?Make sure that you have a light by your bed that is easy to reach. ?Do not use any sheets or blankets that are too big for your bed. They should not hang down onto the floor. ?Have a firm chair that has side arms. You can use this for support while you get dressed. ?Do not have throw rugs and other things on the floor that can make you trip. ?What can I do in the kitchen? ?Clean up any spills right away. ?Avoid walking on wet floors. ?Keep items that you use a lot in easy-to-reach places. ?If you need to reach something above you, use a strong step stool that has a grab bar. ?Keep electrical cords out of the way. ?Do not use floor polish or wax that makes floors slippery. If you must use wax, use non-skid floor wax. ?Do not have throw rugs and other things on the floor that can make you trip. ?What can I do with my stairs? ?Do not leave any items on the stairs. ?Make sure that there are handrails on both sides of the stairs and use them. Fix handrails that are broken or loose. Make sure that handrails are as long as the stairways. ?Check any  carpeting to make sure that it is firmly attached to the stairs. Fix any carpet that is loose or worn. ?Avoid having throw rugs at the top or bottom of the stairs. If you do have throw rugs, attach them to the floor with carpet tape. ?Make sure that you have a light switch at the top of the stairs and the bottom of the stairs. If you do not have them, ask someone to add them for you. ?What else can I do to help prevent falls? ?Wear shoes that: ?Do not have high heels. ?Have rubber bottoms. ?Are comfortable and fit you well. ?Are closed at the toe. Do not wear sandals. ?If you use a stepladder: ?Make sure that it is fully opened. Do not climb a closed stepladder. ?Make sure that  both sides of the stepladder are locked into place. ?Ask someone to hold it for you, if possible. ?Clearly mark and make sure that you can see: ?Any grab bars or handrails. ?First and last steps. ?Where the edge of e

## 2021-07-17 NOTE — Progress Notes (Signed)
[facility] Summary Report / Loop Recorder 

## 2021-07-18 ENCOUNTER — Encounter: Payer: Medicare Other | Admitting: Family Medicine

## 2021-07-21 ENCOUNTER — Ambulatory Visit (INDEPENDENT_AMBULATORY_CARE_PROVIDER_SITE_OTHER): Payer: Medicare Other | Admitting: Family Medicine

## 2021-07-21 VITALS — BP 155/74 | HR 76 | Temp 97.8°F | Ht 60.0 in | Wt 198.8 lb

## 2021-07-21 DIAGNOSIS — I639 Cerebral infarction, unspecified: Secondary | ICD-10-CM

## 2021-07-21 DIAGNOSIS — E7800 Pure hypercholesterolemia, unspecified: Secondary | ICD-10-CM | POA: Diagnosis not present

## 2021-07-21 DIAGNOSIS — Z0000 Encounter for general adult medical examination without abnormal findings: Secondary | ICD-10-CM | POA: Diagnosis not present

## 2021-07-21 DIAGNOSIS — I10 Essential (primary) hypertension: Secondary | ICD-10-CM

## 2021-07-21 DIAGNOSIS — R7303 Prediabetes: Secondary | ICD-10-CM

## 2021-07-21 DIAGNOSIS — Z13820 Encounter for screening for osteoporosis: Secondary | ICD-10-CM | POA: Diagnosis not present

## 2021-07-21 NOTE — Patient Instructions (Signed)
Labs ordered as well as bone density. ? ?Follow up in 6 months. ? ?Call with concerns. ? ?Take care ? ?Dr Adriana Simas  ?

## 2021-07-22 LAB — CMP14+EGFR
ALT: 15 IU/L (ref 0–32)
AST: 18 IU/L (ref 0–40)
Albumin/Globulin Ratio: 1.5 (ref 1.2–2.2)
Albumin: 4.3 g/dL (ref 3.6–4.6)
Alkaline Phosphatase: 91 IU/L (ref 44–121)
BUN/Creatinine Ratio: 19 (ref 12–28)
BUN: 11 mg/dL (ref 8–27)
Bilirubin Total: 0.2 mg/dL (ref 0.0–1.2)
CO2: 23 mmol/L (ref 20–29)
Calcium: 9.3 mg/dL (ref 8.7–10.3)
Chloride: 101 mmol/L (ref 96–106)
Creatinine, Ser: 0.59 mg/dL (ref 0.57–1.00)
Globulin, Total: 2.8 g/dL (ref 1.5–4.5)
Glucose: 111 mg/dL — ABNORMAL HIGH (ref 70–99)
Potassium: 3.9 mmol/L (ref 3.5–5.2)
Sodium: 142 mmol/L (ref 134–144)
Total Protein: 7.1 g/dL (ref 6.0–8.5)
eGFR: 90 mL/min/{1.73_m2} (ref >59)

## 2021-07-22 LAB — CBC
Hematocrit: 41.9 % (ref 34.0–46.6)
Hemoglobin: 13.6 g/dL (ref 11.1–15.9)
MCH: 29.6 pg (ref 26.6–33.0)
MCHC: 32.5 g/dL (ref 31.5–35.7)
MCV: 91 fL (ref 79–97)
Platelets: 319 10*3/uL (ref 150–450)
RBC: 4.6 x10E6/uL (ref 3.77–5.28)
RDW: 13.7 % (ref 11.7–15.4)
WBC: 9 10*3/uL (ref 3.4–10.8)

## 2021-07-22 LAB — LIPID PANEL
Chol/HDL Ratio: 3.2 ratio (ref 0.0–4.4)
Cholesterol, Total: 228 mg/dL — ABNORMAL HIGH (ref 100–199)
HDL: 71 mg/dL (ref >39)
LDL Chol Calc (NIH): 125 mg/dL — ABNORMAL HIGH (ref 0–99)
Triglycerides: 183 mg/dL — ABNORMAL HIGH (ref 0–149)
VLDL Cholesterol Cal: 32 mg/dL (ref 5–40)

## 2021-07-22 LAB — HEMOGLOBIN A1C
Est. average glucose Bld gHb Est-mCnc: 126 mg/dL
Hgb A1c MFr Bld: 6 % — ABNORMAL HIGH (ref 4.8–5.6)

## 2021-07-23 DIAGNOSIS — Z0000 Encounter for general adult medical examination without abnormal findings: Secondary | ICD-10-CM | POA: Insufficient documentation

## 2021-07-23 NOTE — Progress Notes (Signed)
? ?Subjective:  ?Patient ID: Kerry Richardson, female    DOB: [birth year]  Age: 81 y.o. MRN: [id number] ? ?CC: ?Chief Complaint  ?Patient presents with  ? Back Pain  ? last labs done 05/20/20  ? ? ?HPI: ? ?81 year old female presents for an annual physical exam. ? ?Patient is doing well.  Preventative health care is up-to-date excluding shingles vaccine.  Patient would like to wait on this at this time. Needs Dexa. ? ?Patient states that she continues to have low back pain.  Has had previous imaging with significant degenerative changes.  She does not want to pursue any other work-up or treatment at this time.  She wants to see how this is going to progress. ? ?Patient in need of labs. ? ?Patient Active Problem List  ? Diagnosis Date Noted  ? Annual physical exam 07/23/2021  ? Prediabetes 03/10/2021  ? Chronic right-sided low back pain 03/10/2021  ? Pure hypercholesterolemia   ? Cryptogenic stroke ([place]) 10/27/2019  ? Seasonal allergic rhinitis 02/22/2017  ? Essential hypertension 07/12/2013  ? Osteoarthritis, knee 12/03/2011  ? ? ?Social Hx   ?Social History  ? ?Socioeconomic History  ? Marital status: Widowed  ?  Spouse name: Not on file  ? Number of children: 2  ? Years of education: 12+  ? Highest education level: Not on file  ?Occupational History  ? Occupation: realtor  ?Tobacco Use  ? Smoking status: Never  ? Smokeless tobacco: Never  ?Vaping Use  ? Vaping Use: Never used  ?Substance and Sexual Activity  ? Alcohol use: Never  ? Drug use: No  ? Sexual activity: Not Currently  ?  Birth control/protection: Abstinence  ?Other Topics Concern  ? Not on file  ?Social History Narrative  ? Husband passed in 2020 from Alzheimer's   ? 2 sons-sees often   ? Several grandchildren.  ? ?Social Determinants of Health  ? ?Financial Resource Strain: Low Risk   ? Difficulty of Paying Living Expenses: Not hard at all  ?Food Insecurity: No Food Insecurity  ? Worried About Charity fundraiser in the Last Year: Never true  ? Ran Out of  Food in the Last Year: Never true  ?Transportation Needs: No Transportation Needs  ? Lack of Transportation (Medical): No  ? Lack of Transportation (Non-Medical): No  ?Physical Activity: Insufficiently Active  ? Days of Exercise per Week: 3 days  ? Minutes of Exercise per Session: 30 min  ?Stress: No Stress Concern Present  ? Feeling of Stress : Not at all  ?Social Connections: Moderately Integrated  ? Frequency of Communication with Friends and Family: More than three times a week  ? Frequency of Social Gatherings with Friends and Family: More than three times a week  ? Attends Religious Services: More than 4 times per year  ? Active Member of Clubs or Organizations: Yes  ? Attends Archivist Meetings: More than 4 times per year  ? Marital Status: Widowed  ? ? ?Review of Systems ?Per HPI ? ?Objective:  ?BP (!) 155/74   Pulse 76   Temp 97.8 ?F (36.6 ?C) (Oral)   Ht 5' (1.524 m)   Wt 198 lb 12.8 oz (90.2 kg)   SpO2 97%   BMI 38.83 kg/m?  ? ? ?  07/21/2021  ?  1:39 PM 07/15/2021  ?  1:05 PM 03/10/2021  ?  1:12 PM  ?BP/Weight  ?Systolic BP 174  081  ?Diastolic BP 74  71  ?Wt. (Lbs) 198.8  193 203  ?BMI 38.83 kg/m2 37.69 kg/m2 39.65 kg/m2  ? ? ?Physical Exam ?Vitals and nursing note reviewed.  ?Constitutional:   ?   General: She is not in acute distress. ?   Appearance: Normal appearance.  ?HENT:  ?   Head: Normocephalic and atraumatic.  ?   Mouth/Throat:  ?   Pharynx: Oropharynx is clear.  ?Eyes:  ?   General:     ?   Right eye: No discharge.     ?   Left eye: No discharge.  ?   Conjunctiva/sclera: Conjunctivae normal.  ?Cardiovascular:  ?   Rate and Rhythm: Normal rate and regular rhythm.  ?Pulmonary:  ?   Effort: Pulmonary effort is normal.  ?   Breath sounds: Normal breath sounds. No wheezing, rhonchi or rales.  ?Abdominal:  ?   General: There is no distension.  ?   Palpations: Abdomen is soft.  ?   Tenderness: There is no abdominal tenderness.  ?Neurological:  ?   Mental Status: She is alert.   ?Psychiatric:     ?   Mood and Affect: Mood normal.     ?   Behavior: Behavior normal.  ? ? ?Lab Results  ?Component Value Date  ? WBC 9.0 07/21/2021  ? HGB 13.6 07/21/2021  ? HCT 41.9 07/21/2021  ? PLT 319 07/21/2021  ? GLUCOSE 111 (H) 07/21/2021  ? CHOL 228 (H) 07/21/2021  ? TRIG 183 (H) 07/21/2021  ? HDL 71 07/21/2021  ? LDLCALC 125 (H) 07/21/2021  ? ALT 15 07/21/2021  ? AST 18 07/21/2021  ? NA 142 07/21/2021  ? K 3.9 07/21/2021  ? CL 101 07/21/2021  ? CREATININE 0.59 07/21/2021  ? BUN 11 07/21/2021  ? CO2 23 07/21/2021  ? TSH 1.780 06/27/2014  ? INR 0.9 10/27/2019  ? HGBA1C 6.0 (H) 07/21/2021  ? ? ? ?Assessment & Plan:  ? ?Problem List Items Addressed This Visit   ? ?  ? Cardiovascular and Mediastinum  ? Cryptogenic stroke ([place])  ? Relevant Orders  ? CBC (Completed)  ? Essential hypertension  ? Relevant Orders  ? CMP14+EGFR (Completed)  ?  ? Other  ? Annual physical exam - Primary  ?  Declines shingles vaccine. ?Needs osteoporosis screening with DEXA scan.  Ordered today. ?Remainder of preventative health care up-to-date. ? ?  ?  ? Prediabetes  ? Relevant Orders  ? Hemoglobin A1c (Completed)  ? Pure hypercholesterolemia  ? Relevant Orders  ? Lipid panel (Completed)  ? ?Other Visit Diagnoses   ? ? Screening for osteoporosis      ? Relevant Orders  ? DG Bone Density  ? ?  ? ? ?Follow-up:  Return in about 6 months (around 01/21/2022). ? ?Thersa Salt DO ?[place] ? ?

## 2021-07-23 NOTE — Assessment & Plan Note (Signed)
Declines shingles vaccine. ?Needs osteoporosis screening with DEXA scan.  Ordered today. ?Remainder of preventative health care up-to-date. ?

## 2021-08-04 ENCOUNTER — Ambulatory Visit (INDEPENDENT_AMBULATORY_CARE_PROVIDER_SITE_OTHER): Payer: Medicare Other

## 2021-08-04 DIAGNOSIS — I639 Cerebral infarction, unspecified: Secondary | ICD-10-CM

## 2021-08-06 LAB — CUP PACEART REMOTE DEVICE CHECK
Date Time Interrogation Session: 20230512231009
Implantable Pulse Generator Implant Date: 20210810

## 2021-08-13 ENCOUNTER — Ambulatory Visit (HOSPITAL_COMMUNITY)
Admission: RE | Admit: 2021-08-13 | Discharge: 2021-08-13 | Disposition: A | Discharge status: Home / Self Care | Payer: Medicare Other | Source: Ambulatory Visit | Attending: Family Medicine | Admitting: Family Medicine

## 2021-08-13 DIAGNOSIS — Z780 Asymptomatic menopausal state: Secondary | ICD-10-CM | POA: Diagnosis not present

## 2021-08-13 DIAGNOSIS — Z1231 Encounter for screening mammogram for malignant neoplasm of breast: Secondary | ICD-10-CM | POA: Insufficient documentation

## 2021-08-13 DIAGNOSIS — E58 Dietary calcium deficiency: Secondary | ICD-10-CM | POA: Insufficient documentation

## 2021-08-13 DIAGNOSIS — Z7982 Long term (current) use of aspirin: Secondary | ICD-10-CM | POA: Diagnosis not present

## 2021-08-13 DIAGNOSIS — Z13820 Encounter for screening for osteoporosis: Secondary | ICD-10-CM | POA: Insufficient documentation

## 2021-08-13 DIAGNOSIS — Z79899 Other long term (current) drug therapy: Secondary | ICD-10-CM | POA: Insufficient documentation

## 2021-08-25 NOTE — Progress Notes (Signed)
[facility] Summary Report / Loop Recorder 

## 2021-08-28 ENCOUNTER — Other Ambulatory Visit: Payer: Self-pay | Admitting: Family Medicine

## 2021-08-28 DIAGNOSIS — I10 Essential (primary) hypertension: Secondary | ICD-10-CM

## 2021-09-08 ENCOUNTER — Ambulatory Visit (INDEPENDENT_AMBULATORY_CARE_PROVIDER_SITE_OTHER): Payer: Medicare Other

## 2021-09-08 ENCOUNTER — Ambulatory Visit: Payer: Medicare Other | Admitting: Family Medicine

## 2021-09-08 DIAGNOSIS — I639 Cerebral infarction, unspecified: Secondary | ICD-10-CM | POA: Diagnosis not present

## 2021-09-10 LAB — CUP PACEART REMOTE DEVICE CHECK
Date Time Interrogation Session: 20230614232200
Implantable Pulse Generator Implant Date: 20210810

## 2021-09-26 NOTE — Progress Notes (Signed)
[facility] Summary Report / Loop Recorder 

## 2021-10-13 ENCOUNTER — Ambulatory Visit (INDEPENDENT_AMBULATORY_CARE_PROVIDER_SITE_OTHER): Payer: Medicare Other

## 2021-10-13 DIAGNOSIS — I639 Cerebral infarction, unspecified: Secondary | ICD-10-CM | POA: Diagnosis not present

## 2021-10-14 LAB — CUP PACEART REMOTE DEVICE CHECK
Date Time Interrogation Session: 20230717232021
Implantable Pulse Generator Implant Date: 20210810

## 2021-11-10 DIAGNOSIS — H401132 Primary open-angle glaucoma, bilateral, moderate stage: Secondary | ICD-10-CM | POA: Diagnosis not present

## 2021-11-10 DIAGNOSIS — H01001 Unspecified blepharitis right upper eyelid: Secondary | ICD-10-CM | POA: Diagnosis not present

## 2021-11-10 DIAGNOSIS — H04221 Epiphora due to insufficient drainage, right lacrimal gland: Secondary | ICD-10-CM | POA: Diagnosis not present

## 2021-11-10 DIAGNOSIS — H01002 Unspecified blepharitis right lower eyelid: Secondary | ICD-10-CM | POA: Diagnosis not present

## 2021-11-17 ENCOUNTER — Ambulatory Visit (INDEPENDENT_AMBULATORY_CARE_PROVIDER_SITE_OTHER): Payer: Medicare Other

## 2021-11-17 DIAGNOSIS — I639 Cerebral infarction, unspecified: Secondary | ICD-10-CM | POA: Diagnosis not present

## 2021-11-17 LAB — CUP PACEART REMOTE DEVICE CHECK
Date Time Interrogation Session: 20230819232025
Implantable Pulse Generator Implant Date: 20210810

## 2021-11-21 NOTE — Progress Notes (Signed)
[facility] Summary Report / Loop Recorder 

## 2021-12-01 ENCOUNTER — Other Ambulatory Visit: Payer: Self-pay | Admitting: Family Medicine

## 2021-12-01 ENCOUNTER — Telehealth: Payer: Self-pay | Admitting: Family Medicine

## 2021-12-01 DIAGNOSIS — I10 Essential (primary) hypertension: Secondary | ICD-10-CM

## 2021-12-01 MED ORDER — AMLODIPINE BESYLATE 5 MG PO TABS: 5.0000 mg | ORAL_TABLET | Freq: Every day | ORAL | 0 refills | Status: DC

## 2021-12-01 NOTE — Telephone Encounter (Signed)
Patient is requesting refill on amlodipine 5 mg she states took her last one this morning. [employer]

## 2021-12-09 DIAGNOSIS — H40013 Open angle with borderline findings, low risk, bilateral: Secondary | ICD-10-CM | POA: Diagnosis not present

## 2021-12-11 NOTE — Progress Notes (Signed)
[facility] Summary Report / Loop Recorder 

## 2021-12-18 LAB — CUP PACEART REMOTE DEVICE CHECK
Date Time Interrogation Session: 20230927125509
Implantable Pulse Generator Implant Date: 20210810

## 2022-01-19 ENCOUNTER — Telehealth: Payer: Self-pay

## 2022-01-19 NOTE — Telephone Encounter (Signed)
The patient states she is not seeing her reports in My chart like she used to. I explained to her the nurse and doctors are looking at the report. I am not sure why it is not crossing over to my chart like it used to. I told her the nurse will have someone look into it.

## 2022-01-21 ENCOUNTER — Ambulatory Visit: Payer: Self-pay | Admitting: Family Medicine

## 2022-01-23 NOTE — Telephone Encounter (Signed)
Spoke with patient, informed her that I was not sure why she wasn't seeing the information on her side assured patient that if there was something concerning then she would receive a call, advised patient that no news is good news from our office, Advised patient that next transmission is due on 01/26/22 patient voiced understanding

## 2022-01-27 ENCOUNTER — Ambulatory Visit (INDEPENDENT_AMBULATORY_CARE_PROVIDER_SITE_OTHER): Payer: Medicare Other | Admitting: Family Medicine

## 2022-01-27 ENCOUNTER — Encounter: Payer: Self-pay | Admitting: Family Medicine

## 2022-01-27 VITALS — BP 134/72 | HR 68 | Wt 199.6 lb

## 2022-01-27 DIAGNOSIS — G8929 Other chronic pain: Secondary | ICD-10-CM

## 2022-01-27 DIAGNOSIS — Z5320 Procedure and treatment not carried out because of patient's decision for unspecified reasons: Secondary | ICD-10-CM | POA: Diagnosis not present

## 2022-01-27 DIAGNOSIS — H9190 Unspecified hearing loss, unspecified ear: Secondary | ICD-10-CM

## 2022-01-27 DIAGNOSIS — M545 Low back pain, unspecified: Secondary | ICD-10-CM

## 2022-01-27 DIAGNOSIS — I10 Essential (primary) hypertension: Secondary | ICD-10-CM

## 2022-01-27 DIAGNOSIS — E7800 Pure hypercholesterolemia, unspecified: Secondary | ICD-10-CM

## 2022-01-27 NOTE — Assessment & Plan Note (Signed)
Well controlled. Continue Norvasc. 

## 2022-01-27 NOTE — Assessment & Plan Note (Signed)
Uncontrolled. Declines lipid lowering therapy.

## 2022-01-27 NOTE — Assessment & Plan Note (Signed)
Referring to ENT. 

## 2022-01-27 NOTE — Assessment & Plan Note (Signed)
Wants to wait on further work up and interventions at this time.

## 2022-01-27 NOTE — Patient Instructions (Signed)
Referral to ENT placed.  Continue your medications   Follow up in 6 months.

## 2022-01-27 NOTE — Progress Notes (Signed)
Subjective:  Patient ID: Kerry Richardson, female    DOB: [birth year]  Age: 81 y.o. MRN: [id number]  CC: Chief Complaint  Patient presents with   Hypertension    Pt having back issues "as usual". Having hearing issues. No s/s of HTN. Right thumbnail separating from skin; soreness at area    HPI:  81 year old female with a history of stroke, HTN, OA, Chronic low back pain, HLD, Prediabetes presents for follow up.  Patient reports that other than her chronic back pain she is doing well.  She declines further work up and treatment at this time. Wants to wait.  She does report that she is having some hearing issues/loss. She would like to see an audiologist.  Hypertension is well controlled on Norvasc.  Lipids uncontrolled as she has declined lipid lowering medication.   Declines Flu vaccine today.   Patient Active Problem List   Diagnosis Date Noted   Statin declined 01/27/2022   Hearing loss 01/27/2022   Prediabetes 03/10/2021   Chronic right-sided low back pain 03/10/2021   Pure hypercholesterolemia    Cryptogenic stroke ([place]) 10/27/2019   Seasonal allergic rhinitis 02/22/2017   Essential hypertension 07/12/2013   Osteoarthritis, knee 12/03/2011    Social Hx   Social History   Socioeconomic History   Marital status: Widowed    Spouse name: Not on file   Number of children: 2   Years of education: 12+   Highest education level: Not on file  Occupational History   Occupation: realtor  Tobacco Use   Smoking status: Never   Smokeless tobacco: Never  Vaping Use   Vaping Use: Never used  Substance and Sexual Activity   Alcohol use: Never   Drug use: No   Sexual activity: Not Currently    Birth control/protection: Abstinence  Other Topics Concern   Not on file  Social History Narrative   Husband passed in 2020 from Alzheimer's    2 sons-sees often    Several grandchildren.   Social Determinants of Health   Financial Resource Strain: Low Risk  (07/15/2021)    Overall Financial Resource Strain (CARDIA)    Difficulty of Paying Living Expenses: Not hard at all  Food Insecurity: No Food Insecurity (07/15/2021)   Hunger Vital Sign    Worried About Running Out of Food in the Last Year: Never true    Ran Out of Food in the Last Year: Never true  Transportation Needs: No Transportation Needs (07/15/2021)   PRAPARE - Hydrologist (Medical): No    Lack of Transportation (Non-Medical): No  Physical Activity: Insufficiently Active (07/15/2021)   Exercise Vital Sign    Days of Exercise per Week: 3 days    Minutes of Exercise per Session: 30 min  Stress: No Stress Concern Present (07/15/2021)   [place]    Feeling of Stress : Not at all  Social Connections: Moderately Integrated (07/15/2021)   Social Connection and Isolation Panel [NHANES]    Frequency of Communication with Friends and Family: More than three times a week    Frequency of Social Gatherings with Friends and Family: More than three times a week    Attends Religious Services: More than 4 times per year    Active Member of [employer] or Organizations: Yes    Attends Archivist Meetings: More than 4 times per year    Marital Status: Widowed    Review of  Systems  Constitutional: Negative.   Respiratory: Negative.    Cardiovascular: Negative.   Musculoskeletal:  Positive for back pain.   Objective:  BP 134/72   Pulse 68   Wt 199 lb 9.6 oz (90.5 kg)   SpO2 96%   BMI 38.98 kg/m      01/27/2022   11:03 AM 07/21/2021    1:39 PM 07/15/2021    1:05 PM  BP/Weight  Systolic BP 134 155   Diastolic BP 72 74   Wt. (Lbs) 199.6 198.8 193  BMI 38.98 kg/m2 38.83 kg/m2 37.69 kg/m2    Physical Exam Vitals and nursing note reviewed.  Constitutional:      General: She is not in acute distress.    Appearance: Normal appearance. She is obese.  HENT:     Head: Normocephalic and atraumatic.  Eyes:      General:        Right eye: No discharge.        Left eye: No discharge.     Conjunctiva/sclera: Conjunctivae normal.  Cardiovascular:     Rate and Rhythm: Normal rate and regular rhythm.  Pulmonary:     Effort: Pulmonary effort is normal.     Breath sounds: Normal breath sounds. No wheezing, rhonchi or rales.  Neurological:     Mental Status: She is alert.  Psychiatric:        Mood and Affect: Mood normal.        Behavior: Behavior normal.     Lab Results  Component Value Date   WBC 9.0 07/21/2021   HGB 13.6 07/21/2021   HCT 41.9 07/21/2021   PLT 319 07/21/2021   GLUCOSE 111 (H) 07/21/2021   CHOL 228 (H) 07/21/2021   TRIG 183 (H) 07/21/2021   HDL 71 07/21/2021   LDLCALC 125 (H) 07/21/2021   ALT 15 07/21/2021   AST 18 07/21/2021   NA 142 07/21/2021   K 3.9 07/21/2021   CL 101 07/21/2021   CREATININE 0.59 07/21/2021   BUN 11 07/21/2021   CO2 23 07/21/2021   TSH 1.780 06/27/2014   INR 0.9 10/27/2019   HGBA1C 6.0 (H) 07/21/2021     Assessment & Plan:   Problem List Items Addressed This Visit       Cardiovascular and Mediastinum   Essential hypertension    Well controlled. Continue Norvasc.        Nervous and Auditory   Hearing loss - Primary    Referring to ENT.      Relevant Orders   Ambulatory referral to ENT     Other   Chronic right-sided low back pain    Wants to wait on further work up and interventions at this time.      Pure hypercholesterolemia    Uncontrolled. Declines lipid lowering therapy.      Statin declined    Follow-up:  Return in about 6 months (around 07/28/2022).  Everlene Other DO [facility]

## 2022-02-16 ENCOUNTER — Ambulatory Visit (INDEPENDENT_AMBULATORY_CARE_PROVIDER_SITE_OTHER): Payer: Medicare Other

## 2022-02-16 ENCOUNTER — Other Ambulatory Visit: Payer: Self-pay | Admitting: Family Medicine

## 2022-02-16 DIAGNOSIS — I639 Cerebral infarction, unspecified: Secondary | ICD-10-CM

## 2022-02-17 LAB — CUP PACEART REMOTE DEVICE CHECK
Date Time Interrogation Session: 20231127110156
Implantable Pulse Generator Implant Date: 20210810
Zone Setting Status: 755011
Zone Setting Status: 755011

## 2022-02-25 ENCOUNTER — Other Ambulatory Visit: Payer: Self-pay | Admitting: Family Medicine

## 2022-02-25 DIAGNOSIS — I10 Essential (primary) hypertension: Secondary | ICD-10-CM

## 2022-03-24 ENCOUNTER — Encounter: Payer: Self-pay | Admitting: Family Medicine

## 2022-03-24 ENCOUNTER — Ambulatory Visit: Payer: BLUE CROSS/BLUE SHIELD | Attending: Internal Medicine

## 2022-03-24 ENCOUNTER — Ambulatory Visit (INDEPENDENT_AMBULATORY_CARE_PROVIDER_SITE_OTHER): Payer: BLUE CROSS/BLUE SHIELD | Admitting: Family Medicine

## 2022-03-24 VITALS — BP 136/74 | HR 75 | Temp 98.1°F | Wt 203.4 lb

## 2022-03-24 DIAGNOSIS — I639 Cerebral infarction, unspecified: Secondary | ICD-10-CM

## 2022-03-24 DIAGNOSIS — L601 Onycholysis: Secondary | ICD-10-CM | POA: Insufficient documentation

## 2022-03-24 DIAGNOSIS — L989 Disorder of the skin and subcutaneous tissue, unspecified: Secondary | ICD-10-CM | POA: Insufficient documentation

## 2022-03-24 LAB — CUP PACEART REMOTE DEVICE CHECK
Date Time Interrogation Session: 20240101231922
Implantable Pulse Generator Implant Date: 20210810

## 2022-03-24 NOTE — Progress Notes (Signed)
Subjective:  Patient ID: Kerry Richardson, female    DOB: 1940-10-16  Age: 82 y.o. MRN: [id number]  CC: Chief Complaint  Patient presents with   Thumb Nail Issue    Right thumb nail has been separating from nail bed. Area is sore. Has been using Nail Restore from [employer]. Going on for a while. Mother has same issue but only small portion of nail.     HPI:  82 year old female presents for evaluation the above.  Patient has had ongoing issue with her right thumbnail for the past few months.  She states that the thumbnail seems to be breaking away from the skin.  She is concerned about the potential for injury and the potential for infection.  No other nails are affected.  She does note some discomfort.  No reports of rash.  She has a history of skin cancer.  No other associated symptoms.  No other complaints.  Patient Active Problem List   Diagnosis Date Noted   Onycholysis 03/24/2022   Statin declined 01/27/2022   Hearing loss 01/27/2022   Prediabetes 03/10/2021   Chronic right-sided low back pain 03/10/2021   Pure hypercholesterolemia    Cryptogenic stroke ([place]) 10/27/2019   Seasonal allergic rhinitis 02/22/2017   Essential hypertension 07/12/2013   Osteoarthritis, knee 12/03/2011    Social Hx   Social History   Socioeconomic History   Marital status: Widowed    Spouse name: Not on file   Number of children: 2   Years of education: 12+   Highest education level: Not on file  Occupational History   Occupation: realtor  Tobacco Use   Smoking status: Never   Smokeless tobacco: Never  Vaping Use   Vaping Use: Never used  Substance and Sexual Activity   Alcohol use: Never   Drug use: No   Sexual activity: Not Currently    Birth control/protection: Abstinence  Other Topics Concern   Not on file  Social History Narrative   Husband passed in 2020 from Alzheimer's    2 sons-sees often    Several grandchildren.   Social Determinants of Health   Financial Resource  Strain: Low Risk  (07/15/2021)   Overall Financial Resource Strain (CARDIA)    Difficulty of Paying Living Expenses: Not hard at all  Food Insecurity: No Food Insecurity (07/15/2021)   Hunger Vital Sign    Worried About Running Out of Food in the Last Year: Never true    Ran Out of Food in the Last Year: Never true  Transportation Needs: No Transportation Needs (07/15/2021)   PRAPARE - Hydrologist (Medical): No    Lack of Transportation (Non-Medical): No  Physical Activity: Insufficiently Active (07/15/2021)   Exercise Vital Sign    Days of Exercise per Week: 3 days    Minutes of Exercise per Session: 30 min  Stress: No Stress Concern Present (07/15/2021)   [place]    Feeling of Stress : Not at all  Social Connections: Moderately Integrated (07/15/2021)   Social Connection and Isolation Panel [NHANES]    Frequency of Communication with Friends and Family: More than three times a week    Frequency of Social Gatherings with Friends and Family: More than three times a week    Attends Religious Services: More than 4 times per year    Active Member of [employer] or Organizations: Yes    Attends Archivist Meetings: More than 4 times  per year    Marital Status: Widowed    Review of Systems Per HPI  Objective:  BP 136/74   Pulse 75   Temp 98.1 F (36.7 C)   Wt 203 lb 6.4 oz (92.3 kg)   SpO2 98%   BMI 39.72 kg/m      03/24/2022   11:20 AM 01/27/2022   11:03 AM 07/21/2021    1:39 PM  BP/Weight  Systolic BP 268 341 962  Diastolic BP 74 72 74  Wt. (Lbs) 203.4 199.6 198.8  BMI 39.72 kg/m2 38.98 kg/m2 38.83 kg/m2    Physical Exam Constitutional:      Appearance: Normal appearance. She is obese.  HENT:     Head: Normocephalic and atraumatic.  Pulmonary:     Effort: Pulmonary effort is normal. No respiratory distress.  Skin:    Comments: Right thumb -White discoloration of the  distal thumbnail.  Mild onycholysis.  Neurological:     Mental Status: She is alert.     Lab Results  Component Value Date   WBC 9.0 07/21/2021   HGB 13.6 07/21/2021   HCT 41.9 07/21/2021   PLT 319 07/21/2021   GLUCOSE 111 (H) 07/21/2021   CHOL 228 (H) 07/21/2021   TRIG 183 (H) 07/21/2021   HDL 71 07/21/2021   LDLCALC 125 (H) 07/21/2021   ALT 15 07/21/2021   AST 18 07/21/2021   NA 142 07/21/2021   K 3.9 07/21/2021   CL 101 07/21/2021   CREATININE 0.59 07/21/2021   BUN 11 07/21/2021   CO2 23 07/21/2021   TSH 1.780 06/27/2014   INR 0.9 10/27/2019   HGBA1C 6.0 (H) 07/21/2021     Assessment & Plan:   Problem List Items Addressed This Visit       Musculoskeletal and Integument   Onycholysis - Primary    Etiology unclear at this time.  Possible psoriasis although no other nails are affected and she has no rash.  Possibly fungal in nature but this also does not clinically appear consistent with fungal etiology.  Referring to dermatology for further evaluation and management.      Relevant Orders   Ambulatory referral to Dermatology   [place]

## 2022-03-24 NOTE — Assessment & Plan Note (Signed)
Etiology unclear at this time.  Possible psoriasis although no other nails are affected and she has no rash.  Possibly fungal in nature but this also does not clinically appear consistent with fungal etiology.  Referring to dermatology for further evaluation and management.

## 2022-03-24 NOTE — Patient Instructions (Signed)
Referral is in.  Keep the nail short.  Take care  Dr. Lacinda Axon

## 2022-03-31 NOTE — Progress Notes (Signed)
[facility] Summary Report / Loop Recorder 

## 2022-04-07 DIAGNOSIS — K080 Exfoliation of teeth due to systemic causes: Secondary | ICD-10-CM | POA: Diagnosis not present

## 2022-04-13 DIAGNOSIS — K080 Exfoliation of teeth due to systemic causes: Secondary | ICD-10-CM | POA: Diagnosis not present

## 2022-04-24 NOTE — Progress Notes (Signed)
[facility] Summary Report / Loop Recorder 

## 2022-04-27 ENCOUNTER — Ambulatory Visit: Payer: Medicare Other

## 2022-05-07 DIAGNOSIS — H903 Sensorineural hearing loss, bilateral: Secondary | ICD-10-CM | POA: Diagnosis not present

## 2022-05-21 DIAGNOSIS — H04221 Epiphora due to insufficient drainage, right lacrimal gland: Secondary | ICD-10-CM | POA: Diagnosis not present

## 2022-05-21 DIAGNOSIS — H01002 Unspecified blepharitis right lower eyelid: Secondary | ICD-10-CM | POA: Diagnosis not present

## 2022-05-21 DIAGNOSIS — H01001 Unspecified blepharitis right upper eyelid: Secondary | ICD-10-CM | POA: Diagnosis not present

## 2022-05-21 DIAGNOSIS — H401132 Primary open-angle glaucoma, bilateral, moderate stage: Secondary | ICD-10-CM | POA: Diagnosis not present

## 2022-05-25 ENCOUNTER — Other Ambulatory Visit: Payer: Self-pay | Admitting: Family Medicine

## 2022-05-25 DIAGNOSIS — I10 Essential (primary) hypertension: Secondary | ICD-10-CM

## 2022-05-29 LAB — CUP PACEART REMOTE DEVICE CHECK
Date Time Interrogation Session: 20240307121312
Implantable Pulse Generator Implant Date: 20210810
Zone Setting Status: 755011
Zone Setting Status: 755011

## 2022-06-01 ENCOUNTER — Ambulatory Visit (INDEPENDENT_AMBULATORY_CARE_PROVIDER_SITE_OTHER): Payer: Medicare Other

## 2022-06-01 DIAGNOSIS — I639 Cerebral infarction, unspecified: Secondary | ICD-10-CM

## 2022-07-02 DIAGNOSIS — K080 Exfoliation of teeth due to systemic causes: Secondary | ICD-10-CM | POA: Diagnosis not present

## 2022-07-02 LAB — CUP PACEART REMOTE DEVICE CHECK
Date Time Interrogation Session: 20240410002428
Implantable Pulse Generator Implant Date: 20210810

## 2022-07-06 ENCOUNTER — Ambulatory Visit (INDEPENDENT_AMBULATORY_CARE_PROVIDER_SITE_OTHER): Payer: Medicare Other

## 2022-07-06 DIAGNOSIS — I639 Cerebral infarction, unspecified: Secondary | ICD-10-CM | POA: Diagnosis not present

## 2022-07-13 NOTE — Progress Notes (Signed)
[facility] Summary Report / Loop Recorder 

## 2022-07-28 ENCOUNTER — Ambulatory Visit (INDEPENDENT_AMBULATORY_CARE_PROVIDER_SITE_OTHER): Payer: Medicare Other | Admitting: Family Medicine

## 2022-07-28 ENCOUNTER — Encounter: Payer: Self-pay | Admitting: Family Medicine

## 2022-07-28 ENCOUNTER — Ambulatory Visit (HOSPITAL_COMMUNITY)
Admission: RE | Admit: 2022-07-28 | Discharge: 2022-07-28 | Disposition: A | Discharge status: Home / Self Care | Payer: Medicare Other | Source: Ambulatory Visit | Attending: Family Medicine | Admitting: Family Medicine

## 2022-07-28 VITALS — BP 145/81 | HR 70 | Temp 97.5°F | Ht 60.0 in | Wt 205.0 lb

## 2022-07-28 DIAGNOSIS — E7800 Pure hypercholesterolemia, unspecified: Secondary | ICD-10-CM

## 2022-07-28 DIAGNOSIS — R7303 Prediabetes: Secondary | ICD-10-CM | POA: Diagnosis not present

## 2022-07-28 DIAGNOSIS — M5440 Lumbago with sciatica, unspecified side: Secondary | ICD-10-CM | POA: Insufficient documentation

## 2022-07-28 DIAGNOSIS — M47816 Spondylosis without myelopathy or radiculopathy, lumbar region: Secondary | ICD-10-CM | POA: Diagnosis not present

## 2022-07-28 DIAGNOSIS — Z130 Encounter for screening for diseases of the blood and blood-forming organs and certain disorders involving the immune mechanism: Secondary | ICD-10-CM

## 2022-07-28 DIAGNOSIS — I10 Essential (primary) hypertension: Secondary | ICD-10-CM | POA: Diagnosis not present

## 2022-07-28 DIAGNOSIS — M545 Low back pain, unspecified: Secondary | ICD-10-CM | POA: Diagnosis not present

## 2022-07-28 DIAGNOSIS — G8929 Other chronic pain: Secondary | ICD-10-CM | POA: Insufficient documentation

## 2022-07-28 DIAGNOSIS — M4316 Spondylolisthesis, lumbar region: Secondary | ICD-10-CM | POA: Diagnosis not present

## 2022-07-28 NOTE — Patient Instructions (Signed)
Labs today.  Xray today.  Continue your current medications.  Follow up in 3 months.

## 2022-07-28 NOTE — Assessment & Plan Note (Signed)
Fair control.  Continue amlodipine.  Labs today.

## 2022-07-28 NOTE — Assessment & Plan Note (Signed)
X-ray for further evaluation. 

## 2022-07-28 NOTE — Progress Notes (Signed)
Subjective:  Patient ID: Kerry Richardson, female    DOB: [birth year]  Age: 82 y.o. MRN: [id number]  CC: Chief Complaint  Patient presents with   Back Pain    Right leg pain, multiple times had near falls due to pain that hits her low back and legs - if not held on would have fallen     HPI:  82 year old female with cryptogenic stroke, hypertension, osteoarthritis, prediabetes, chronic low back pain, hyperlipidemia presents for follow-up.  Patient's blood pressure is mildly elevated today.  She has not taken her medication yet today.  Patient reports that she has been having ongoing back pain.  This is a chronic issue.  Recently worsening as she has had multiple bouts where she has had debilitating pain which lasts briefly and then resolves.  She states that the pain has went down her right leg.  This is quite concerning to her.  Patient is in need of labs.  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   Pure hypercholesterolemia    Cryptogenic stroke ([facility]) 10/27/2019   Seasonal allergic rhinitis 02/22/2017   Essential hypertension 07/12/2013   Osteoarthritis, knee 12/03/2011    Social Hx   Social History   Socioeconomic History   Marital status: Widowed    Spouse name: Not on file   Number of children: 2   Years of education: 12+   Highest education level: Associate degree: academic program  Occupational History   Occupation: Veterinary surgeon  Tobacco Use   Smoking status: Never   Smokeless tobacco: Never  Vaping Use   Vaping Use: Never used  Substance and Sexual Activity   Alcohol use: Never   Drug use: No   Sexual activity: Not Currently    Birth control/protection: Abstinence  Other Topics Concern   Not on file  Social History Narrative   Husband passed in 2020 from Alzheimer's    2 sons-sees often    Several grandchildren.   Social Determinants of  Health   Financial Resource Strain: Low Risk  (07/24/2022)   Overall Financial Resource Strain (CARDIA)    Difficulty of Paying Living Expenses: Not hard at all  Food Insecurity: No Food Insecurity (07/24/2022)   Hunger Vital Sign    Worried About Running Out of Food in the Last Year: Never true    Ran Out of Food in the Last Year: Never true  Transportation Needs: No Transportation Needs (07/24/2022)   PRAPARE - Administrator, Civil Service (Medical): No    Lack of Transportation (Non-Medical): No  Physical Activity: Unknown (07/24/2022)   Exercise Vital Sign    Days of Exercise per Week: 0 days    Minutes of Exercise per Session: Not on file  Stress: No Stress Concern Present (07/24/2022)   [employer] of Occupational Health - Occupational Stress Questionnaire    Feeling of Stress : Not at all  Social Connections: Moderately Integrated (07/24/2022)   Social Connection and Isolation Panel [NHANES]    Frequency of Communication with Friends and Family: More than three times a week    Frequency of Social Gatherings with Friends and Family: Twice a week    Attends Religious Services: More than 4 times per year    Active Member of [employer] or Organizations: Yes    Attends Banker Meetings: More than 4 times per year  Marital Status: Widowed    Review of Systems Per HPI  Objective:  BP (!) 145/81   Pulse 70   Temp (!) 97.5 F (36.4 C)   Ht 5' (1.524 m)   Wt 205 lb (93 kg)   SpO2 95%   BMI 40.04 kg/m      07/28/2022   10:56 AM 07/28/2022   10:41 AM 03/24/2022   11:20 AM  BP/Weight  Systolic BP 145 144 136  Diastolic BP 81 78 74  Wt. (Lbs)  205 203.4  BMI  40.04 kg/m2 39.72 kg/m2    Physical Exam Constitutional:      General: She is not in acute distress.    Appearance: Normal appearance.  HENT:     Head: Normocephalic and atraumatic.  Eyes:     General:        Right eye: No discharge.        Left eye: No discharge.     Conjunctiva/sclera:  Conjunctivae normal.  Cardiovascular:     Rate and Rhythm: Normal rate and regular rhythm.  Pulmonary:     Effort: Pulmonary effort is normal.     Breath sounds: Normal breath sounds. No wheezing, rhonchi or rales.  Musculoskeletal:     Comments: Lumbar spine nontender to palpation.  Neurological:     Mental Status: She is alert.  Psychiatric:        Mood and Affect: Mood normal.        Behavior: Behavior normal.     Lab Results  Component Value Date   WBC 9.0 07/21/2021   HGB 13.6 07/21/2021   HCT 41.9 07/21/2021   PLT 319 07/21/2021   GLUCOSE 111 (H) 07/21/2021   CHOL 228 (H) 07/21/2021   TRIG 183 (H) 07/21/2021   HDL 71 07/21/2021   LDLCALC 125 (H) 07/21/2021   ALT 15 07/21/2021   AST 18 07/21/2021   NA 142 07/21/2021   K 3.9 07/21/2021   CL 101 07/21/2021   CREATININE 0.59 07/21/2021   BUN 11 07/21/2021   CO2 23 07/21/2021   TSH 1.780 06/27/2014   INR 0.9 10/27/2019   HGBA1C 6.0 (H) 07/21/2021     Assessment & Plan:   Problem List Items Addressed This Visit       Cardiovascular and Mediastinum   Essential hypertension - Primary    Fair control.  Continue amlodipine.  Labs today.      Relevant Orders   CMP14+EGFR     Nervous and Auditory   Chronic bilateral low back pain with sciatica    X-ray for further evaluation.      Relevant Orders   DG Lumbar Spine Complete (Completed)     Other   Prediabetes   Relevant Orders   Hemoglobin A1c   Pure hypercholesterolemia   Relevant Orders   Lipid panel   Other Visit Diagnoses     Screening for deficiency anemia       Relevant Orders   CBC       Follow-up:  Return in about 3 months (around 10/28/2022).  Everlene Other DO [facility]

## 2022-07-29 LAB — CMP14+EGFR
ALT: 13 IU/L (ref 0–32)
AST: 15 IU/L (ref 0–40)
Albumin/Globulin Ratio: 1.5 (ref 1.2–2.2)
Albumin: 4.3 g/dL (ref 3.7–4.7)
Alkaline Phosphatase: 93 IU/L (ref 44–121)
BUN/Creatinine Ratio: 17 (ref 12–28)
BUN: 13 mg/dL (ref 8–27)
Bilirubin Total: 0.3 mg/dL (ref 0.0–1.2)
CO2: 25 mmol/L (ref 20–29)
Calcium: 9.6 mg/dL (ref 8.7–10.3)
Chloride: 101 mmol/L (ref 96–106)
Creatinine, Ser: 0.75 mg/dL (ref 0.57–1.00)
Globulin, Total: 2.8 g/dL (ref 1.5–4.5)
Glucose: 95 mg/dL (ref 70–99)
Potassium: 4.4 mmol/L (ref 3.5–5.2)
Sodium: 141 mmol/L (ref 134–144)
Total Protein: 7.1 g/dL (ref 6.0–8.5)
eGFR: 79 mL/min/{1.73_m2} (ref >59)

## 2022-07-29 LAB — CBC
Hematocrit: 43.7 % (ref 34.0–46.6)
Hemoglobin: 14 g/dL (ref 11.1–15.9)
MCH: 29.4 pg (ref 26.6–33.0)
MCHC: 32 g/dL (ref 31.5–35.7)
MCV: 92 fL (ref 79–97)
Platelets: 354 10*3/uL (ref 150–450)
RBC: 4.76 x10E6/uL (ref 3.77–5.28)
RDW: 13.7 % (ref 11.7–15.4)
WBC: 8.8 10*3/uL (ref 3.4–10.8)

## 2022-07-29 LAB — LIPID PANEL
Chol/HDL Ratio: 3.3 ratio (ref 0.0–4.4)
Cholesterol, Total: 258 mg/dL — ABNORMAL HIGH (ref 100–199)
HDL: 79 mg/dL (ref >39)
LDL Chol Calc (NIH): 161 mg/dL — ABNORMAL HIGH (ref 0–99)
Triglycerides: 106 mg/dL (ref 0–149)
VLDL Cholesterol Cal: 18 mg/dL (ref 5–40)

## 2022-07-29 LAB — HEMOGLOBIN A1C
Est. average glucose Bld gHb Est-mCnc: 131 mg/dL
Hgb A1c MFr Bld: 6.2 % — ABNORMAL HIGH (ref 4.8–5.6)

## 2022-07-31 ENCOUNTER — Ambulatory Visit (INDEPENDENT_AMBULATORY_CARE_PROVIDER_SITE_OTHER): Payer: Medicare Other

## 2022-07-31 VITALS — BP 145/81 | Ht 60.0 in | Wt 205.0 lb

## 2022-07-31 DIAGNOSIS — Z0000 Encounter for general adult medical examination without abnormal findings: Secondary | ICD-10-CM | POA: Diagnosis not present

## 2022-07-31 DIAGNOSIS — Z1231 Encounter for screening mammogram for malignant neoplasm of breast: Secondary | ICD-10-CM

## 2022-07-31 NOTE — Progress Notes (Signed)
 I connected with  Vertell Limber on 07/31/22 by a audio enabled telemedicine application and verified that I am speaking with the correct person using two identifiers.  Patient Location: Home  Provider Location: Home Office  I discussed the limitations of evaluation and management by telemedicine. The patient expressed understanding and agreed to proceed.  Subjective:   Kerry Richardson is a 82 y.o. female who presents for Medicare Annual (Subsequent) preventive examination.  Review of Systems     Cardiac Risk Factors include: advanced age (>31men, >57 women);hypertension;obesity (BMI >30kg/m2);sedentary lifestyle     Objective:    Today's Vitals   07/31/22 1348  BP: (!) 145/81  Weight: 205 lb (93 kg)  Height: 5' (1.524 m)   Body mass index is 40.04 kg/m.     07/31/2022    1:56 PM 07/15/2021    1:19 PM 07/09/2020    1:56 PM 10/27/2019    9:00 PM 08/02/2017   10:45 AM 07/26/2017    1:14 PM 10/01/2014   10:06 AM  Advanced Directives  Does Patient Have a Medical Advance Directive? Yes Yes Yes Yes Yes Yes Yes  Type of Estate agent of [place];Living will Healthcare Power of [place];Living will Healthcare Power of [place];Living will Healthcare Power of [place];Living will Healthcare Power of [employer] Power of [employer] Power of [place];Living will  Does patient want to make changes to medical advance directive? No - Patient declined  No - Patient declined No - Patient declined     Copy of Healthcare Power of Attorney in Chart? Yes - validated most recent copy scanned in chart (See row information) Yes - validated most recent copy scanned in chart (See row information) Yes - validated most recent copy scanned in chart (See row information)  Yes Yes Yes    Current Medications (verified) Outpatient Encounter Medications as of 07/31/2022  Medication Sig   amLODipine (NORVASC) 5 MG tablet TAKE 1 TABLET BY MOUTH ONCE DAILY.   Ascorbic Acid  (VITAMIN C) 250 MG CHEW    aspirin EC 81 MG EC tablet Take 1 tablet (81 mg total) by mouth daily. Swallow whole.   Cholecalciferol (VITAMIN D) 125 MCG (5000 UT) CAPS Take 5,000 Units by mouth daily.    fluorouracil (EFUDEX) 5 % cream Apply topically 2 (two) times daily.   ketoconazole (NIZORAL) 2 % cream APPLY TO AFFECTED AREA TWICE DAILY.   NIACINAMIDE PO Take 1 tablet by mouth daily.   OVER THE COUNTER MEDICATION Take 2 capsules by mouth daily. [place] Mushroom   triamcinolone cream (KENALOG) 0.1 % Apply 1 application topically 2 (two) times daily as needed (itching).    Zinc 50 MG TABS Take 50 mg by mouth daily.   No facility-administered encounter medications on file as of 07/31/2022.    Allergies (verified) Codeine   History: Past Medical History:  Diagnosis Date   Allergy    chronic   Alpha galactosidase deficiency    Arthritis    Back pain    Bulging discs    Bursitis    Cataract Several years ago   Removed   DDD (degenerative disc disease)    Dyspnea    with exertion   GERD (gastroesophageal reflux disease)    mild   Glaucoma    H/O removal of cyst    finger (uncertain of which finger)   Hypertension 2021   Migraine headache    Neck pain    PONV (postoperative nausea and vomiting)  Reflux    Seasonal allergies    Stroke (HCC) 10/22/2019   Past Surgical History:  Procedure Laterality Date   ABDOMINAL HYSTERECTOMY     CARPAL TUNNEL RELEASE     bilateral   CATARACT EXTRACTION W/PHACO Left 10/01/2014   Procedure: CATARACT EXTRACTION PHACO AND INTRAOCULAR LENS PLACEMENT LEFT EYE;  Surgeon: Gemma Payor, MD;  Location: [facility];  Service: Ophthalmology;  Laterality: Left;  CDE:8.15   CATARACT EXTRACTION W/PHACO Right 08/02/2017   Procedure: CATARACT EXTRACTION WITH PHACOEMULSIFICATION  AND INTRAOCULAR LENS PLACEMENT AND PLACEMENT OF iSTENT GLAUCOMA DEVICE RIGHT EYE;  Surgeon: Gemma Payor, MD;  Location: [facility];  Service: Ophthalmology;  Laterality: Right;  CDE:  7.77   CERVICAL FUSION     CHOLECYSTECTOMY N/A 05/15/2013   Procedure: LAPAROSCOPIC CHOLECYSTECTOMY WITH INTRAOPERATIVE CHOLANGIOGRAM;  Surgeon: Dalia Heading, MD;  Location: [facility];  Service: General;  Laterality: N/A;   CHONDROPLASTY  03/14/2012   Procedure: CHONDROPLASTY;  Surgeon: Vickki Hearing, MD;  Location: [facility];  Service: Orthopedics;  Laterality: Left;  medial condyle   CHONDROPLASTY Right 12/23/2012   Procedure: KNEE ARTHROSCOPY WITH CHONDROPLASTY WITH LIMITED DEBRIDMENT;  Surgeon: Vickki Hearing, MD;  Location: [facility];  Service: Orthopedics;  Laterality: Right;   EYE SURGERY  Cataract both 4 yrs (?)   FOOT SURGERY Bilateral    morton's neuroma   implantable loop recorder placement  10/31/2019    Medtronic Reveal Linq model LNQ 11 (SN [id number]) implantable loop recorder 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   Family History  Problem Relation Age of Onset   Hypertension Mother        borderline   Stroke Maternal Grandmother    Stroke Maternal Grandfather    Social History   Socioeconomic History   Marital status: Widowed    Spouse name: Not on file   Number of children: 2   Years of education: 12+   Highest education level: Associate degree: academic program  Occupational History   Occupation: Veterinary surgeon  Tobacco Use   Smoking status: Never   Smokeless tobacco: Never  Vaping Use   Vaping Use: Never used  Substance and Sexual Activity   Alcohol use: Never   Drug use: No   Sexual activity: Not Currently    Birth control/protection: Abstinence  Other Topics Concern   Not on file  Social History Narrative   Husband passed in 2020 from  Alzheimer's    2 sons-sees often    Several grandchildren.   Social Determinants of Health   Financial Resource Strain: Low Risk  (07/24/2022)   Overall Financial Resource Strain (CARDIA)    Difficulty of Paying Living Expenses: Not hard at all  Food Insecurity: No Food Insecurity (07/24/2022)   Hunger Vital Sign    Worried About Running Out of Food in the Last Year: Never true    Ran Out of Food in the Last Year: Never true  Transportation Needs: No Transportation Needs (07/31/2022)   PRAPARE - Administrator, Civil Service (Medical): No    Lack of Transportation (Non-Medical): No  Physical Activity: Inactive (07/31/2022)   Exercise Vital Sign    Days of  Exercise per Week: 0 days    Minutes of Exercise per Session: 0 min  Stress: No Stress Concern Present (07/31/2022)   [employer] of Occupational Health - Occupational Stress Questionnaire    Feeling of Stress : Not at all  Social Connections: Socially Integrated (07/31/2022)   Social Connection and Isolation Panel [NHANES]    Frequency of Communication with Friends and Family: More than three times a week    Frequency of Social Gatherings with Friends and Family: More than three times a week    Attends Religious Services: More than 4 times per year    Active Member of [employer] or Organizations: Yes    Attends Engineer, structural: More than 4 times per year    Marital Status: Married    Tobacco Counseling Counseling given: Yes   Clinical Intake:  Pre-visit preparation completed: Yes  Pain : No/denies pain     BMI - recorded: 40.04 Nutritional Risks: None Diabetes: No  How often do you need to have someone help you when you read instructions, pamphlets, or other written materials from your doctor or pharmacy?: (P) 1 - Never  Diabetic?N/A Information entered by ::  Mikya Don, CMA   Activities of Daily Living    07/31/2022    2:10 PM 07/31/2022    9:46 AM  In your present state of health, do  you have any difficulty performing the following activities:  Hearing? 0 0  Vision? 0 0  Difficulty concentrating or making decisions? 0 0  Walking or climbing stairs? 0 0  Dressing or bathing? 0 0  Doing errands, shopping? 0 0  Preparing Food and eating ? N N  Using the Toilet? N N  In the past six months, have you accidently leaked urine? N N  Do you have problems with loss of bowel control? N N  Managing your Medications? N N  Managing your Finances? N N  Housekeeping or managing your Housekeeping? N N    Patient Care Team: Tommie Sams, DO as PCP - General (Family Medicine) Hillis Range, MD (Inactive) as PCP - Cardiology (Cardiology)  Indicate any recent Medical Services you may have received from other than [facility] providers in the past year (date may be approximate).     Assessment:   This is a routine wellness examination for Lillar.  Hearing/Vision screen Hearing Screening - Comments:: Wears hearing aids Vision Screening - Comments:: Wears glasses only for reading   Dietary issues and exercise activities discussed: Current Exercise Habits: The patient does not participate in regular exercise at present (due to chronic low back pain), Exercise limited by: orthopedic condition(s) (chronic low back pain)   Goals Addressed             This Visit's Progress    Patient Stated       Patient stated her goal is to be as active as she can be every day       Depression Screen    07/31/2022    1:52 PM 07/28/2022   10:58 AM 07/15/2021    1:13 PM 03/10/2021    1:10 PM 07/09/2020    1:59 PM 05/20/2020    1:09 PM 05/03/2019    1:59 PM  PHQ 2/9 Scores  PHQ - 2 Score 0 0 0 0 0 0 0  PHQ- 9 Score 0 0         Fall Risk    07/31/2022    2:12 PM 07/31/2022    9:46 AM 07/28/2022  10:58 AM 07/15/2021    1:20 PM 07/14/2021    3:04 PM  Fall Risk   Falls in the past year? 0 0 0 0 0  Number falls in past yr: 0 0 0 0   Injury with Fall? 0 0 0 0   Risk for fall due to : No Fall  Risks No Fall Risks No Fall Risks No Fall Risks   Follow up Falls prevention discussed Falls prevention discussed Falls evaluation completed Falls prevention discussed     FALL RISK PREVENTION PERTAINING TO THE HOME:  Any stairs in or around the home? Yes  If so, are there any without handrails? No  Home free of loose throw rugs in walkways, pet beds, electrical cords, etc? Yes  Adequate lighting in your home to reduce risk of falls? Yes   ASSISTIVE DEVICES UTILIZED TO PREVENT FALLS:  Life alert? No  Use of a cane, walker or w/c? No  Grab bars in the bathroom? No  Shower chair or bench in shower? No  Elevated toilet seat or a handicapped toilet? No   TIMED UP AND GO:  Was the test performed? No . Telephone visit    Cognitive Function:        07/31/2022    1:59 PM 07/15/2021    1:24 PM  6CIT Screen  What Year? 0 points 0 points  What month? 0 points 0 points  What time? 0 points 0 points  Count back from 20 0 points 0 points  Months in reverse 0 points 0 points  Repeat phrase 0 points 0 points  Total Score 0 points 0 points    Immunizations Immunization History  Administered Date(s) Administered   Influenza Whole 12/19/2007   Influenza,inj,Quad PF,6+ Mos 12/18/2013, 12/17/2014, 03/09/2016   Influenza-Unspecified 01/17/2013   Pneumococcal Conjugate-13 12/17/2014   Pneumococcal Polysaccharide-23 03/24/2011   Td 07/05/1998, 07/16/2015   Zoster, Live 06/26/2008    TDAP status: Up to date  Flu Vaccine status: Up to date  Pneumococcal vaccine status: Up to date  Covid-19 vaccine status: Completed vaccines  Qualifies for Shingles Vaccine? Yes   Zostavax completed Yes   Shingrix Completed?: No.    Education has been provided regarding the importance of this vaccine. Patient has been advised to call insurance company to determine out of pocket expense if they have not yet received this vaccine. Advised may also receive vaccine at local pharmacy or Health Dept.  Verbalized acceptance and understanding.  Screening Tests Health Maintenance  Topic Date Due   Zoster Vaccines- Shingrix (1 of 2) Never done   INFLUENZA VACCINE  10/22/2022   Medicare Annual Wellness (AWV)  07/31/2023   DTaP/Tdap/Td (3 - Tdap) 07/15/2025   Pneumonia Vaccine 33+ Years old  Completed   DEXA SCAN  Completed   HPV VACCINES  Aged Out   COVID-19 Vaccine  Discontinued    Health Maintenance  Health Maintenance Due  Topic Date Due   Zoster Vaccines- Shingrix (1 of 2) Never done    Colorectal cancer screening: No longer required.   Mammogram status: Ordered 07/31/22. Pt provided with contact info and advised to call to schedule appt.   Bone Density status: Completed 08/13/21. Results reflect: Bone density results: NORMAL. Repeat every 2 years.  Lung Cancer Screening: (Low Dose CT Chest recommended if Age 44-80 years, 30 pack-year currently smoking OR have quit w/in 15years.) does not qualify.     Additional Screening:  Hepatitis C Screening: does not qualify; Completed   Vision Screening:  Recommended annual ophthalmology exams for early detection of glaucoma and other disorders of the eye. Is the patient up to date with their annual eye exam?  Yes  Who is the provider or what is the name of the office in which the patient attends annual eye exams?  If pt is not established with a provider, would they like to be referred to a provider to establish care? No .   Dental Screening: Recommended annual dental exams for proper oral hygiene  Community Resource Referral / Chronic Care Management: CRR required this visit?  No   CCM required this visit?  No      Plan:     I have personally reviewed and noted the following in the patient's chart:   Medical and social history Use of alcohol, tobacco or illicit drugs  Current medications and supplements including opioid prescriptions. Patient is not currently taking opioid prescriptions. Functional ability and  status Nutritional status Physical activity Advanced directives List of other physicians Hospitalizations, surgeries, and ER visits in previous 12 months Vitals Screenings to include cognitive, depression, and falls Referrals and appointments  In addition, I have reviewed and discussed with patient certain preventive protocols, quality metrics, and best practice recommendations. A written personalized care plan for preventive services as well as general preventive health recommendations were provided to patient.     Jordan Hawks Georgeanne Frankland, CMA   07/31/2022   Nurse Notes: Patient declines colonoscopy

## 2022-07-31 NOTE — Patient Instructions (Signed)
Kerry Richardson , Thank you for taking time to come for your Medicare Wellness Visit. I appreciate your ongoing commitment to your health goals. Please review the following plan we discussed and let me know if I can assist you in the future.   These are the goals we discussed:  Goals      Exercise 3x per week (30 min per time)     Patient Stated     Patient stated her goal is to be as active as she can be every day        This is a list of the screening recommended for you and due dates:  Health Maintenance  Topic Date Due   Zoster (Shingles) Vaccine (1 of 2) Never done   Flu Shot  10/22/2022   Medicare Annual Wellness Visit  07/31/2023   DTaP/Tdap/Td vaccine (3 - Tdap) 07/15/2025   Pneumonia Vaccine  Completed   DEXA scan (bone density measurement)  Completed   HPV Vaccine  Aged Out   COVID-19 Vaccine  Discontinued    Advanced directives: Copy is in patients chart    Conditions/risks identified:   Next appointment: Follow up in one year for your annual wellness visit 08/06/2023 at 1:30pm   Preventive Care 65 Years and Older, Female Preventive care refers to lifestyle choices and visits with your health care provider that can promote health and wellness. What does preventive care include? A yearly physical exam. This is also called an annual well check. Dental exams once or twice a year. Routine eye exams. Ask your health care provider how often you should have your eyes checked. Personal lifestyle choices, including: Daily care of your teeth and gums. Regular physical activity. Eating a healthy diet. Avoiding tobacco and drug use. Limiting alcohol use. Practicing safe sex. Taking low-dose aspirin every day. Taking vitamin and mineral supplements as recommended by your health care provider. What happens during an annual well check? The services and screenings done by your health care provider during your annual well check will depend on your age, overall health, lifestyle  risk factors, and family history of disease. Counseling  Your health care provider may ask you questions about your: Alcohol use. Tobacco use. Drug use. Emotional well-being. Home and relationship well-being. Sexual activity. Eating habits. History of falls. Memory and ability to understand (cognition). Work and work Astronomer. Reproductive health. Screening  You may have the following tests or measurements: Height, weight, and BMI. Blood pressure. Lipid and cholesterol levels. These may be checked every 5 years, or more frequently if you are over 41 years old. Skin check. Lung cancer screening. You may have this screening every year starting at age 26 if you have a 30-pack-year history of smoking and currently smoke or have quit within the past 15 years. Fecal occult blood test (FOBT) of the stool. You may have this test every year starting at age 8. Flexible sigmoidoscopy or colonoscopy. You may have a sigmoidoscopy every 5 years or a colonoscopy every 10 years starting at age 79. Hepatitis C blood test. Hepatitis B blood test. Sexually transmitted disease (STD) testing. Diabetes screening. This is done by checking your blood sugar (glucose) after you have not eaten for a while (fasting). You may have this done every 1-3 years. Bone density scan. This is done to screen for osteoporosis. You may have this done starting at age 65. Mammogram. This may be done every 1-2 years. Talk to your health care provider about how often you should have regular mammograms.  Talk with your health care provider about your test results, treatment options, and if necessary, the need for more tests. Vaccines  Your health care provider may recommend certain vaccines, such as: Influenza vaccine. This is recommended every year. Tetanus, diphtheria, and acellular pertussis (Tdap, Td) vaccine. You may need a Td booster every 10 years. Zoster vaccine. You may need this after age 79. Pneumococcal  13-valent conjugate (PCV13) vaccine. One dose is recommended after age 78. Pneumococcal polysaccharide (PPSV23) vaccine. One dose is recommended after age 7. Talk to your health care provider about which screenings and vaccines you need and how often you need them. This information is not intended to replace advice given to you by your health care provider. Make sure you discuss any questions you have with your health care provider. Document Released: 04/05/2015 Document Revised: 11/27/2015 Document Reviewed: 01/08/2015 Elsevier Interactive Patient Education  2017 [employer].  Fall Prevention in the Home Falls can cause injuries. They can happen to people of all ages. There are many things you can do to make your home safe and to help prevent falls. What can I do on the outside of my home? Regularly fix the edges of walkways and driveways and fix any cracks. Remove anything that might make you trip as you walk through a door, such as a raised step or threshold. Trim any bushes or trees on the path to your home. Use bright outdoor lighting. Clear any walking paths of anything that might make someone trip, such as rocks or tools. Regularly check to see if handrails are loose or broken. Make sure that both sides of any steps have handrails. Any raised decks and porches should have guardrails on the edges. Have any leaves, snow, or ice cleared regularly. Use sand or salt on walking paths during winter. Clean up any spills in your garage right away. This includes oil or grease spills. What can I do in the bathroom? Use night lights. Install grab bars by the toilet and in the tub and shower. Do not use towel bars as grab bars. Use non-skid mats or decals in the tub or shower. If you need to sit down in the shower, use a plastic, non-slip stool. Keep the floor dry. Clean up any water that spills on the floor as soon as it happens. Remove soap buildup in the tub or shower regularly. Attach  bath mats securely with double-sided non-slip rug tape. Do not have throw rugs and other things on the floor that can make you trip. What can I do in the bedroom? Use night lights. Make sure that you have a light by your bed that is easy to reach. Do not use any sheets or blankets that are too big for your bed. They should not hang down onto the floor. Have a firm chair that has side arms. You can use this for support while you get dressed. Do not have throw rugs and other things on the floor that can make you trip. What can I do in the kitchen? Clean up any spills right away. Avoid walking on wet floors. Keep items that you use a lot in easy-to-reach places. If you need to reach something above you, use a strong step stool that has a grab bar. Keep electrical cords out of the way. Do not use floor polish or wax that makes floors slippery. If you must use wax, use non-skid floor wax. Do not have throw rugs and other things on the floor that can make  you trip. What can I do with my stairs? Do not leave any items on the stairs. Make sure that there are handrails on both sides of the stairs and use them. Fix handrails that are broken or loose. Make sure that handrails are as long as the stairways. Check any carpeting to make sure that it is firmly attached to the stairs. Fix any carpet that is loose or worn. Avoid having throw rugs at the top or bottom of the stairs. If you do have throw rugs, attach them to the floor with carpet tape. Make sure that you have a light switch at the top of the stairs and the bottom of the stairs. If you do not have them, ask someone to add them for you. What else can I do to help prevent falls? Wear shoes that: Do not have high heels. Have rubber bottoms. Are comfortable and fit you well. Are closed at the toe. Do not wear sandals. If you use a stepladder: Make sure that it is fully opened. Do not climb a closed stepladder. Make sure that both sides of the  stepladder are locked into place. Ask someone to hold it for you, if possible. Clearly mark and make sure that you can see: Any grab bars or handrails. First and last steps. Where the edge of each step is. Use tools that help you move around (mobility aids) if they are needed. These include: Canes. Walkers. Scooters. Crutches. Turn on the lights when you go into a dark area. Replace any light bulbs as soon as they burn out. Set up your furniture so you have a clear path. Avoid moving your furniture around. If any of your floors are uneven, fix them. If there are any pets around you, be aware of where they are. Review your medicines with your doctor. Some medicines can make you feel dizzy. This can increase your chance of falling. Ask your doctor what other things that you can do to help prevent falls. This information is not intended to replace advice given to you by your health care provider. Make sure you discuss any questions you have with your health care provider. Document Released: 01/03/2009 Document Revised: 08/15/2015 Document Reviewed: 04/13/2014 Elsevier Interactive Patient Education  2017 [employer].

## 2022-08-03 ENCOUNTER — Encounter: Payer: Self-pay | Admitting: *Deleted

## 2022-08-03 NOTE — Addendum Note (Signed)
Addended by: Margaretha Sheffield on: 08/03/2022 10:08 AM   Modules accepted: Orders

## 2022-08-05 LAB — CUP PACEART REMOTE DEVICE CHECK
Date Time Interrogation Session: 20240513002733
Implantable Pulse Generator Implant Date: 20210810

## 2022-08-06 ENCOUNTER — Telehealth: Payer: Self-pay

## 2022-08-06 NOTE — Telephone Encounter (Signed)
Pt wanting a neurosurgeon in [place]

## 2022-08-10 ENCOUNTER — Ambulatory Visit (INDEPENDENT_AMBULATORY_CARE_PROVIDER_SITE_OTHER): Payer: Medicare Other

## 2022-08-10 DIAGNOSIS — I639 Cerebral infarction, unspecified: Secondary | ICD-10-CM | POA: Diagnosis not present

## 2022-08-12 NOTE — Progress Notes (Signed)
[facility] Summary Report / Loop Recorder 

## 2022-08-18 ENCOUNTER — Other Ambulatory Visit: Payer: Self-pay | Admitting: Family Medicine

## 2022-08-18 DIAGNOSIS — I10 Essential (primary) hypertension: Secondary | ICD-10-CM

## 2022-09-04 ENCOUNTER — Ambulatory Visit (INDEPENDENT_AMBULATORY_CARE_PROVIDER_SITE_OTHER): Payer: Medicare Other

## 2022-09-04 DIAGNOSIS — I639 Cerebral infarction, unspecified: Secondary | ICD-10-CM | POA: Diagnosis not present

## 2022-09-07 LAB — CUP PACEART REMOTE DEVICE CHECK
Date Time Interrogation Session: 20240615003015
Implantable Pulse Generator Implant Date: 20210810

## 2022-09-08 NOTE — Progress Notes (Signed)
[facility] Summary Report / Loop Recorder 

## 2022-09-14 ENCOUNTER — Ambulatory Visit: Payer: Medicare Other

## 2022-09-22 NOTE — Progress Notes (Signed)
[facility] Summary Report / Loop Recorder 

## 2022-10-07 ENCOUNTER — Ambulatory Visit (INDEPENDENT_AMBULATORY_CARE_PROVIDER_SITE_OTHER): Payer: Medicare Other

## 2022-10-07 DIAGNOSIS — I639 Cerebral infarction, unspecified: Secondary | ICD-10-CM

## 2022-10-07 LAB — CUP PACEART REMOTE DEVICE CHECK
Date Time Interrogation Session: 20240717152652
Implantable Pulse Generator Implant Date: 20210810
Zone Setting Status: 755011
Zone Setting Status: 755011

## 2022-10-13 ENCOUNTER — Other Ambulatory Visit: Payer: Self-pay | Admitting: Family Medicine

## 2022-10-19 ENCOUNTER — Ambulatory Visit: Payer: Medicare Other

## 2022-10-22 NOTE — Progress Notes (Signed)
[facility] Summary Report / Loop Recorder 

## 2022-10-28 ENCOUNTER — Ambulatory Visit: Payer: Medicare Other | Admitting: Family Medicine

## 2022-10-28 DIAGNOSIS — L570 Actinic keratosis: Secondary | ICD-10-CM | POA: Diagnosis not present

## 2022-10-28 DIAGNOSIS — C44319 Basal cell carcinoma of skin of other parts of face: Secondary | ICD-10-CM | POA: Diagnosis not present

## 2022-10-28 DIAGNOSIS — L601 Onycholysis: Secondary | ICD-10-CM | POA: Diagnosis not present

## 2022-11-02 ENCOUNTER — Ambulatory Visit (INDEPENDENT_AMBULATORY_CARE_PROVIDER_SITE_OTHER): Payer: Medicare Other | Admitting: Family Medicine

## 2022-11-02 VITALS — BP 155/77 | HR 82 | Temp 97.9°F | Ht 60.0 in | Wt 209.0 lb

## 2022-11-02 DIAGNOSIS — I10 Essential (primary) hypertension: Secondary | ICD-10-CM

## 2022-11-02 DIAGNOSIS — M5441 Lumbago with sciatica, right side: Secondary | ICD-10-CM | POA: Diagnosis not present

## 2022-11-02 DIAGNOSIS — G8929 Other chronic pain: Secondary | ICD-10-CM | POA: Diagnosis not present

## 2022-11-02 NOTE — Progress Notes (Signed)
Subjective:  Patient ID: Kerry Richardson, female    DOB: [birth year]  Age: 82 y.o. MRN: [id number]  CC:  Follow up   HPI:  82 year old female with the below mentioned medical problems presents for follow-up.  Patient reports that she has been experiencing sciatica for the past 3 weeks.  She states that it is better currently.  She has been doing stretches and using heat.  She states that she does not want any intervention at this time.  BP mildly elevated here today.  She is compliant with amlodipine 5 mg daily.  Lipids uncontrolled.  She has declined medication regarding this.  No chest pain or shortness of breath.  No other complaints or concerns at this time.  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   Pure hypercholesterolemia    Cryptogenic stroke (HCC) 10/27/2019   Seasonal allergic rhinitis 02/22/2017   Essential hypertension 07/12/2013   Osteoarthritis, knee 12/03/2011    Social Hx   Social History   Socioeconomic History   Marital status: Widowed    Spouse name: Not on file   Number of children: 2   Years of education: 12+   Highest education level: Associate degree: academic program  Occupational History   Occupation: Veterinary surgeon  Tobacco Use   Smoking status: Never   Smokeless tobacco: Never  Vaping Use   Vaping status: Never Used  Substance and Sexual Activity   Alcohol use: Never   Drug use: No   Sexual activity: Not Currently    Birth control/protection: Abstinence  Other Topics Concern   Not on file  Social History Narrative   Husband passed in 2020 from Alzheimer's    2 sons-sees often    Several grandchildren.   Social Determinants of Health   Financial Resource Strain: Low Risk  (07/24/2022)   Overall Financial Resource Strain (CARDIA)    Difficulty of Paying Living Expenses: Not hard at all  Food Insecurity: No Food  Insecurity (07/24/2022)   Hunger Vital Sign    Worried About Running Out of Food in the Last Year: Never true    Ran Out of Food in the Last Year: Never true  Transportation Needs: No Transportation Needs (07/31/2022)   PRAPARE - Administrator, Civil Service (Medical): No    Lack of Transportation (Non-Medical): No  Physical Activity: Inactive (07/31/2022)   Exercise Vital Sign    Days of Exercise per Week: 0 days    Minutes of Exercise per Session: 0 min  Stress: No Stress Concern Present (07/31/2022)   [employer] of Occupational Health - Occupational Stress Questionnaire    Feeling of Stress : Not at all  Social Connections: Socially Integrated (07/31/2022)   Social Connection and Isolation Panel [NHANES]    Frequency of Communication with Friends and Family: More than three times a week    Frequency of Social Gatherings with Friends and Family: More than three times a week    Attends Religious Services: More than 4 times per year    Active Member of [employer] or Organizations: Yes    Attends Engineer, structural: More than 4 times per year    Marital Status: Married    Review of Systems Per HPI  Objective:  BP (!) 155/77   Pulse 82   Temp 97.9 F (36.6 C)   Ht 5' (1.524 m)  Wt 209 lb (94.8 kg)   SpO2 95%   BMI 40.82 kg/m      11/02/2022    1:47 PM 07/31/2022    1:48 PM 07/28/2022   10:56 AM  BP/Weight  Systolic BP 155 145 145  Diastolic BP 77 81 81  Wt. (Lbs) 209 205   BMI 40.82 kg/m2 40.04 kg/m2     Physical Exam Vitals and nursing note reviewed.  Constitutional:      General: She is not in acute distress.    Appearance: Normal appearance.  HENT:     Head: Normocephalic and atraumatic.  Eyes:     General:        Right eye: No discharge.        Left eye: No discharge.     Conjunctiva/sclera: Conjunctivae normal.  Cardiovascular:     Rate and Rhythm: Normal rate and regular rhythm.  Pulmonary:     Effort: Pulmonary effort is normal.      Breath sounds: Normal breath sounds. No wheezing, rhonchi or rales.  Neurological:     Mental Status: She is alert.  Psychiatric:        Mood and Affect: Mood normal.        Behavior: Behavior normal.     Lab Results  Component Value Date   WBC 8.8 07/28/2022   HGB 14.0 07/28/2022   HCT 43.7 07/28/2022   PLT 354 07/28/2022   GLUCOSE 95 07/28/2022   CHOL 258 (H) 07/28/2022   TRIG 106 07/28/2022   HDL 79 07/28/2022   LDLCALC 161 (H) 07/28/2022   ALT 13 07/28/2022   AST 15 07/28/2022   NA 141 07/28/2022   K 4.4 07/28/2022   CL 101 07/28/2022   CREATININE 0.75 07/28/2022   BUN 13 07/28/2022   CO2 25 07/28/2022   TSH 1.780 06/27/2014   INR 0.9 10/27/2019   HGBA1C 6.2 (H) 07/28/2022     Assessment & Plan:   Problem List Items Addressed This Visit       Cardiovascular and Mediastinum   Essential hypertension - Primary    BP mildly elevated here today.  Has been fairly well-controlled.  She states that she was in quite a bit hurry to get here.  Continue amlodipine.  Continue close monitoring.        Nervous and Auditory   Chronic bilateral low back pain with sciatica    Improved currently.  Declines intervention at this time.      Follow-up:  6 months  Brook Geraci Adriana Simas DO [facility]

## 2022-11-02 NOTE — Patient Instructions (Signed)
Follow up in 6 months.  Continue your medications.  Take care  Dr. Lacinda Axon

## 2022-11-02 NOTE — Assessment & Plan Note (Signed)
Improved currently.  Declines intervention at this time.

## 2022-11-02 NOTE — Assessment & Plan Note (Signed)
BP mildly elevated here today.  Has been fairly well-controlled.  She states that she was in quite a bit hurry to get here.  Continue amlodipine.  Continue close monitoring.

## 2022-11-09 ENCOUNTER — Ambulatory Visit (INDEPENDENT_AMBULATORY_CARE_PROVIDER_SITE_OTHER): Payer: Medicare Other

## 2022-11-09 DIAGNOSIS — I639 Cerebral infarction, unspecified: Secondary | ICD-10-CM | POA: Diagnosis not present

## 2022-11-10 LAB — CUP PACEART REMOTE DEVICE CHECK
Date Time Interrogation Session: 20240820003334
Implantable Pulse Generator Implant Date: 20210810

## 2022-11-12 DIAGNOSIS — H04221 Epiphora due to insufficient drainage, right lacrimal gland: Secondary | ICD-10-CM | POA: Diagnosis not present

## 2022-11-12 DIAGNOSIS — H401132 Primary open-angle glaucoma, bilateral, moderate stage: Secondary | ICD-10-CM | POA: Diagnosis not present

## 2022-11-20 NOTE — Progress Notes (Signed)
[facility] Summary Report / Loop Recorder 

## 2022-11-24 ENCOUNTER — Ambulatory Visit: Payer: Medicare Other

## 2022-12-10 DIAGNOSIS — C44319 Basal cell carcinoma of skin of other parts of face: Secondary | ICD-10-CM | POA: Diagnosis not present

## 2022-12-14 ENCOUNTER — Ambulatory Visit (INDEPENDENT_AMBULATORY_CARE_PROVIDER_SITE_OTHER): Payer: Medicare Other

## 2022-12-14 DIAGNOSIS — I639 Cerebral infarction, unspecified: Secondary | ICD-10-CM | POA: Diagnosis not present

## 2022-12-15 LAB — CUP PACEART REMOTE DEVICE CHECK
Date Time Interrogation Session: 20240922003655
Implantable Pulse Generator Implant Date: 20210810

## 2022-12-28 ENCOUNTER — Ambulatory Visit: Payer: Medicare Other

## 2022-12-29 NOTE — Progress Notes (Signed)
[facility] Summary Report / Loop Recorder 

## 2023-01-08 DIAGNOSIS — K080 Exfoliation of teeth due to systemic causes: Secondary | ICD-10-CM | POA: Diagnosis not present

## 2023-01-18 ENCOUNTER — Ambulatory Visit (INDEPENDENT_AMBULATORY_CARE_PROVIDER_SITE_OTHER): Payer: Self-pay

## 2023-01-18 DIAGNOSIS — I639 Cerebral infarction, unspecified: Secondary | ICD-10-CM

## 2023-01-18 LAB — CUP PACEART REMOTE DEVICE CHECK
Date Time Interrogation Session: 20241027230401
Implantable Pulse Generator Implant Date: 20210810

## 2023-02-01 ENCOUNTER — Ambulatory Visit: Payer: Medicare Other

## 2023-02-08 NOTE — Progress Notes (Signed)
[facility] Summary Report / Loop Recorder 

## 2023-02-19 ENCOUNTER — Other Ambulatory Visit: Payer: Self-pay | Admitting: Family Medicine

## 2023-02-19 DIAGNOSIS — I10 Essential (primary) hypertension: Secondary | ICD-10-CM

## 2023-02-21 LAB — CUP PACEART REMOTE DEVICE CHECK
Date Time Interrogation Session: 20241130231816
Implantable Pulse Generator Implant Date: 20210810

## 2023-02-22 ENCOUNTER — Ambulatory Visit (INDEPENDENT_AMBULATORY_CARE_PROVIDER_SITE_OTHER): Payer: Medicare Other

## 2023-02-22 DIAGNOSIS — I639 Cerebral infarction, unspecified: Secondary | ICD-10-CM | POA: Diagnosis not present

## 2023-03-08 ENCOUNTER — Ambulatory Visit: Payer: Medicare Other

## 2023-03-29 ENCOUNTER — Ambulatory Visit (INDEPENDENT_AMBULATORY_CARE_PROVIDER_SITE_OTHER): Payer: Medicare Other

## 2023-03-29 DIAGNOSIS — I639 Cerebral infarction, unspecified: Secondary | ICD-10-CM | POA: Diagnosis not present

## 2023-03-29 LAB — CUP PACEART REMOTE DEVICE CHECK
Date Time Interrogation Session: 20250104230854
Implantable Pulse Generator Implant Date: 20210810

## 2023-05-03 ENCOUNTER — Ambulatory Visit: Payer: Medicare Other

## 2023-05-04 ENCOUNTER — Telehealth: Payer: Self-pay

## 2023-05-04 LAB — CUP PACEART REMOTE DEVICE CHECK
Date Time Interrogation Session: 20250211111048
Implantable Pulse Generator Implant Date: 20210810
Zone Setting Status: 755011
Zone Setting Status: 755011

## 2023-05-04 NOTE — Telephone Encounter (Signed)
Alert remote transmission: RRT The device reached the elective replacement indicator on 04/23/2023, sent to triage Follow up as scheduled.

## 2023-05-05 NOTE — Telephone Encounter (Signed)
I canceled all upcoming remotes. I sent her a return kit. I also marked her Inactive in [facility] and took her out of [facility].

## 2023-05-05 NOTE — Telephone Encounter (Signed)
I spoke with patient.  She would like to think about whether or not to have removed and call us back if she chooses to.

## 2023-05-06 ENCOUNTER — Ambulatory Visit (INDEPENDENT_AMBULATORY_CARE_PROVIDER_SITE_OTHER): Payer: Medicare Other | Admitting: Family Medicine

## 2023-05-06 VITALS — BP 149/72 | HR 66 | Temp 97.2°F | Ht 60.0 in | Wt 207.0 lb

## 2023-05-06 DIAGNOSIS — Z130 Encounter for screening for diseases of the blood and blood-forming organs and certain disorders involving the immune mechanism: Secondary | ICD-10-CM

## 2023-05-06 DIAGNOSIS — I10 Essential (primary) hypertension: Secondary | ICD-10-CM | POA: Diagnosis not present

## 2023-05-06 DIAGNOSIS — R7303 Prediabetes: Secondary | ICD-10-CM | POA: Diagnosis not present

## 2023-05-06 DIAGNOSIS — E7800 Pure hypercholesterolemia, unspecified: Secondary | ICD-10-CM | POA: Diagnosis not present

## 2023-05-06 MED ORDER — KETOCONAZOLE 2 % EX CREA: TOPICAL_CREAM | Freq: Two times a day (BID) | CUTANEOUS | 3 refills | Status: AC | PRN

## 2023-05-06 MED ORDER — AMLODIPINE BESYLATE 5 MG PO TABS: 5.0000 mg | ORAL_TABLET | Freq: Every day | ORAL | 0 refills | Status: DC

## 2023-05-06 NOTE — Patient Instructions (Signed)
Medications refilled.  Follow up in 6 months.  Call with concerns.

## 2023-05-09 NOTE — Assessment & Plan Note (Signed)
 Labs ordered. Declines treatment.

## 2023-05-09 NOTE — Progress Notes (Signed)
 Subjective:  Patient ID: Kerry Richardson, female    DOB: 04-Jul-1940  Age: 83 y.o. MRN: [id number]  CC:   Chief Complaint  Patient presents with   Hypertension    HPI:  83 year old female with a history of HTN, Chronic low back pain, OA, HLD, Prediabetes, Cryptogenic stroke presents for follow up.   Patient states that overall she is doing well.  BP fairly well controlled given age. Compliant with amlodipine. Needs refill. Needs labs.  Lipids uncontrolled. Patient declines treatment.   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   Pure hypercholesterolemia    Cryptogenic stroke (HCC) 10/27/2019   Seasonal allergic rhinitis 02/22/2017   Essential hypertension 07/12/2013   Osteoarthritis, knee 12/03/2011    Social Hx   Social History   Socioeconomic History   Marital status: Widowed    Spouse name: Not on file   Number of children: 2   Years of education: 12+   Highest education level: Associate degree: academic program  Occupational History   Occupation: Veterinary surgeon  Tobacco Use   Smoking status: Never   Smokeless tobacco: Never  Vaping Use   Vaping status: Never Used  Substance and Sexual Activity   Alcohol use: Never   Drug use: No   Sexual activity: Not Currently    Birth control/protection: Abstinence  Other Topics Concern   Not on file  Social History Narrative   Husband passed in 2020 from Alzheimer's    2 sons-sees often    Several grandchildren.   Social Drivers of Corporate investment banker Strain: Low Risk  (05/02/2023)   Overall Financial Resource Strain (CARDIA)    Difficulty of Paying Living Expenses: Not hard at all  Food Insecurity: No Food Insecurity (05/02/2023)   Hunger Vital Sign    Worried About Running Out of Food in the Last Year: Never true    Ran Out of Food in the Last Year: Never true  Transportation Needs: No  Transportation Needs (05/02/2023)   PRAPARE - Administrator, Civil Service (Medical): No    Lack of Transportation (Non-Medical): No  Physical Activity: Unknown (05/02/2023)   Exercise Vital Sign    Days of Exercise per Week: Patient declined    Minutes of Exercise per Session: 0 min  Stress: No Stress Concern Present (05/02/2023)   [employer] of Occupational Health - Occupational Stress Questionnaire    Feeling of Stress : Not at all  Social Connections: Moderately Integrated (05/02/2023)   Social Connection and Isolation Panel [NHANES]    Frequency of Communication with Friends and Family: More than three times a week    Frequency of Social Gatherings with Friends and Family: Three times a week    Attends Religious Services: More than 4 times per year    Active Member of Clubs or Organizations: Yes    Attends Banker Meetings: More than 4 times per year    Marital Status: Widowed    Review of Systems Per HPI  Objective:  BP (!) 149/72   Pulse 66   Temp (!) 97.2 F (36.2 C)   Ht 5' (1.524 m)   Wt 207 lb (93.9 kg)   SpO2 96%   BMI 40.43 kg/m      05/06/2023    1:04 PM 11/02/2022    1:47 PM 07/31/2022    1:48  PM  BP/Weight  Systolic BP 149 155 145  Diastolic BP 72 77 81  Wt. (Lbs) 207 209 205  BMI 40.43 kg/m2 40.82 kg/m2 40.04 kg/m2    Physical Exam Vitals and nursing note reviewed.  Constitutional:      General: She is not in acute distress.    Appearance: Normal appearance.  HENT:     Head: Normocephalic and atraumatic.  Eyes:     General:        Right eye: No discharge.        Left eye: No discharge.     Conjunctiva/sclera: Conjunctivae normal.  Cardiovascular:     Rate and Rhythm: Normal rate and regular rhythm.  Pulmonary:     Effort: Pulmonary effort is normal.     Breath sounds: Normal breath sounds. No wheezing, rhonchi or rales.  Neurological:     Mental Status: She is alert.  Psychiatric:        Mood and Affect: Mood  normal.        Behavior: Behavior normal.     Lab Results  Component Value Date   WBC 8.8 07/28/2022   HGB 14.0 07/28/2022   HCT 43.7 07/28/2022   PLT 354 07/28/2022   GLUCOSE 95 07/28/2022   CHOL 258 (H) 07/28/2022   TRIG 106 07/28/2022   HDL 79 07/28/2022   LDLCALC 161 (H) 07/28/2022   ALT 13 07/28/2022   AST 15 07/28/2022   NA 141 07/28/2022   K 4.4 07/28/2022   CL 101 07/28/2022   CREATININE 0.75 07/28/2022   BUN 13 07/28/2022   CO2 25 07/28/2022   TSH 1.780 06/27/2014   INR 0.9 10/27/2019   HGBA1C 6.2 (H) 07/28/2022     Assessment & Plan:   Problem List Items Addressed This Visit       Cardiovascular and Mediastinum   Essential hypertension - Primary   Fair control given age. Continue current dosing of Amlodipine. Refilled today.      Relevant Medications   amLODipine (NORVASC) 5 MG tablet   Other Relevant Orders   CMP14+EGFR   Microalbumin / creatinine urine ratio     Other   Prediabetes   Relevant Orders   Hemoglobin A1c   Pure hypercholesterolemia   Labs ordered. Declines treatment.      Relevant Medications   amLODipine (NORVASC) 5 MG tablet   Other Relevant Orders   Lipid panel   Other Visit Diagnoses       Screening for deficiency anemia       Relevant Orders   CBC       Meds ordered this encounter  Medications   amLODipine (NORVASC) 5 MG tablet    Sig: Take 1 tablet (5 mg total) by mouth daily.    Dispense:  90 tablet    Refill:  0   ketoconazole (NIZORAL) 2 % cream    Sig: Apply topically 2 (two) times daily as needed for irritation.    Dispense:  60 g    Refill:  3    Follow-up:  Return in about 6 months (around 11/03/2023).  Everlene Other DO [facility]

## 2023-05-09 NOTE — Assessment & Plan Note (Signed)
 Fair control given age. Continue current dosing of Amlodipine. Refilled today.

## 2023-05-10 NOTE — Progress Notes (Signed)
 [facility] Summary Report / Loop Recorder

## 2023-05-17 DIAGNOSIS — I10 Essential (primary) hypertension: Secondary | ICD-10-CM | POA: Diagnosis not present

## 2023-05-17 DIAGNOSIS — E7800 Pure hypercholesterolemia, unspecified: Secondary | ICD-10-CM | POA: Diagnosis not present

## 2023-05-17 DIAGNOSIS — Z130 Encounter for screening for diseases of the blood and blood-forming organs and certain disorders involving the immune mechanism: Secondary | ICD-10-CM | POA: Diagnosis not present

## 2023-05-17 DIAGNOSIS — R7303 Prediabetes: Secondary | ICD-10-CM | POA: Diagnosis not present

## 2023-05-18 ENCOUNTER — Encounter: Payer: Self-pay | Admitting: Cardiovascular Disease

## 2023-05-18 LAB — MICROALBUMIN / CREATININE URINE RATIO
Creatinine, Urine: 116.2 mg/dL
Microalb/Creat Ratio: 5 mg/g{creat} (ref 0–29)
Microalbumin, Urine: 5.6 ug/mL

## 2023-05-18 LAB — CBC
Hematocrit: 44.7 % (ref 34.0–46.6)
Hemoglobin: 14.6 g/dL (ref 11.1–15.9)
MCH: 30 pg (ref 26.6–33.0)
MCHC: 32.7 g/dL (ref 31.5–35.7)
MCV: 92 fL (ref 79–97)
Platelets: 319 10*3/uL (ref 150–450)
RBC: 4.87 x10E6/uL (ref 3.77–5.28)
RDW: 13.3 % (ref 11.7–15.4)
WBC: 9 10*3/uL (ref 3.4–10.8)

## 2023-05-18 LAB — CMP14+EGFR
ALT: 15 IU/L (ref 0–32)
AST: 16 IU/L (ref 0–40)
Albumin: 4.5 g/dL (ref 3.7–4.7)
Alkaline Phosphatase: 91 IU/L (ref 44–121)
BUN/Creatinine Ratio: 17 (ref 12–28)
BUN: 9 mg/dL (ref 8–27)
Bilirubin Total: 0.4 mg/dL (ref 0.0–1.2)
CO2: 23 mmol/L (ref 20–29)
Calcium: 9.4 mg/dL (ref 8.7–10.3)
Chloride: 100 mmol/L (ref 96–106)
Creatinine, Ser: 0.53 mg/dL — ABNORMAL LOW (ref 0.57–1.00)
Globulin, Total: 2.7 g/dL (ref 1.5–4.5)
Glucose: 88 mg/dL (ref 70–99)
Potassium: 4.3 mmol/L (ref 3.5–5.2)
Sodium: 140 mmol/L (ref 134–144)
Total Protein: 7.2 g/dL (ref 6.0–8.5)
eGFR: 92 mL/min/{1.73_m2} (ref >59)

## 2023-05-18 LAB — LIPID PANEL
Chol/HDL Ratio: 3 ratio (ref 0.0–4.4)
Cholesterol, Total: 238 mg/dL — ABNORMAL HIGH (ref 100–199)
HDL: 80 mg/dL (ref >39)
LDL Chol Calc (NIH): 141 mg/dL — ABNORMAL HIGH (ref 0–99)
Triglycerides: 97 mg/dL (ref 0–149)
VLDL Cholesterol Cal: 17 mg/dL (ref 5–40)

## 2023-05-18 LAB — HEMOGLOBIN A1C
Est. average glucose Bld gHb Est-mCnc: 134 mg/dL
Hgb A1c MFr Bld: 6.3 % — ABNORMAL HIGH (ref 4.8–5.6)

## 2023-05-23 ENCOUNTER — Encounter: Payer: Self-pay | Admitting: Family Medicine

## 2023-06-07 DIAGNOSIS — H01002 Unspecified blepharitis right lower eyelid: Secondary | ICD-10-CM | POA: Diagnosis not present

## 2023-06-07 DIAGNOSIS — H401132 Primary open-angle glaucoma, bilateral, moderate stage: Secondary | ICD-10-CM | POA: Diagnosis not present

## 2023-06-07 DIAGNOSIS — H04221 Epiphora due to insufficient drainage, right lacrimal gland: Secondary | ICD-10-CM | POA: Diagnosis not present

## 2023-06-07 DIAGNOSIS — H01001 Unspecified blepharitis right upper eyelid: Secondary | ICD-10-CM | POA: Diagnosis not present

## 2023-07-16 DIAGNOSIS — K080 Exfoliation of teeth due to systemic causes: Secondary | ICD-10-CM | POA: Diagnosis not present

## 2023-07-19 DIAGNOSIS — H903 Sensorineural hearing loss, bilateral: Secondary | ICD-10-CM | POA: Diagnosis not present

## 2023-07-20 DIAGNOSIS — K080 Exfoliation of teeth due to systemic causes: Secondary | ICD-10-CM | POA: Diagnosis not present

## 2023-08-06 ENCOUNTER — Ambulatory Visit: Payer: Medicare Other

## 2023-08-06 VITALS — Ht 60.0 in | Wt 207.0 lb

## 2023-08-06 DIAGNOSIS — Z0000 Encounter for general adult medical examination without abnormal findings: Secondary | ICD-10-CM | POA: Diagnosis not present

## 2023-08-06 NOTE — Patient Instructions (Signed)
 Kerry Richardson , Thank you for taking time out of your busy schedule to complete your Annual Wellness Visit with me. I enjoyed our conversation and look forward to speaking with you again next year. I, as well as your care team,  appreciate your ongoing commitment to your health goals. Please review the following plan we discussed and let me know if I can assist you in the future. Your Game plan/ To Do List   Follow up Visits: Next Medicare AWV with our clinical staff: In 1 year    Have you seen your provider in the last 6 months (3 months if uncontrolled diabetes)? Yes Next Office Visit with your provider: Scheduled for 11/04/23 @ 1:40  Clinician Recommendations:  Aim for 30 minutes of exercise or brisk walking, 6-8 glasses of water, and 5 servings of fruits and vegetables each day.       This is a list of the screening recommended for you and due dates:  Health Maintenance  Topic Date Due   Zoster (Shingles) Vaccine (1 of 2) 08/09/1989   Flu Shot  10/22/2023   Medicare Annual Wellness Visit  08/05/2024   DTaP/Tdap/Td vaccine (3 - Tdap) 07/15/2025   Pneumonia Vaccine  Completed   DEXA scan (bone density measurement)  Completed   HPV Vaccine  Aged Out   Meningitis B Vaccine  Aged Out   COVID-19 Vaccine  Discontinued    Advanced directives: (ACP Link)Information on Advanced Care Planning can be found at [place]  Secretary of [facility] Advance Health Care Directives Advance Health Care Directives. [email or web address]   Advance Care Planning is important because it:  [x]  Makes sure you receive the medical care that is consistent with your values, goals, and preferences  [x]  It provides guidance to your family and loved ones and reduces their decisional burden about whether or not they are making the right decisions based on your wishes.  Follow the link provided in your after visit summary or read over the paperwork we have mailed to you to help you started getting your Advance Directives in place.  If you need assistance in completing these, please reach out to us  so that we can help you!  See attachments for Preventive Care and Fall Prevention Tips.

## 2023-08-06 NOTE — Progress Notes (Signed)
 Subjective:   Kerry Richardson is a 83 y.o. who presents for a Medicare Wellness preventive visit.  As a reminder, Annual Wellness Visits don't include a physical exam, and some assessments may be limited, especially if this visit is performed virtually. We may recommend an in-person follow-up visit with your provider if needed.  Visit Complete: Virtual I connected with  Devin Foerster on 08/06/23 by a audio enabled telemedicine application and verified that I am speaking with the correct person using two identifiers.  Patient Location: Home  Provider Location: Home Office  I discussed the limitations of evaluation and management by telemedicine. The patient expressed understanding and agreed to proceed.  Vital Signs: Because this visit was a virtual/telehealth visit, some criteria may be missing or patient reported. Any vitals not documented were not able to be obtained and vitals that have been documented are patient reported.  VideoDeclined- This patient declined Librarian, academic. Therefore the visit was completed with audio only.  Persons Participating in Visit: Patient.  AWV Questionnaire: Yes: Patient Medicare AWV questionnaire was completed by the patient on 08/02/23; I have confirmed that all information answered by patient is correct and no changes since this date.  Cardiac Risk Factors include: advanced age (>2men, >75 women);hypertension;dyslipidemia     Objective:     Today's Vitals   08/06/23 1341  Weight: 207 lb (93.9 kg)  Height: 5' (1.524 m)   Body mass index is 40.43 kg/m.     08/06/2023    2:06 PM 07/31/2022    1:56 PM 07/15/2021    1:19 PM 07/09/2020    1:56 PM 10/27/2019    9:00 PM 08/02/2017   10:45 AM 07/26/2017    1:14 PM  Advanced Directives  Does Patient Have a Medical Advance Directive? Yes Yes Yes Yes Yes Yes Yes  Type of Estate agent of [place];Living will Healthcare Power of [place];Living will  Healthcare Power of [place];Living will Healthcare Power of [place];Living will Healthcare Power of [place];Living will Healthcare Power of [employer] Power of Attorney  Does patient want to make changes to medical advance directive? No - Patient declined No - Patient declined  No - Patient declined No - Patient declined    Copy of Healthcare Power of Attorney in Chart? Yes - validated most recent copy scanned in chart (See row information) Yes - validated most recent copy scanned in chart (See row information) Yes - validated most recent copy scanned in chart (See row information) Yes - validated most recent copy scanned in chart (See row information)  Yes Yes    Current Medications (verified) Outpatient Encounter Medications as of 08/06/2023  Medication Sig   amLODipine  (NORVASC ) 5 MG tablet Take 1 tablet (5 mg total) by mouth daily.   Ascorbic Acid (VITAMIN C) 250 MG CHEW    aspirin  EC 81 MG EC tablet Take 1 tablet (81 mg total) by mouth daily. Swallow whole.   Cholecalciferol (VITAMIN D) 125 MCG (5000 UT) CAPS Take 5,000 Units by mouth daily.    fluorouracil (EFUDEX) 5 % cream Apply topically 2 (two) times daily.   ketoconazole  (NIZORAL ) 2 % cream Apply topically 2 (two) times daily as needed for irritation.   NIACINAMIDE PO Take 1 tablet by mouth daily.   triamcinolone cream (KENALOG) 0.1 % Apply 1 application topically 2 (two) times daily as needed (itching).    Zinc 50 MG TABS Take 50 mg by mouth daily.   No facility-administered encounter medications on  file as of 08/06/2023.    Allergies (verified) Codeine   History: Past Medical History:  Diagnosis Date   Allergy    chronic   Alpha galactosidase deficiency    Arthritis    Back pain    Bulging discs    Bursitis    Cataract Several years ago   Removed   DDD (degenerative disc disease)    Dyspnea    with exertion   GERD (gastroesophageal reflux disease)    mild   Glaucoma    H/O removal of cyst    finger  (uncertain of which finger)   Hypertension 2021   Migraine headache    Neck pain    PONV (postoperative nausea and vomiting)    Reflux    Seasonal allergies    Stroke (HCC) 10/22/2019   Past Surgical History:  Procedure Laterality Date   ABDOMINAL HYSTERECTOMY     CARPAL TUNNEL RELEASE     bilateral   CATARACT EXTRACTION W/PHACO Left 10/01/2014   Procedure: CATARACT EXTRACTION PHACO AND INTRAOCULAR LENS PLACEMENT LEFT EYE;  Surgeon: Anner Kill, MD;  Location: [facility];  Service: Ophthalmology;  Laterality: Left;  CDE:8.15   CATARACT EXTRACTION W/PHACO Right 08/02/2017   Procedure: CATARACT EXTRACTION WITH PHACOEMULSIFICATION  AND INTRAOCULAR LENS PLACEMENT AND PLACEMENT OF iSTENT GLAUCOMA DEVICE RIGHT EYE;  Surgeon: Anner Kill, MD;  Location: [facility];  Service: Ophthalmology;  Laterality: Right;  CDE: 7.77   CERVICAL FUSION     CHOLECYSTECTOMY N/A 05/15/2013   Procedure: LAPAROSCOPIC CHOLECYSTECTOMY WITH INTRAOPERATIVE CHOLANGIOGRAM;  Surgeon: Beau Bound, MD;  Location: [facility];  Service: General;  Laterality: N/A;   CHONDROPLASTY  03/14/2012   Procedure: CHONDROPLASTY;  Surgeon: Darrin Emerald, MD;  Location: [facility];  Service: Orthopedics;  Laterality: Left;  medial condyle   CHONDROPLASTY Right 12/23/2012   Procedure: KNEE ARTHROSCOPY WITH CHONDROPLASTY WITH LIMITED DEBRIDMENT;  Surgeon: Darrin Emerald, MD;  Location: [facility];  Service: Orthopedics;  Laterality: Right;   EYE SURGERY  Cataract both 4 yrs (?)   FOOT SURGERY Bilateral    morton's neuroma   implantable loop recorder placement  10/31/2019    Medtronic Reveal Linq model LNQ 11 (SN [id number]) implantable loop recorder implanted by Dr Nunzio Belch for cryptogenic stroke   KNEE ARTHROSCOPY WITH MEDIAL MENISECTOMY  03/14/2012   Procedure: KNEE ARTHROSCOPY WITH MEDIAL MENISECTOMY;  Surgeon: Darrin Emerald, MD;  Location: [facility];  Service: Orthopedics;  Laterality: Left;   SPINE SURGERY  Neck/plate & screws   TEAR DUCT  PROBING Right 09/2020   Tear duct bypass surgery per pt.   Family History  Problem Relation Age of Onset   Hypertension Mother        borderline   Stroke Maternal Grandmother    Stroke Maternal Grandfather    Social History   Socioeconomic History   Marital status: Widowed    Spouse name: Not on file   Number of children: 2   Years of education: 12+   Highest education level: Associate degree: academic program  Occupational History   Occupation: Veterinary surgeon  Tobacco Use   Smoking status: Never   Smokeless tobacco: Never  Vaping Use   Vaping status: Never Used  Substance and Sexual Activity   Alcohol use: Never   Drug use: No   Sexual activity: Not Currently    Birth control/protection: Abstinence  Other Topics Concern   Not on file  Social History Narrative   Husband passed in 2020 from Alzheimer's  2 sons-sees often    Several grandchildren.   Social Drivers of Corporate investment banker Strain: Low Risk  (08/06/2023)   Overall Financial Resource Strain (CARDIA)    Difficulty of Paying Living Expenses: Not hard at all  Food Insecurity: No Food Insecurity (08/06/2023)   Hunger Vital Sign    Worried About Running Out of Food in the Last Year: Never true    Ran Out of Food in the Last Year: Never true  Transportation Needs: No Transportation Needs (08/06/2023)   PRAPARE - Administrator, Civil Service (Medical): No    Lack of Transportation (Non-Medical): No  Physical Activity: Inactive (08/06/2023)   Exercise Vital Sign    Days of Exercise per Week: 0 days    Minutes of Exercise per Session: 0 min  Stress: No Stress Concern Present (08/06/2023)   [employer] of Occupational Health - Occupational Stress Questionnaire    Feeling of Stress : Not at all  Social Connections: Moderately Integrated (08/06/2023)   Social Connection and Isolation Panel [NHANES]    Frequency of Communication with Friends and Family: More than three times a week    Frequency  of Social Gatherings with Friends and Family: Three times a week    Attends Religious Services: More than 4 times per year    Active Member of Clubs or Organizations: Yes    Attends Banker Meetings: More than 4 times per year    Marital Status: Widowed    Tobacco Counseling Counseling given: Not Answered    Clinical Intake:  Pre-visit preparation completed: Yes  Pain : No/denies pain     Diabetes: No  Lab Results  Component Value Date   HGBA1C 6.3 (H) 05/17/2023   HGBA1C 6.2 (H) 07/28/2022   HGBA1C 6.0 (H) 07/21/2021     How often do you need to have someone help you when you read instructions, pamphlets, or other written materials from your doctor or pharmacy?: 1 - Never  Interpreter Needed?: No  Information entered by :: Seabron Cypress LPN   Activities of Daily Living     08/02/2023    6:49 PM  In your present state of health, do you have any difficulty performing the following activities:  Hearing? 0  Vision? 0  Difficulty concentrating or making decisions? 0  Walking or climbing stairs? 1  Dressing or bathing? 0  Doing errands, shopping? 0  Preparing Food and eating ? N  Using the Toilet? N  In the past six months, have you accidently leaked urine? N  Do you have problems with loss of bowel control? N  Managing your Medications? N  Managing your Finances? N  Housekeeping or managing your Housekeeping? N    Patient Care Team: Cook, Jayce G, DO as PCP - General (Family Medicine) Jolly Needle, MD (Inactive) as PCP - Cardiology (Cardiology)  Indicate any recent Medical Services you may have received from other than [facility] providers in the past year (date may be approximate).     Assessment:    This is a routine wellness examination for Kerry Richardson.  Hearing/Vision screen Hearing Screening - Comments:: Hearing loss; hearing aids   Vision Screening - Comments:: No vision problems; will schedule routine eye exam soon      Goals Addressed              This Visit's Progress    Patient Stated   On track    Patient stated her goal is to be as  active as she can be every day       Depression Screen     08/06/2023    1:45 PM 05/06/2023    1:23 PM 07/31/2022    1:52 PM 07/28/2022   10:58 AM 07/15/2021    1:13 PM 03/10/2021    1:10 PM 07/09/2020    1:59 PM  PHQ 2/9 Scores  PHQ - 2 Score 0 0 0 0 0 0 0  PHQ- 9 Score  0 0 0       Fall Risk     08/02/2023    6:49 PM 07/31/2022    2:12 PM 07/31/2022    9:46 AM 07/28/2022   10:58 AM 07/15/2021    1:20 PM  Fall Risk   Falls in the past year? 1 0 0 0 0  Number falls in past yr: 0 0 0 0 0  Injury with Fall? 0 0 0 0 0  Risk for fall due to : No Fall Risks No Fall Risks No Fall Risks No Fall Risks No Fall Risks  Follow up Falls prevention discussed;Education provided;Falls evaluation completed Falls prevention discussed Falls prevention discussed Falls evaluation completed Falls prevention discussed    MEDICARE RISK AT HOME:  Medicare Risk at Home Any stairs in or around the home?: (Patient-Rptd) Yes If so, are there any without handrails?: (Patient-Rptd) No Home free of loose throw rugs in walkways, pet beds, electrical cords, etc?: (Patient-Rptd) Yes Adequate lighting in your home to reduce risk of falls?: (Patient-Rptd) Yes Life alert?: (Patient-Rptd) No Use of a cane, walker or w/c?: (Patient-Rptd) No Grab bars in the bathroom?: (Patient-Rptd) No Shower chair or bench in shower?: (Patient-Rptd) No Elevated toilet seat or a handicapped toilet?: (Patient-Rptd) No  TIMED UP AND GO:  Was the test performed?  No  Cognitive Function: 6CIT completed        08/06/2023    1:59 PM 07/31/2022    1:59 PM 07/15/2021    1:24 PM  6CIT Screen  What Year? 0 points 0 points 0 points  What month? 0 points 0 points 0 points  What time? 0 points 0 points 0 points  Count back from 20 0 points 0 points 0 points  Months in reverse 0 points 0 points 0 points  Repeat phrase 0 points 0  points 0 points  Total Score 0 points 0 points 0 points    Immunizations Immunization History  Administered Date(s) Administered   Influenza Whole 12/19/2007   Influenza,inj,Quad PF,6+ Mos 12/18/2013, 12/17/2014, 03/09/2016   Influenza-Unspecified 01/17/2013   Pneumococcal Conjugate-13 12/17/2014   Pneumococcal Polysaccharide-23 03/24/2011   Td 07/05/1998, 07/16/2015   Zoster, Live 06/26/2008    Screening Tests Health Maintenance  Topic Date Due   Zoster Vaccines- Shingrix (1 of 2) 08/09/1989   INFLUENZA VACCINE  10/22/2023   Medicare Annual Wellness (AWV)  08/05/2024   DTaP/Tdap/Td (3 - Tdap) 07/15/2025   Pneumonia Vaccine 22+ Years old  Completed   DEXA SCAN  Completed   HPV VACCINES  Aged Out   Meningococcal B Vaccine  Aged Out   COVID-19 Vaccine  Discontinued    Health Maintenance  Health Maintenance Due  Topic Date Due   Zoster Vaccines- Shingrix (1 of 2) 08/09/1989    Additional Screening:  Vision Screening: Recommended annual ophthalmology exams for early detection of glaucoma and other disorders of the eye.  Dental Screening: Recommended annual dental exams for proper oral hygiene  Community Resource Referral / Chronic Care Management: CRR required this  visit?  No   CCM required this visit?  No   Plan:    I have personally reviewed and noted the following in the patient's chart:   Medical and social history Use of alcohol, tobacco or illicit drugs  Current medications and supplements including opioid prescriptions. Patient is not currently taking opioid prescriptions. Functional ability and status Nutritional status Physical activity Advanced directives List of other physicians Hospitalizations, surgeries, and ER visits in previous 12 months Vitals Screenings to include cognitive, depression, and falls Referrals and appointments  In addition, I have reviewed and discussed with patient certain preventive protocols, quality metrics, and best  practice recommendations. A written personalized care plan for preventive services as well as general preventive health recommendations were provided to patient.   Seabron Cypress [place], [place]   06/29/8117   After Visit Summary: (MyChart) Due to this being a telephonic visit, the after visit summary with patients personalized plan was offered to patient via MyChart   Notes: Nothing significant to report at this time.

## 2023-08-17 ENCOUNTER — Other Ambulatory Visit: Payer: Self-pay | Admitting: Family Medicine

## 2023-08-17 DIAGNOSIS — I10 Essential (primary) hypertension: Secondary | ICD-10-CM

## 2023-11-04 ENCOUNTER — Ambulatory Visit (INDEPENDENT_AMBULATORY_CARE_PROVIDER_SITE_OTHER): Payer: Medicare Other | Admitting: Family Medicine

## 2023-11-04 VITALS — BP 149/72 | HR 72 | Ht 60.0 in | Wt 207.0 lb

## 2023-11-04 DIAGNOSIS — G8929 Other chronic pain: Secondary | ICD-10-CM

## 2023-11-04 DIAGNOSIS — M533 Sacrococcygeal disorders, not elsewhere classified: Secondary | ICD-10-CM

## 2023-11-04 DIAGNOSIS — E7800 Pure hypercholesterolemia, unspecified: Secondary | ICD-10-CM

## 2023-11-04 DIAGNOSIS — M545 Low back pain, unspecified: Secondary | ICD-10-CM

## 2023-11-04 DIAGNOSIS — I10 Essential (primary) hypertension: Secondary | ICD-10-CM | POA: Diagnosis not present

## 2023-11-04 NOTE — Patient Instructions (Addendum)
 Xrays at your convenience.  Consider medication.  Referring to PT.  Follow up in 3 months.

## 2023-11-05 DIAGNOSIS — G8929 Other chronic pain: Secondary | ICD-10-CM | POA: Insufficient documentation

## 2023-11-05 NOTE — Assessment & Plan Note (Signed)
 Fair control.  Continue amlodipine .

## 2023-11-05 NOTE — Assessment & Plan Note (Signed)
 X-ray of the lumbar spine to reassess.  Discussed treatment approaches.  Patient amenable to physical therapy.

## 2023-11-05 NOTE — Assessment & Plan Note (Signed)
 Uncontrolled.  Patient continues to decline medication.

## 2023-11-05 NOTE — Assessment & Plan Note (Signed)
 Patient having pain at the right SI joint.  Dedicated film of the SI joint.  May benefit from injection.  Referring to physical therapy.

## 2023-11-05 NOTE — Progress Notes (Signed)
 Subjective:  Patient ID: Kerry Richardson, female    DOB: Oct 28, 1940  Age: 83 y.o. MRN: [id number]  CC:   Chief Complaint  Patient presents with   6 month follow up    right side low back pain / hip    left knee pain     Bone on bone     HPI:  83 year old female presents for evaluation of the above.  Patient reports ongoing right sided low back pain.  She has known chronic low back pain.  She states that this is slightly different.  No current radicular symptoms.  She has had a fall but this was several months ago.  She takes Tylenol  occasionally for the pain.  She does not like to take medication.  Last imaging of the lumbar spine was last year.  Will discuss today.  BP mildly elevated here today.  She is compliant with amlodipine .  Fair control given advanced age.  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/05/2023   Statin declined 01/27/2022   Prediabetes 03/10/2021   Pure hypercholesterolemia    Cryptogenic stroke (HCC) 10/27/2019   Seasonal allergic rhinitis 02/22/2017   Essential hypertension 07/12/2013   Osteoarthritis, knee 12/03/2011    Social Hx   Social History   Socioeconomic History   Marital status: Widowed    Spouse name: Not on file   Number of children: 2   Years of education: 12+   Highest education level: Associate degree: academic program  Occupational History   Occupation: Veterinary surgeon  Tobacco Use   Smoking status: Never   Smokeless tobacco: Never  Vaping Use   Vaping status: Never Used  Substance and Sexual Activity   Alcohol use: Never   Drug use: No   Sexual activity: Not Currently    Birth control/protection: Abstinence  Other Topics Concern   Not on file  Social History Narrative   Husband passed in 2020 from Alzheimer's    2 sons-sees often    Several grandchildren.   Social Drivers of Corporate investment banker Strain: Low Risk  (10/31/2023)    Overall Financial Resource Strain (CARDIA)    Difficulty of Paying Living Expenses: Not hard at all  Food Insecurity: No Food Insecurity (10/31/2023)   Hunger Vital Sign    Worried About Running Out of Food in the Last Year: Never true    Ran Out of Food in the Last Year: Never true  Transportation Needs: No Transportation Needs (10/31/2023)   PRAPARE - Administrator, Civil Service (Medical): No    Lack of Transportation (Non-Medical): No  Physical Activity: Inactive (10/31/2023)   Exercise Vital Sign    Days of Exercise per Week: 0 days    Minutes of Exercise per Session: Not on file  Stress: No Stress Concern Present (10/31/2023)   [employer] of Occupational Health - Occupational Stress Questionnaire    Feeling of Stress: Not at all  Social Connections: Moderately Integrated (10/31/2023)   Social Connection and Isolation Panel    Frequency of Communication with Friends and Family: More than three times a week    Frequency of Social Gatherings with Friends and Family: Once a week    Attends Religious Services: More than 4 times per year    Active Member of [employer] or Organizations: Yes    Attends Banker Meetings: More than 4 times per year  Marital Status: Widowed    Review of Systems Per HPI  Objective:  BP (!) 149/72   Pulse 72   Ht 5' (1.524 m)   Wt 207 lb (93.9 kg)   SpO2 96%   BMI 40.43 kg/m      11/04/2023    1:13 PM 08/06/2023    1:41 PM 05/06/2023    1:04 PM  BP/Weight  Systolic BP 149 -- 149  Diastolic BP 72 -- 72  Wt. (Lbs) 207 207 207  BMI 40.43 kg/m2 40.43 kg/m2 40.43 kg/m2    Physical Exam Vitals and nursing note reviewed.  Constitutional:      Appearance: Normal appearance. She is obese.  HENT:     Head: Normocephalic and atraumatic.  Cardiovascular:     Rate and Rhythm: Normal rate and regular rhythm.  Pulmonary:     Effort: Pulmonary effort is normal.     Breath sounds: Normal breath sounds. No wheezing, rhonchi or  rales.  Musculoskeletal:     Comments: Tenderness over the right SI joint.  Neurological:     Mental Status: She is alert.     Lab Results  Component Value Date   WBC 9.0 05/17/2023   HGB 14.6 05/17/2023   HCT 44.7 05/17/2023   PLT 319 05/17/2023   GLUCOSE 88 05/17/2023   CHOL 238 (H) 05/17/2023   TRIG 97 05/17/2023   HDL 80 05/17/2023   LDLCALC 141 (H) 05/17/2023   ALT 15 05/17/2023   AST 16 05/17/2023   NA 140 05/17/2023   K 4.3 05/17/2023   CL 100 05/17/2023   CREATININE 0.53 (L) 05/17/2023   BUN 9 05/17/2023   CO2 23 05/17/2023   TSH 1.780 06/27/2014   INR 0.9 10/27/2019   HGBA1C 6.3 (H) 05/17/2023     Assessment & Plan:  Chronic right-sided low back pain, unspecified whether sciatica present Assessment & Plan: X-ray of the lumbar spine to reassess.  Discussed treatment approaches.  Patient amenable to physical therapy.  Orders: -     DG Lumbar Spine Complete -     Ambulatory referral to Physical Therapy  Chronic right SI joint pain Assessment & Plan: Patient having pain at the right SI joint.  Dedicated film of the SI joint.  May benefit from injection.  Referring to physical therapy.  Orders: -     DG Si Joints -     Ambulatory referral to Physical Therapy  Essential hypertension Assessment & Plan: Fair control.  Continue amlodipine .   Pure hypercholesterolemia Assessment & Plan: Uncontrolled.  Patient continues to decline medication.     Follow-up:  Return in about 3 months (around 02/04/2024).  Jacqulyn Ahle DO [facility]

## 2023-11-09 ENCOUNTER — Ambulatory Visit (HOSPITAL_COMMUNITY)
Admission: RE | Admit: 2023-11-09 | Discharge: 2023-11-09 | Disposition: A | Discharge status: Home / Self Care | Source: Ambulatory Visit | Attending: Family Medicine | Admitting: Family Medicine

## 2023-11-09 ENCOUNTER — Ambulatory Visit: Payer: Self-pay | Admitting: Family Medicine

## 2023-11-09 DIAGNOSIS — G8929 Other chronic pain: Secondary | ICD-10-CM | POA: Insufficient documentation

## 2023-11-09 DIAGNOSIS — M545 Low back pain, unspecified: Secondary | ICD-10-CM | POA: Diagnosis not present

## 2023-11-09 DIAGNOSIS — M533 Sacrococcygeal disorders, not elsewhere classified: Secondary | ICD-10-CM | POA: Diagnosis not present

## 2023-11-24 DIAGNOSIS — Z961 Presence of intraocular lens: Secondary | ICD-10-CM | POA: Diagnosis not present

## 2023-11-24 DIAGNOSIS — H01001 Unspecified blepharitis right upper eyelid: Secondary | ICD-10-CM | POA: Diagnosis not present

## 2023-11-24 DIAGNOSIS — H04221 Epiphora due to insufficient drainage, right lacrimal gland: Secondary | ICD-10-CM | POA: Diagnosis not present

## 2023-11-24 DIAGNOSIS — H401132 Primary open-angle glaucoma, bilateral, moderate stage: Secondary | ICD-10-CM | POA: Diagnosis not present

## 2023-12-14 ENCOUNTER — Encounter (HOSPITAL_COMMUNITY): Payer: Self-pay

## 2023-12-14 ENCOUNTER — Other Ambulatory Visit: Payer: Self-pay

## 2023-12-14 ENCOUNTER — Ambulatory Visit (HOSPITAL_COMMUNITY): Attending: Family Medicine

## 2023-12-14 DIAGNOSIS — M533 Sacrococcygeal disorders, not elsewhere classified: Secondary | ICD-10-CM | POA: Insufficient documentation

## 2023-12-14 DIAGNOSIS — M5459 Other low back pain: Secondary | ICD-10-CM | POA: Insufficient documentation

## 2023-12-14 DIAGNOSIS — G8929 Other chronic pain: Secondary | ICD-10-CM | POA: Diagnosis not present

## 2023-12-14 DIAGNOSIS — M5386 Other specified dorsopathies, lumbar region: Secondary | ICD-10-CM | POA: Insufficient documentation

## 2023-12-14 DIAGNOSIS — M545 Low back pain, unspecified: Secondary | ICD-10-CM | POA: Insufficient documentation

## 2023-12-14 DIAGNOSIS — Z7409 Other reduced mobility: Secondary | ICD-10-CM | POA: Insufficient documentation

## 2023-12-14 NOTE — Therapy (Signed)
 OUTPATIENT PHYSICAL THERAPY THORACOLUMBAR EVALUATION   Patient Name: Kerry Richardson MRN: [id number] DOB:[birth year], 83 y.o., female Today's Date: 12/14/2023  END OF SESSION:  PT End of Session - 12/14/23 1736     Visit Number 1    Date for Recertification  01/25/24    Authorization Type [employer] MEDICARE    Authorization Time Period seeking auth    Progress Note Due on Visit 10    PT Start Time 1502    PT Stop Time 1543    PT Time Calculation (min) 41 min    Activity Tolerance Patient tolerated treatment well;Patient limited by pain    Behavior During Therapy WFL for tasks assessed/performed          Past Medical History:  Diagnosis Date   Allergy    chronic   Alpha galactosidase deficiency    Arthritis    Back pain    Bulging discs    Bursitis    Cataract Several years ago   Removed   DDD (degenerative disc disease)    Dyspnea    with exertion   GERD (gastroesophageal reflux disease)    mild   Glaucoma    H/O removal of cyst    finger (uncertain of which finger)   Hypertension 2021   Migraine headache    Neck pain    PONV (postoperative nausea and vomiting)    Reflux    Seasonal allergies    Stroke (HCC) 10/22/2019   Past Surgical History:  Procedure Laterality Date   ABDOMINAL HYSTERECTOMY     CARPAL TUNNEL RELEASE     bilateral   CATARACT EXTRACTION W/PHACO Left 10/01/2014   Procedure: CATARACT EXTRACTION PHACO AND INTRAOCULAR LENS PLACEMENT LEFT EYE;  Surgeon: Cherene Mania, MD;  Location: [facility];  Service: Ophthalmology;  Laterality: Left;  CDE:8.15   CATARACT EXTRACTION W/PHACO Right 08/02/2017   Procedure: CATARACT EXTRACTION WITH PHACOEMULSIFICATION  AND INTRAOCULAR LENS PLACEMENT AND PLACEMENT OF iSTENT GLAUCOMA DEVICE RIGHT EYE;  Surgeon: Mania Cherene, MD;  Location: [facility];  Service: Ophthalmology;  Laterality: Right;  CDE: 7.77   CERVICAL FUSION     CHOLECYSTECTOMY N/A 05/15/2013   Procedure: LAPAROSCOPIC CHOLECYSTECTOMY WITH INTRAOPERATIVE  CHOLANGIOGRAM;  Surgeon: Oneil DELENA Budge, MD;  Location: [facility];  Service: General;  Laterality: N/A;   CHONDROPLASTY  03/14/2012   Procedure: CHONDROPLASTY;  Surgeon: Taft FORBES Minerva, MD;  Location: [facility];  Service: Orthopedics;  Laterality: Left;  medial condyle   CHONDROPLASTY Right 12/23/2012   Procedure: KNEE ARTHROSCOPY WITH CHONDROPLASTY WITH LIMITED DEBRIDMENT;  Surgeon: Taft FORBES Minerva, MD;  Location: [facility];  Service: Orthopedics;  Laterality: Right;   EYE SURGERY  Cataract both 4 yrs (?)   FOOT SURGERY Bilateral    morton's neuroma   implantable loop recorder placement  10/31/2019    Medtronic Reveal Linq model LNQ 11 (SN [id number]) implantable loop recorder implanted by Dr Kelsie for cryptogenic stroke   KNEE ARTHROSCOPY WITH MEDIAL MENISECTOMY  03/14/2012   Procedure: KNEE ARTHROSCOPY WITH MEDIAL MENISECTOMY;  Surgeon: Taft FORBES Minerva, MD;  Location: [facility];  Service: Orthopedics;  Laterality: Left;   SPINE SURGERY  Neck/plate & screws   TEAR DUCT PROBING Right 09/2020   Tear duct bypass surgery per pt.   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/05/2023   Statin declined 01/27/2022   Prediabetes 03/10/2021   Pure hypercholesterolemia  Cryptogenic stroke (HCC) 10/27/2019   Seasonal allergic rhinitis 02/22/2017   Essential hypertension 07/12/2013   Osteoarthritis, knee 12/03/2011    PCP: Cook, Jayce G, DO  REFERRING PROVIDER: Bluford Jacqulyn MATSU, DO  REFERRING DIAG: [phone] (ICD-10-CM) - Chronic right-sided low back pain, unspecified whether sciatica present M53.3,G89.29 (ICD-10-CM) - Chronic right SI joint pain  Rationale for Evaluation and Treatment: Rehabilitation  THERAPY DIAG:  Other low back pain  Impaired functional mobility, balance, gait, and endurance  Decreased ROM of lumbar spine  ONSET DATE: several months  SUBJECTIVE:                                                                                                                                                                                            SUBJECTIVE STATEMENT: Pt states back pain has been getting worse for several months. Pt states sciatic problems do occur on occasion, today was the first time in months. Pt states the low back pain keeps her from going to the grand kids ball games, working out in the yard, walk very far, or standing for a long time. Pt states she has been using the cane for long distances for about year.   PERTINENT HISTORY:  Hx of stroke, 2020 no residual effects (perhaps memory issues)  PAIN:  Are you having pain? Yes: NPRS scale: 8/10, probably walking, 0/10 when she is sitting most of the time Pain location: right low back Pain description: sometimes it is a sharp pain, constant ache Aggravating factors: standing, walking, cooking complete meal, chores Relieving factors: sitting down, getting off her feet  PRECAUTIONS: None  RED FLAGS: None   WEIGHT BEARING RESTRICTIONS: No  FALLS:  Has patient fallen in last 6 months? Yes. Number of falls 1, out of rolling chair  LIVING ENVIRONMENT: Lives with: lives alone Lives in: House/apartment Stairs: Yes: Internal: 15 steps; on right going up and External: 4-5 steps; on right going up Has following equipment at home: Single point cane  OCCUPATION: retired, Customer service manager  PLOF: Independent and Independent with basic ADLs  PATIENT GOALS: decrease the back pain, increased functional mobility, and activity tolerance/endurance, improve balance  NEXT MD VISIT: 02/07/24  OBJECTIVE:  Note: Objective measures were completed at Evaluation unless otherwise noted.  DIAGNOSTIC FINDINGS:  CLINICAL DATA:  SI joint pain.   EXAM: BILATERAL SACROILIAC JOINTS - 3+ VIEW   COMPARISON:  None Available.   FINDINGS: Normal and symmetric sacroiliac joints.   No acute fracture or dislocation.   No  aggressive osseous lesion.   Visualized sacral arcuate lines are unremarkable.   Unremarkable symphysis pubis.   There are mild  degenerative changes of bilateral hip joints without significant joint space narrowing. Osteophytosis of the superior acetabulum.   No radiopaque foreign bodies.   IMPRESSION: *No acute osseous abnormality of the sacroiliac joints.  CLINICAL DATA:  Low back pain.   EXAM: LUMBAR SPINE - COMPLETE 4+ VIEW   COMPARISON:  07/28/2022.   FINDINGS: There are 5 nonrib-bearing lumbar vertebrae.   Anatomic lumbar curvature.   No spondylolysis.   There is grade 1 anterolisthesis of L5 over S1.   Vertebral body heights are maintained.   No aggressive osseous lesion.   Moderate multilevel degenerative changes in the form of reduced intervertebral disc height, endplate sclerosis/irregularity, facet arthropathy and marginal osteophyte formation.   Sacroiliac joints are symmetric.   Visualized soft tissues are within normal limits.   There are surgical clips in the right upper quadrant, typical of a previous cholecystectomy.   IMPRESSION: No acute osseous abnormality of the lumbar spine. Moderate multilevel degenerative changes.  PATIENT SURVEYS:  Modified Oswestry:   Modified Oswestry 16 / 50 = 32.0 %  COGNITION: Overall cognitive status: pt reports she feels she has to think longer about things since the stroke in 2020.     SENSATION: Not tested  POSTURE: rounded shoulders, forward head, posterior pelvic tilt, and flexed trunk   PALPATION: Pt demonstrates increased tenderness to palpation of lumbar spine and lumbar paraspinals. (L3-L5, most problematic)  LUMBAR ROM:   AROM eval  Flexion 65  Extension 5  Right lateral flexion 15, discomfort  Left lateral flexion 20  Right rotation 50% available, pain  Left rotation 50% available   (Blank rows = not tested)  LOWER EXTREMITY ROM:     Active  Right eval Left eval  Hip flexion     Hip extension    Hip abduction    Hip adduction    Hip internal rotation    Hip external rotation    Knee flexion    Knee extension    Ankle dorsiflexion    Ankle plantarflexion    Ankle inversion    Ankle eversion     (Blank rows = not tested)  LOWER EXTREMITY MMT:    MMT Right eval Left eval  Hip flexion 3+ 4-  Hip extension 3 3  Hip abduction 3 3+  Hip adduction    Hip internal rotation    Hip external rotation    Knee flexion    Knee extension    Ankle dorsiflexion    Ankle plantarflexion    Ankle inversion    Ankle eversion     (Blank rows = not tested)  LUMBAR SPECIAL TESTS:  Straight leg raise test: Negative  FUNCTIONAL TESTS:  5 times sit to stand: 15.84 seconds no increased pain 2 minute walk test: 235 feet, increased pain from 3/10 to 8/10.  GAIT: Distance walked: 235 feet Assistive device utilized: None Level of assistance: Complete Independence Comments: pt demonstrates decreased gait speed, antalgic pattern and SOB following walk test.  TREATMENT DATE:  12/14/2023  Evaluation: -ROM measured, Strength assessed, HEP prescribed, pt educated on prognosis, findings, and importance of HEP compliance if given.  PATIENT EDUCATION:  Education details: Pt was educated on findings of PT evaluation, prognosis, frequency of therapy visits and rationale, attendance policy, and HEP if given.   Person educated: Patient Education method: Explanation Education comprehension: verbalized understanding  HOME EXERCISE PROGRAM: Access Code: [email or web address] URL: [email or web address] Date: 12/14/2023 Prepared by: Lang Ada  Exercises - Supine Bridge  - 1 x daily - 7 x weekly - 3 sets - 10 reps - 3 hold - Supine Lower Trunk Rotation  - 1 x daily - 7 x weekly - 3 sets - 10 reps - Supine Piriformis Stretch with Foot on Ground  - 1 x daily - 7 x weekly - 1  sets - 3 reps - 20 hold - Seated Piriformis Stretch  - 1 x daily - 7 x weekly - 1 sets - 3 reps - 20 hold  ASSESSMENT:  CLINICAL IMPRESSION: Patient is a 83 y.o. female who was seen today for physical therapy evaluation and treatment for M54.50,G89.29 (ICD-10-CM) - Chronic right-sided low back pain, unspecified whether sciatica present M53.3,G89.29 (ICD-10-CM) - Chronic right SI joint pain.  Patient demonstrates decreased LE/core strength, abnormal pain rating in low back, and impaired functional mobility and balance. Patient also demonstrates difficulty with ambulation during today's session with decreased stride length, velocity and SOB noted. Patient also demonstrates need for increased time for functional transfers and bed mobility due to generalized weakness. Patient requires education on PT recommendations, prognosis, realistic expectations for therapy and HEP. Patient would benefit from skilled physical therapy for decreased low back pain, increased endurance with ambulation, increased LE/core strength, and balance for improved gait quality, return to higher level of function with ADLs, and progress towards therapy goals.    OBJECTIVE IMPAIRMENTS: Abnormal gait, decreased activity tolerance, decreased balance, decreased endurance, decreased knowledge of use of DME, decreased mobility, difficulty walking, decreased ROM, decreased strength, and pain.   ACTIVITY LIMITATIONS: carrying, lifting, bending, standing, squatting, stairs, transfers, and bed mobility  PARTICIPATION LIMITATIONS: meal prep, cleaning, laundry, driving, shopping, community activity, and yard work  PERSONAL FACTORS: Age, Fitness, Past/current experiences, and Time since onset of injury/illness/exacerbation are also affecting patient's functional outcome.   REHAB POTENTIAL: Fair chronic in nature  CLINICAL DECISION MAKING: Stable/uncomplicated  EVALUATION COMPLEXITY: Low   GOALS: Goals reviewed with patient?  No  SHORT TERM GOALS: Target date: 01/04/24  Pt will be independent with HEP in order to demonstrate participation in Physical Therapy POC.  Baseline: Goal status: INITIAL  2.  Pt will report 6/10 pain with mobility in order to demonstrate improved pain with ADLs.  Baseline: 8/10 Goal status: INITIAL  LONG TERM GOALS: Target date: 01/25/24  Pt will improve 5TSTS by at least 2.3 seconds in order to demonstrate improved functional strength to return to desired activities.  Baseline: see objective.  Goal status: INITIAL  2.  Pt will improve 2 MWT by 50 feet in order to demonstrate improved functional ambulatory capacity in community setting.  Baseline: see objective.  Goal status: INITIAL  3.  Pt will improve Modified Oswestry score by at least 6 points in order to demonstrate improved pain with functional goals and outcomes. Baseline: see objective.  Goal status: INITIAL  4.  Pt will report 4/10 pain with mobility in order to demonstrate reduced pain with ADLs lasting greater than 30 minutes.  Baseline: see objective.  Goal status: INITIAL   PLAN:  PT FREQUENCY: 1-2x/week  PT DURATION: 6 weeks  PLANNED INTERVENTIONS: 97110-Therapeutic exercises, 97530- Therapeutic activity, [id number]- Neuromuscular re-education, 97535- Self  Care, 02859- Manual therapy, [id number]- Gait training, Patient/Family education, Balance training, Stair training, Spinal mobilization, DME instructions, Cryotherapy, and Moist heat.  PLAN FOR NEXT SESSION: progress endurance and strength training for core and LEs, decrease low back pain with functional mobility. Assess balance   Lang Ada, PT, DPT [facility] Office: [phone] 5:50 PM, 2023/12/30   Managed Medicaid Authorization Request Treatment Start Date: 2023-12-30  Visit Dx Codes: M54.59; Z74.09; M53.86  Functional Tool Score: Modified Oswestry 16 / 50 = 32.0 %  For all possible CPT codes, reference the Planned  Interventions line above.     Check all conditions that are expected to impact treatment: {Conditions expected to impact treatment:Musculoskeletal disorders   If treatment provided at initial evaluation, no treatment charged due to lack of authorization.

## 2023-12-16 ENCOUNTER — Ambulatory Visit (HOSPITAL_COMMUNITY)

## 2023-12-16 ENCOUNTER — Encounter (HOSPITAL_COMMUNITY): Payer: Self-pay

## 2023-12-16 DIAGNOSIS — M5386 Other specified dorsopathies, lumbar region: Secondary | ICD-10-CM | POA: Diagnosis not present

## 2023-12-16 DIAGNOSIS — M545 Low back pain, unspecified: Secondary | ICD-10-CM | POA: Diagnosis not present

## 2023-12-16 DIAGNOSIS — M5459 Other low back pain: Secondary | ICD-10-CM | POA: Diagnosis not present

## 2023-12-16 DIAGNOSIS — Z7409 Other reduced mobility: Secondary | ICD-10-CM

## 2023-12-16 DIAGNOSIS — G8929 Other chronic pain: Secondary | ICD-10-CM | POA: Diagnosis not present

## 2023-12-16 DIAGNOSIS — M533 Sacrococcygeal disorders, not elsewhere classified: Secondary | ICD-10-CM | POA: Diagnosis not present

## 2023-12-16 NOTE — Therapy (Signed)
 OUTPATIENT PHYSICAL THERAPY THORACOLUMBAR TREATMENT   Patient Name: [name] MRN: [id number] DOB:[birth year], 83 y.o., female Today's Date: 12/16/2023  END OF SESSION:  PT End of Session - 12/16/23 1244     Visit Number 2    Date for Recertification  01/25/24    Authorization Type [employer] MEDICARE    Authorization Time Period seeking auth    Authorization - Visit Number 1    Progress Note Due on Visit 10    PT Start Time 1245    PT Stop Time 1325    PT Time Calculation (min) 40 min    Activity Tolerance Patient tolerated treatment well;Patient limited by pain    Behavior During Therapy WFL for tasks assessed/performed           Past Medical History:  Diagnosis Date   Allergy    chronic   Alpha galactosidase deficiency    Arthritis    Back pain    Bulging discs    Bursitis    Cataract Several years ago   Removed   DDD (degenerative disc disease)    Dyspnea    with exertion   GERD (gastroesophageal reflux disease)    mild   Glaucoma    H/O removal of cyst    finger (uncertain of which finger)   Hypertension 2021   Migraine headache    Neck pain    PONV (postoperative nausea and vomiting)    Reflux    Seasonal allergies    Stroke (HCC) 10/22/2019   Past Surgical History:  Procedure Laterality Date   ABDOMINAL HYSTERECTOMY     CARPAL TUNNEL RELEASE     bilateral   CATARACT EXTRACTION W/PHACO Left 10/01/2014   Procedure: CATARACT EXTRACTION PHACO AND INTRAOCULAR LENS PLACEMENT LEFT EYE;  Surgeon: Cherene Mania, MD;  Location: [facility];  Service: Ophthalmology;  Laterality: Left;  CDE:8.15   CATARACT EXTRACTION W/PHACO Right 08/02/2017   Procedure: CATARACT EXTRACTION WITH PHACOEMULSIFICATION  AND INTRAOCULAR LENS PLACEMENT AND PLACEMENT OF iSTENT GLAUCOMA DEVICE RIGHT EYE;  Surgeon: Mania Cherene, MD;  Location: [facility];  Service: Ophthalmology;  Laterality: Right;  CDE: 7.77   CERVICAL FUSION     CHOLECYSTECTOMY N/A 05/15/2013   Procedure: LAPAROSCOPIC  CHOLECYSTECTOMY WITH INTRAOPERATIVE CHOLANGIOGRAM;  Surgeon: Oneil DELENA Budge, MD;  Location: [facility];  Service: General;  Laterality: N/A;   CHONDROPLASTY  03/14/2012   Procedure: CHONDROPLASTY;  Surgeon: Taft FORBES Minerva, MD;  Location: [facility];  Service: Orthopedics;  Laterality: Left;  medial condyle   CHONDROPLASTY Right 12/23/2012   Procedure: KNEE ARTHROSCOPY WITH CHONDROPLASTY WITH LIMITED DEBRIDMENT;  Surgeon: Taft FORBES Minerva, MD;  Location: [facility];  Service: Orthopedics;  Laterality: Right;   EYE SURGERY  Cataract both 4 yrs (?)   FOOT SURGERY Bilateral    morton's neuroma   implantable loop recorder placement  10/31/2019    Medtronic Reveal Linq model LNQ 11 (SN [id number]) implantable loop recorder implanted by Dr Kelsie for cryptogenic stroke   KNEE ARTHROSCOPY WITH MEDIAL MENISECTOMY  03/14/2012   Procedure: KNEE ARTHROSCOPY WITH MEDIAL MENISECTOMY;  Surgeon: Taft FORBES Minerva, MD;  Location: [facility];  Service: Orthopedics;  Laterality: Left;   SPINE SURGERY  Neck/plate & screws   TEAR DUCT PROBING Right 09/2020   Tear duct bypass surgery per pt.   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/05/2023   Statin declined 01/27/2022  Prediabetes 03/10/2021   Pure hypercholesterolemia    Cryptogenic stroke (HCC) 10/27/2019   Seasonal allergic rhinitis 02/22/2017   Essential hypertension 07/12/2013   Osteoarthritis, knee 12/03/2011    PCP: Cook, Jayce G, DO  REFERRING PROVIDER: Bluford Jacqulyn MATSU, DO  REFERRING DIAG: [phone] (ICD-10-CM) - Chronic right-sided low back pain, unspecified whether sciatica present M53.3,G89.29 (ICD-10-CM) - Chronic right SI joint pain  Rationale for Evaluation and Treatment: Rehabilitation  THERAPY DIAG:  Other low back pain  Impaired functional mobility, balance, gait, and endurance  Decreased ROM of lumbar spine  ONSET DATE: several  months  SUBJECTIVE:                                                                                                                                                                                           SUBJECTIVE STATEMENT: Pt states she just does not feel good today. Pt states she is not dealing with much back pain today, states she was not sore after first session. Pt reports HEP compliance except for the walking, she has only been walking up and down stairs.   Eval: Pt states back pain has been getting worse for several months. Pt states sciatic problems do occur on occasion, today was the first time in months. Pt states the low back pain keeps her from going to the grand kids ball games, working out in the yard, walk very far, or standing for a long time. Pt states she has been using the cane for long distances for about year.   PERTINENT HISTORY:  Hx of stroke, 2020 no residual effects (perhaps memory issues)  PAIN:  Are you having pain? Yes: NPRS scale: 8/10, probably walking, 0/10 when she is sitting most of the time Pain location: right low back Pain description: sometimes it is a sharp pain, constant ache Aggravating factors: standing, walking, cooking complete meal, chores Relieving factors: sitting down, getting off her feet  PRECAUTIONS: None  RED FLAGS: None   WEIGHT BEARING RESTRICTIONS: No  FALLS:  Has patient fallen in last 6 months? Yes. Number of falls 1, out of rolling chair  LIVING ENVIRONMENT: Lives with: lives alone Lives in: House/apartment Stairs: Yes: Internal: 15 steps; on right going up and External: 4-5 steps; on right going up Has following equipment at home: Single point cane  OCCUPATION: retired, Customer service manager  PLOF: Independent and Independent with basic ADLs  PATIENT GOALS: decrease the back pain, increased functional mobility, and activity tolerance/endurance, improve balance  NEXT MD VISIT: 02/07/24  OBJECTIVE:  Note: Objective  measures were completed at Evaluation unless otherwise noted.  DIAGNOSTIC FINDINGS:  CLINICAL DATA:  SI joint pain.   EXAM: BILATERAL SACROILIAC JOINTS - 3+ VIEW   COMPARISON:  None Available.   FINDINGS: Normal and symmetric sacroiliac joints.   No acute fracture or dislocation.   No aggressive osseous lesion.   Visualized sacral arcuate lines are unremarkable.   Unremarkable symphysis pubis.   There are mild degenerative changes of bilateral hip joints without significant joint space narrowing. Osteophytosis of the superior acetabulum.   No radiopaque foreign bodies.   IMPRESSION: *No acute osseous abnormality of the sacroiliac joints.  CLINICAL DATA:  Low back pain.   EXAM: LUMBAR SPINE - COMPLETE 4+ VIEW   COMPARISON:  07/28/2022.   FINDINGS: There are 5 nonrib-bearing lumbar vertebrae.   Anatomic lumbar curvature.   No spondylolysis.   There is grade 1 anterolisthesis of L5 over S1.   Vertebral body heights are maintained.   No aggressive osseous lesion.   Moderate multilevel degenerative changes in the form of reduced intervertebral disc height, endplate sclerosis/irregularity, facet arthropathy and marginal osteophyte formation.   Sacroiliac joints are symmetric.   Visualized soft tissues are within normal limits.   There are surgical clips in the right upper quadrant, typical of a previous cholecystectomy.   IMPRESSION: No acute osseous abnormality of the lumbar spine. Moderate multilevel degenerative changes.  PATIENT SURVEYS:  Modified Oswestry:   Modified Oswestry 16 / 50 = 32.0 %  COGNITION: Overall cognitive status: pt reports she feels she has to think longer about things since the stroke in 2020.     SENSATION: Not tested  POSTURE: rounded shoulders, forward head, posterior pelvic tilt, and flexed trunk   PALPATION: Pt demonstrates increased tenderness to palpation of lumbar spine and lumbar paraspinals. (L3-L5, most  problematic)  LUMBAR ROM:   AROM eval  Flexion 65  Extension 5  Right lateral flexion 15, discomfort  Left lateral flexion 20  Right rotation 50% available, pain  Left rotation 50% available   (Blank rows = not tested)  LOWER EXTREMITY ROM:     Active  Right eval Left eval  Hip flexion    Hip extension    Hip abduction    Hip adduction    Hip internal rotation    Hip external rotation    Knee flexion    Knee extension    Ankle dorsiflexion    Ankle plantarflexion    Ankle inversion    Ankle eversion     (Blank rows = not tested)  LOWER EXTREMITY MMT:    MMT Right eval Left eval  Hip flexion 3+ 4-  Hip extension 3 3  Hip abduction 3 3+  Hip adduction    Hip internal rotation    Hip external rotation    Knee flexion    Knee extension    Ankle dorsiflexion    Ankle plantarflexion    Ankle inversion    Ankle eversion     (Blank rows = not tested)  LUMBAR SPECIAL TESTS:  Straight leg raise test: Negative  FUNCTIONAL TESTS:  5 times sit to stand: 15.84 seconds no increased pain 2 minute walk test: 235 feet, increased pain from 3/10 to 8/10.  GAIT: Distance walked: 235 feet Assistive device utilized: None Level of assistance: Complete Independence Comments: pt demonstrates decreased gait speed, antalgic pattern and SOB following walk test.  TREATMENT DATE:  12/16/2023  Therapeutic Exercise: -Nustep, 5 minutes, level 3 resistance, pt cued for 70 SPM -Supine bridges 1 sets of 7 reps, 3 second holds, RTB at knees, pt cued  for max hip extension -Standing 3 way hip 1 sets 5 reps, bilaterally, pt cued for upright trunk and maintaining of neutral spine -Lateral stepping 2 laps 20 steps per lap, with RTB around calfs, pt cued for upright posture and decreased dragging of back LE Neuromuscular Re-education: -Modified crunch, 2 sets of 5 reps, 3 second holds, pt cued for increased elevation of shoulders -LTR 1 set of 10 reps bilaterally, pt cued to remain in  pain free ROM -Standing balance, 30 second bouts, narrow BOS, semi tandem, tandem, SLS (bilateral; 6.15 seconds on left; 8.18 seconds on R), pt cued for decreased UE support Therapeutic Activity: -Sit to stands, 2 sets of 10 reps, pt cued for core activation -Lateral step up and overs, 2 sets of 3 reps, 6 inch step, pt cued for upright posture and increased LE stride length  12/14/2023  Evaluation: -ROM measured, Strength assessed, HEP prescribed, pt educated on prognosis, findings, and importance of HEP compliance if given.                                                                                                     PATIENT EDUCATION:  Education details: Pt was educated on findings of PT evaluation, prognosis, frequency of therapy visits and rationale, attendance policy, and HEP if given.   Person educated: Patient Education method: Explanation Education comprehension: verbalized understanding  HOME EXERCISE PROGRAM: Access Code: [email or web address] URL: [email or web address] Date: 12/14/2023 Prepared by: Lang Ada  Exercises - Supine Bridge  - 1 x daily - 7 x weekly - 3 sets - 10 reps - 3 hold - Supine Lower Trunk Rotation  - 1 x daily - 7 x weekly - 3 sets - 10 reps - Supine Piriformis Stretch with Foot on Ground  - 1 x daily - 7 x weekly - 1 sets - 3 reps - 20 hold - Seated Piriformis Stretch  - 1 x daily - 7 x weekly - 1 sets - 3 reps - 20 hold  ASSESSMENT:  CLINICAL IMPRESSION: Patient presents with less low back pain this date, continues to demonstrate decreased LE/core strength, decreased gait quality and balance. Patient also demonstrates fair endurance with aerobic based exercise during today's session on nustep, plan to continue challenge endurance musculature of low back and proximal LE. Patient able to progress dynamic balance and core activation exercises today with crunch variations and step up variations, good performance with verbal cueing. Patient would  continue to benefit from skilled physical therapy for decreased low back pain, increased endurance with ambulation, increased LE/core strength, and improved balance for improved quality of life, improved independence with gait training and continued progress towards therapy goals.   Eval: Patient is a 83 y.o. female who was seen today for physical therapy evaluation and treatment for M54.50,G89.29 (ICD-10-CM) - Chronic right-sided low back pain, unspecified whether sciatica present M53.3,G89.29 (ICD-10-CM) - Chronic right SI joint pain. Patient demonstrates decreased LE/core strength, abnormal pain rating in low back, and impaired functional mobility and balance. Patient also demonstrates difficulty with ambulation during today's session with decreased stride length, velocity and SOB noted.  Patient also demonstrates need for increased time for functional transfers and bed mobility due to generalized weakness. Patient requires education on PT recommendations, prognosis, realistic expectations for therapy and HEP. Patient would benefit from skilled physical therapy for decreased low back pain, increased endurance with ambulation, increased LE/core strength, and balance for improved gait quality, return to higher level of function with ADLs, and progress towards therapy goals.    OBJECTIVE IMPAIRMENTS: Abnormal gait, decreased activity tolerance, decreased balance, decreased endurance, decreased knowledge of use of DME, decreased mobility, difficulty walking, decreased ROM, decreased strength, and pain.   ACTIVITY LIMITATIONS: carrying, lifting, bending, standing, squatting, stairs, transfers, and bed mobility  PARTICIPATION LIMITATIONS: meal prep, cleaning, laundry, driving, shopping, community activity, and yard work  PERSONAL FACTORS: Age, Fitness, Past/current experiences, and Time since onset of injury/illness/exacerbation are also affecting patient's functional outcome.   REHAB POTENTIAL: Fair  chronic in nature  CLINICAL DECISION MAKING: Stable/uncomplicated  EVALUATION COMPLEXITY: Low   GOALS: Goals reviewed with patient? No  SHORT TERM GOALS: Target date: 01/04/24  Pt will be independent with HEP in order to demonstrate participation in Physical Therapy POC.  Baseline: Goal status: INITIAL  2.  Pt will report 6/10 pain with mobility in order to demonstrate improved pain with ADLs.  Baseline: 8/10 Goal status: INITIAL  LONG TERM GOALS: Target date: 01/25/24  Pt will improve 5TSTS by at least 2.3 seconds in order to demonstrate improved functional strength to return to desired activities.  Baseline: see objective.  Goal status: INITIAL  2.  Pt will improve 2 MWT by 50 feet in order to demonstrate improved functional ambulatory capacity in community setting.  Baseline: see objective.  Goal status: INITIAL  3.  Pt will improve Modified Oswestry score by at least 6 points in order to demonstrate improved pain with functional goals and outcomes. Baseline: see objective.  Goal status: INITIAL  4.  Pt will report 4/10 pain with mobility in order to demonstrate reduced pain with ADLs lasting greater than 30 minutes.  Baseline: see objective.  Goal status: INITIAL   PLAN:  PT FREQUENCY: 1-2x/week  PT DURATION: 6 weeks  PLANNED INTERVENTIONS: 97110-Therapeutic exercises, 97530- Therapeutic activity, [id number]- Neuromuscular re-education, 97535- Self Care, [id number]- Manual therapy, [phone]- Gait training, Patient/Family education, Balance training, Stair training, Spinal mobilization, DME instructions, Cryotherapy, and Moist heat.  PLAN FOR NEXT SESSION: progress endurance and strength training for core and LEs, decrease low back pain with functional mobility.    Enora Trillo, PT, DPT [facility] Office: [phone] 1:30 PM, 12/16/23

## 2023-12-23 ENCOUNTER — Ambulatory Visit (HOSPITAL_COMMUNITY): Attending: Family Medicine | Admitting: Physical Therapy

## 2023-12-23 DIAGNOSIS — M5386 Other specified dorsopathies, lumbar region: Secondary | ICD-10-CM | POA: Diagnosis not present

## 2023-12-23 DIAGNOSIS — Z7409 Other reduced mobility: Secondary | ICD-10-CM | POA: Insufficient documentation

## 2023-12-23 DIAGNOSIS — M5459 Other low back pain: Secondary | ICD-10-CM | POA: Insufficient documentation

## 2023-12-23 NOTE — Therapy (Signed)
 OUTPATIENT PHYSICAL THERAPY THORACOLUMBAR TREATMENT   Patient Name: Kerry Richardson MRN: [id number] DOB:Dec 09, 1940, 83 y.o., female Today's Date: 12/23/2023  END OF SESSION:  PT End of Session - 12/23/23 1514     Visit Number 3    Date for Recertification  01/25/24    Authorization Type [employer] MEDICARE    Authorization Time Period seeking auth    Authorization - Visit Number 2    Progress Note Due on Visit 10    PT Start Time 1502    PT Stop Time 1545    PT Time Calculation (min) 43 min    Activity Tolerance Patient tolerated treatment well;Patient limited by pain    Behavior During Therapy WFL for tasks assessed/performed            Past Medical History:  Diagnosis Date   Allergy    chronic   Alpha galactosidase deficiency    Arthritis    Back pain    Bulging discs    Bursitis    Cataract Several years ago   Removed   DDD (degenerative disc disease)    Dyspnea    with exertion   GERD (gastroesophageal reflux disease)    mild   Glaucoma    H/O removal of cyst    finger (uncertain of which finger)   Hypertension 2021   Migraine headache    Neck pain    PONV (postoperative nausea and vomiting)    Reflux    Seasonal allergies    Stroke (HCC) 10/22/2019   Past Surgical History:  Procedure Laterality Date   ABDOMINAL HYSTERECTOMY     CARPAL TUNNEL RELEASE     bilateral   CATARACT EXTRACTION W/PHACO Left 10/01/2014   Procedure: CATARACT EXTRACTION PHACO AND INTRAOCULAR LENS PLACEMENT LEFT EYE;  Surgeon: Cherene Mania, MD;  Location: [facility];  Service: Ophthalmology;  Laterality: Left;  CDE:8.15   CATARACT EXTRACTION W/PHACO Right 08/02/2017   Procedure: CATARACT EXTRACTION WITH PHACOEMULSIFICATION  AND INTRAOCULAR LENS PLACEMENT AND PLACEMENT OF iSTENT GLAUCOMA DEVICE RIGHT EYE;  Surgeon: Mania Cherene, MD;  Location: [facility];  Service: Ophthalmology;  Laterality: Right;  CDE: 7.77   CERVICAL FUSION     CHOLECYSTECTOMY N/A 05/15/2013   Procedure: LAPAROSCOPIC  CHOLECYSTECTOMY WITH INTRAOPERATIVE CHOLANGIOGRAM;  Surgeon: Oneil DELENA Budge, MD;  Location: [facility];  Service: General;  Laterality: N/A;   CHONDROPLASTY  03/14/2012   Procedure: CHONDROPLASTY;  Surgeon: Taft FORBES Minerva, MD;  Location: [facility];  Service: Orthopedics;  Laterality: Left;  medial condyle   CHONDROPLASTY Right 12/23/2012   Procedure: KNEE ARTHROSCOPY WITH CHONDROPLASTY WITH LIMITED DEBRIDMENT;  Surgeon: Taft FORBES Minerva, MD;  Location: [facility];  Service: Orthopedics;  Laterality: Right;   EYE SURGERY  Cataract both 4 yrs (?)   FOOT SURGERY Bilateral    morton's neuroma   implantable loop recorder placement  10/31/2019    Medtronic Reveal Linq model LNQ 11 (SN [id number]) implantable loop recorder implanted by Dr Kelsie for cryptogenic stroke   KNEE ARTHROSCOPY WITH MEDIAL MENISECTOMY  03/14/2012   Procedure: KNEE ARTHROSCOPY WITH MEDIAL MENISECTOMY;  Surgeon: Taft FORBES Minerva, MD;  Location: [facility];  Service: Orthopedics;  Laterality: Left;   SPINE SURGERY  Neck/plate & screws   TEAR DUCT PROBING Right 09/2020   Tear duct bypass surgery per pt.   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/05/2023   Statin declined 01/27/2022  Prediabetes 03/10/2021   Pure hypercholesterolemia    Cryptogenic stroke (HCC) 10/27/2019   Seasonal allergic rhinitis 02/22/2017   Essential hypertension 07/12/2013   Osteoarthritis, knee 12/03/2011    PCP: Cook, Jayce G, DO  REFERRING PROVIDER: Bluford Jacqulyn MATSU, DO  REFERRING DIAG: [phone] (ICD-10-CM) - Chronic right-sided low back pain, unspecified whether sciatica present M53.3,G89.29 (ICD-10-CM) - Chronic right SI joint pain  Rationale for Evaluation and Treatment: Rehabilitation  THERAPY DIAG:  Other low back pain  Impaired functional mobility, balance, gait, and endurance  Decreased ROM of lumbar spine  ONSET DATE: several  months  SUBJECTIVE:                                                                                                                                                                                           SUBJECTIVE STATEMENT: Pt states still having low back pain today. Pt reports HEP compliance but still has not started walking program, just been walking up and down stairs a lot due to house remodel.   Eval: Pt states back pain has been getting worse for several months. Pt states sciatic problems do occur on occasion, today was the first time in months. Pt states the low back pain keeps her from going to the grand kids ball games, working out in the yard, walk very far, or standing for a long time. Pt states she has been using the cane for long distances for about year.   PERTINENT HISTORY:  Hx of stroke, 2020 no residual effects (perhaps memory issues)  PAIN:  Are you having pain? Yes: NPRS scale: 8/10, probably walking, 0/10 when she is sitting most of the time Pain location: right low back Pain description: sometimes it is a sharp pain, constant ache Aggravating factors: standing, walking, cooking complete meal, chores Relieving factors: sitting down, getting off her feet  PRECAUTIONS: None  RED FLAGS: None   WEIGHT BEARING RESTRICTIONS: No  FALLS:  Has patient fallen in last 6 months? Yes. Number of falls 1, out of rolling chair  LIVING ENVIRONMENT: Lives with: lives alone Lives in: House/apartment Stairs: Yes: Internal: 15 steps; on right going up and External: 4-5 steps; on right going up Has following equipment at home: Single point cane  OCCUPATION: retired, Customer service manager  PLOF: Independent and Independent with basic ADLs  PATIENT GOALS: decrease the back pain, increased functional mobility, and activity tolerance/endurance, improve balance  NEXT MD VISIT: 02/07/24  OBJECTIVE:  Note: Objective measures were completed at Evaluation unless otherwise  noted.  DIAGNOSTIC FINDINGS:  CLINICAL DATA:  SI joint pain.   EXAM: BILATERAL SACROILIAC JOINTS - 3+ VIEW  COMPARISON:  None Available.   FINDINGS: Normal and symmetric sacroiliac joints.   No acute fracture or dislocation.   No aggressive osseous lesion.   Visualized sacral arcuate lines are unremarkable.   Unremarkable symphysis pubis.   There are mild degenerative changes of bilateral hip joints without significant joint space narrowing. Osteophytosis of the superior acetabulum.   No radiopaque foreign bodies.   IMPRESSION: *No acute osseous abnormality of the sacroiliac joints.  CLINICAL DATA:  Low back pain.   EXAM: LUMBAR SPINE - COMPLETE 4+ VIEW   COMPARISON:  07/28/2022.   FINDINGS: There are 5 nonrib-bearing lumbar vertebrae.   Anatomic lumbar curvature.   No spondylolysis.   There is grade 1 anterolisthesis of L5 over S1.   Vertebral body heights are maintained.   No aggressive osseous lesion.   Moderate multilevel degenerative changes in the form of reduced intervertebral disc height, endplate sclerosis/irregularity, facet arthropathy and marginal osteophyte formation.   Sacroiliac joints are symmetric.   Visualized soft tissues are within normal limits.   There are surgical clips in the right upper quadrant, typical of a previous cholecystectomy.   IMPRESSION: No acute osseous abnormality of the lumbar spine. Moderate multilevel degenerative changes.  PATIENT SURVEYS:  Modified Oswestry:   Modified Oswestry 16 / 50 = 32.0 %  COGNITION: Overall cognitive status: pt reports she feels she has to think longer about things since the stroke in 2020.     SENSATION: Not tested  POSTURE: rounded shoulders, forward head, posterior pelvic tilt, and flexed trunk   PALPATION: Pt demonstrates increased tenderness to palpation of lumbar spine and lumbar paraspinals. (L3-L5, most problematic)  LUMBAR ROM:   AROM eval  Flexion 65   Extension 5  Right lateral flexion 15, discomfort  Left lateral flexion 20  Right rotation 50% available, pain  Left rotation 50% available   (Blank rows = not tested)  LOWER EXTREMITY ROM:     Active  Right eval Left eval  Hip flexion    Hip extension    Hip abduction    Hip adduction    Hip internal rotation    Hip external rotation    Knee flexion    Knee extension    Ankle dorsiflexion    Ankle plantarflexion    Ankle inversion    Ankle eversion     (Blank rows = not tested)  LOWER EXTREMITY MMT:    MMT Right eval Left eval  Hip flexion 3+ 4-  Hip extension 3 3  Hip abduction 3 3+  Hip adduction    Hip internal rotation    Hip external rotation    Knee flexion    Knee extension    Ankle dorsiflexion    Ankle plantarflexion    Ankle inversion    Ankle eversion     (Blank rows = not tested)  LUMBAR SPECIAL TESTS:  Straight leg raise test: Negative  FUNCTIONAL TESTS:  5 times sit to stand: 15.84 seconds no increased pain 2 minute walk test: 235 feet, increased pain from 3/10 to 8/10.  GAIT: Distance walked: 235 feet Assistive device utilized: None Level of assistance: Complete Independence Comments: pt demonstrates decreased gait speed, antalgic pattern and SOB following walk test.  TREATMENT DATE:  12/23/23 Nustep, 5 minutes, level 3 resistance, UE/LE seat 8 Standing:  in // bars with bil UE assist Vectors, 3 way hip 5X5 each  Hip abduction 10X each Hip extension 10X each Lunges 10X each onto 4 step no UE Tandem stance  30 each side intermittent HHA Sit to stand No UE's 10X    12/16/2023  Therapeutic Exercise: -Nustep, 5 minutes, level 3 resistance, pt cued for 70 SPM -Supine bridges 1 sets of 7 reps, 3 second holds, RTB at knees, pt cued for max hip extension -Standing 3 way hip 1 sets 5 reps, bilaterally, pt cued for upright trunk and maintaining of neutral spine -Lateral stepping 2 laps 20 steps per lap, with RTB around calfs, pt cued  for upright posture and decreased dragging of back LE Neuromuscular Re-education: -Modified crunch, 2 sets of 5 reps, 3 second holds, pt cued for increased elevation of shoulders -LTR 1 set of 10 reps bilaterally, pt cued to remain in pain free ROM -Standing balance, 30 second bouts, narrow BOS, semi tandem, tandem, SLS (bilateral; 6.15 seconds on left; 8.18 seconds on R), pt cued for decreased UE support Therapeutic Activity: -Sit to stands, 2 sets of 10 reps, pt cued for core activation -Lateral step up and overs, 2 sets of 3 reps, 6 inch step, pt cued for upright posture and increased LE stride length  12/14/2023  Evaluation: -ROM measured, Strength assessed, HEP prescribed, pt educated on prognosis, findings, and importance of HEP compliance if given.                                                                                                     PATIENT EDUCATION:  Education details: Pt was educated on findings of PT evaluation, prognosis, frequency of therapy visits and rationale, attendance policy, and HEP if given.   Person educated: Patient Education method: Explanation Education comprehension: verbalized understanding  HOME EXERCISE PROGRAM: Access Code: [email or web address] URL: [email or web address] Date: 12/14/2023 Prepared by: Lang Ada  Exercises - Supine Bridge  - 1 x daily - 7 x weekly - 3 sets - 10 reps - 3 hold - Supine Lower Trunk Rotation  - 1 x daily - 7 x weekly - 3 sets - 10 reps - Supine Piriformis Stretch with Foot on Ground  - 1 x daily - 7 x weekly - 1 sets - 3 reps - 20 hold - Seated Piriformis Stretch  - 1 x daily - 7 x weekly - 1 sets - 3 reps - 20 hold  ASSESSMENT:  CLINICAL IMPRESSION: Began with warm up on nustep today.  Focus remains on LE strength and stabilization exercises.  Pt educated on meaning of diagnosis given in xray report and how treatment indicates physical therapy. Added standing hip abd/ext strength and lunges for  quadricep.  Pt able to complete forward lunges onto 4 surface without UE assist and with good control. Static Balance activities began with tandem stance, able to maintain 30 sec with intermittent UE assist. Pt with low endurance/activity tolerance indicated by SOB on nustep and frequent rest breaks needed during session today.  Patient will continue to benefit from skilled physical therapy for decreased low back pain, increased endurance with ambulation, increased LE/core strength, and improved balance for improved quality of life, improved independence with gait training and continued progress towards therapy goals.   Eval: Patient is  a 83 y.o. female who was seen today for physical therapy evaluation and treatment for M54.50,G89.29 (ICD-10-CM) - Chronic right-sided low back pain, unspecified whether sciatica present M53.3,G89.29 (ICD-10-CM) - Chronic right SI joint pain. Patient demonstrates decreased LE/core strength, abnormal pain rating in low back, and impaired functional mobility and balance. Patient also demonstrates difficulty with ambulation during today's session with decreased stride length, velocity and SOB noted. Patient also demonstrates need for increased time for functional transfers and bed mobility due to generalized weakness. Patient requires education on PT recommendations, prognosis, realistic expectations for therapy and HEP. Patient would benefit from skilled physical therapy for decreased low back pain, increased endurance with ambulation, increased LE/core strength, and balance for improved gait quality, return to higher level of function with ADLs, and progress towards therapy goals.    OBJECTIVE IMPAIRMENTS: Abnormal gait, decreased activity tolerance, decreased balance, decreased endurance, decreased knowledge of use of DME, decreased mobility, difficulty walking, decreased ROM, decreased strength, and pain.   ACTIVITY LIMITATIONS: carrying, lifting, bending, standing,  squatting, stairs, transfers, and bed mobility  PARTICIPATION LIMITATIONS: meal prep, cleaning, laundry, driving, shopping, community activity, and yard work  PERSONAL FACTORS: Age, Fitness, Past/current experiences, and Time since onset of injury/illness/exacerbation are also affecting patient's functional outcome.   REHAB POTENTIAL: Fair chronic in nature  CLINICAL DECISION MAKING: Stable/uncomplicated  EVALUATION COMPLEXITY: Low   GOALS: Goals reviewed with patient? No  SHORT TERM GOALS: Target date: 01/04/24  Pt will be independent with HEP in order to demonstrate participation in Physical Therapy POC.  Baseline: Goal status: INITIAL  2.  Pt will report 6/10 pain with mobility in order to demonstrate improved pain with ADLs.  Baseline: 8/10 Goal status: INITIAL  LONG TERM GOALS: Target date: 01/25/24  Pt will improve 5TSTS by at least 2.3 seconds in order to demonstrate improved functional strength to return to desired activities.  Baseline: see objective.  Goal status: INITIAL  2.  Pt will improve 2 MWT by 50 feet in order to demonstrate improved functional ambulatory capacity in community setting.  Baseline: see objective.  Goal status: INITIAL  3.  Pt will improve Modified Oswestry score by at least 6 points in order to demonstrate improved pain with functional goals and outcomes. Baseline: see objective.  Goal status: INITIAL  4.  Pt will report 4/10 pain with mobility in order to demonstrate reduced pain with ADLs lasting greater than 30 minutes.  Baseline: see objective.  Goal status: INITIAL   PLAN:  PT FREQUENCY: 1-2x/week  PT DURATION: 6 weeks  PLANNED INTERVENTIONS: 97110-Therapeutic exercises, 97530- Therapeutic activity, [id number]- Neuromuscular re-education, 97535- Self Care, [id number]- Manual therapy, [phone]- Gait training, Patient/Family education, Balance training, Stair training, Spinal mobilization, DME instructions, Cryotherapy, and Moist heat.  PLAN  FOR NEXT SESSION: progress endurance and strength training for core and LEs, decrease low back pain with functional mobility.    Lang Ada, PT, DPT [facility] Office: [phone] 4:09 PM, 12/23/23

## 2023-12-30 ENCOUNTER — Encounter (HOSPITAL_COMMUNITY): Payer: Self-pay

## 2023-12-30 ENCOUNTER — Ambulatory Visit (HOSPITAL_COMMUNITY)

## 2023-12-30 DIAGNOSIS — Z7409 Other reduced mobility: Secondary | ICD-10-CM

## 2023-12-30 DIAGNOSIS — M5386 Other specified dorsopathies, lumbar region: Secondary | ICD-10-CM

## 2023-12-30 DIAGNOSIS — M5459 Other low back pain: Secondary | ICD-10-CM | POA: Diagnosis not present

## 2023-12-30 NOTE — Therapy (Signed)
 OUTPATIENT PHYSICAL THERAPY THORACOLUMBAR TREATMENT   Patient Name: Kerry Richardson MRN: [id number] DOB:[birth year], 83 y.o., female Today's Date: 12/30/2023  END OF SESSION:  PT End of Session - 12/30/23 1501     Visit Number 4    Number of Visits 12    Date for Recertification  01/25/24    Authorization Type [employer] MEDICARE    Authorization Time Period seeking auth    Authorization - Visit Number 3    Progress Note Due on Visit 10    PT Start Time 1504    PT Stop Time 1542    PT Time Calculation (min) 38 min    Activity Tolerance Patient tolerated treatment well;Patient limited by pain;No increased pain;Patient limited by fatigue    Behavior During Therapy [facility] for tasks assessed/performed            Past Medical History:  Diagnosis Date   Allergy    chronic   Alpha galactosidase deficiency    Arthritis    Back pain    Bulging discs    Bursitis    Cataract Several years ago   Removed   DDD (degenerative disc disease)    Dyspnea    with exertion   GERD (gastroesophageal reflux disease)    mild   Glaucoma    H/O removal of cyst    finger (uncertain of which finger)   Hypertension 2021   Migraine headache    Neck pain    PONV (postoperative nausea and vomiting)    Reflux    Seasonal allergies    Stroke (HCC) 10/22/2019   Past Surgical History:  Procedure Laterality Date   ABDOMINAL HYSTERECTOMY     CARPAL TUNNEL RELEASE     bilateral   CATARACT EXTRACTION W/PHACO Left 10/01/2014   Procedure: CATARACT EXTRACTION PHACO AND INTRAOCULAR LENS PLACEMENT LEFT EYE;  Surgeon: Cherene Mania, MD;  Location: [facility];  Service: Ophthalmology;  Laterality: Left;  CDE:8.15   CATARACT EXTRACTION W/PHACO Right 08/02/2017   Procedure: CATARACT EXTRACTION WITH PHACOEMULSIFICATION  AND INTRAOCULAR LENS PLACEMENT AND PLACEMENT OF iSTENT GLAUCOMA DEVICE RIGHT EYE;  Surgeon: Mania Cherene, MD;  Location: [facility];  Service: Ophthalmology;  Laterality: Right;  CDE: 7.77   CERVICAL FUSION      CHOLECYSTECTOMY N/A 05/15/2013   Procedure: LAPAROSCOPIC CHOLECYSTECTOMY WITH INTRAOPERATIVE CHOLANGIOGRAM;  Surgeon: Oneil DELENA Budge, MD;  Location: [facility];  Service: General;  Laterality: N/A;   CHONDROPLASTY  03/14/2012   Procedure: CHONDROPLASTY;  Surgeon: Taft FORBES Minerva, MD;  Location: [facility];  Service: Orthopedics;  Laterality: Left;  medial condyle   CHONDROPLASTY Right 12/23/2012   Procedure: KNEE ARTHROSCOPY WITH CHONDROPLASTY WITH LIMITED DEBRIDMENT;  Surgeon: Taft FORBES Minerva, MD;  Location: [facility];  Service: Orthopedics;  Laterality: Right;   EYE SURGERY  Cataract both 4 yrs (?)   FOOT SURGERY Bilateral    morton's neuroma   implantable loop recorder placement  10/31/2019    Medtronic Reveal Linq model LNQ 11 (SN [id number]) implantable loop recorder implanted by Dr Kelsie for cryptogenic stroke   KNEE ARTHROSCOPY WITH MEDIAL MENISECTOMY  03/14/2012   Procedure: KNEE ARTHROSCOPY WITH MEDIAL MENISECTOMY;  Surgeon: Taft FORBES Minerva, MD;  Location: [facility];  Service: Orthopedics;  Laterality: Left;   SPINE SURGERY  Neck/plate & screws   TEAR DUCT PROBING Right 09/2020   Tear duct bypass surgery per pt.   Patient Active Problem List   Diagnosis Date Noted   Chronic right-sided low back pain 11/05/2023  Chronic right SI joint pain 11/05/2023   Statin declined 01/27/2022   Prediabetes 03/10/2021   Pure hypercholesterolemia    Cryptogenic stroke (HCC) 10/27/2019   Seasonal allergic rhinitis 02/22/2017   Essential hypertension 07/12/2013   Osteoarthritis, knee 12/03/2011    PCP: Cook, Jayce G, DO  REFERRING PROVIDER: Bluford Jacqulyn MATSU, DO  REFERRING DIAG: [id number] (ICD-10-CM) - Chronic right-sided low back pain, unspecified whether sciatica present M53.3,G89.29 (ICD-10-CM) - Chronic right SI joint pain  Rationale for Evaluation and Treatment: Rehabilitation  THERAPY DIAG:  Other low back pain  Impaired functional mobility, balance, gait, and  endurance  Decreased ROM of lumbar spine  ONSET DATE: several months  SUBJECTIVE:                                                                                                                                                                                           SUBJECTIVE STATEMENT: Increased pain upon standing, pain scale goes up to 8/10.  HEP compliance daily, has not started walking program yet.  Stated she has been walking a lot due to house remodeling.    Eval: Pt states back pain has been getting worse for several months. Pt states sciatic problems do occur on occasion, today was the first time in months. Pt states the low back pain keeps her from going to the grand kids ball games, working out in the yard, walk very far, or standing for a long time. Pt states she has been using the cane for long distances for about year.   PERTINENT HISTORY:  Hx of stroke, 2020 no residual effects (perhaps memory issues)  PAIN:  Are you having pain? Yes: NPRS scale: 8/10, probably walking, 0/10 when she is sitting most of the time Pain location: right low back Pain description: sometimes it is a sharp pain, constant ache Aggravating factors: standing, walking, cooking complete meal, chores Relieving factors: sitting down, getting off her feet  PRECAUTIONS: None  RED FLAGS: None   WEIGHT BEARING RESTRICTIONS: No  FALLS:  Has patient fallen in last 6 months? Yes. Number of falls 1, out of rolling chair  LIVING ENVIRONMENT: Lives with: lives alone Lives in: House/apartment Stairs: Yes: Internal: 15 steps; on right going up and External: 4-5 steps; on right going up Has following equipment at home: Single point cane  OCCUPATION: retired, Customer service manager  PLOF: Independent and Independent with basic ADLs  PATIENT GOALS: decrease the back pain, increased functional mobility, and activity tolerance/endurance, improve balance  NEXT MD VISIT: 02/07/24  OBJECTIVE:  Note: Objective  measures were completed at Evaluation unless otherwise noted.  DIAGNOSTIC FINDINGS:  CLINICAL DATA:  SI joint pain.   EXAM: BILATERAL SACROILIAC JOINTS - 3+ VIEW   COMPARISON:  None Available.   FINDINGS: Normal and symmetric sacroiliac joints.   No acute fracture or dislocation.   No aggressive osseous lesion.   Visualized sacral arcuate lines are unremarkable.   Unremarkable symphysis pubis.   There are mild degenerative changes of bilateral hip joints without significant joint space narrowing. Osteophytosis of the superior acetabulum.   No radiopaque foreign bodies.   IMPRESSION: *No acute osseous abnormality of the sacroiliac joints.  CLINICAL DATA:  Low back pain.   EXAM: LUMBAR SPINE - COMPLETE 4+ VIEW   COMPARISON:  07/28/2022.   FINDINGS: There are 5 nonrib-bearing lumbar vertebrae.   Anatomic lumbar curvature.   No spondylolysis.   There is grade 1 anterolisthesis of L5 over S1.   Vertebral body heights are maintained.   No aggressive osseous lesion.   Moderate multilevel degenerative changes in the form of reduced intervertebral disc height, endplate sclerosis/irregularity, facet arthropathy and marginal osteophyte formation.   Sacroiliac joints are symmetric.   Visualized soft tissues are within normal limits.   There are surgical clips in the right upper quadrant, typical of a previous cholecystectomy.   IMPRESSION: No acute osseous abnormality of the lumbar spine. Moderate multilevel degenerative changes.  PATIENT SURVEYS:  Modified Oswestry:   Modified Oswestry 16 / 50 = 32.0 %  COGNITION: Overall cognitive status: pt reports she feels she has to think longer about things since the stroke in 2020.     SENSATION: Not tested  POSTURE: rounded shoulders, forward head, posterior pelvic tilt, and flexed trunk   PALPATION: Pt demonstrates increased tenderness to palpation of lumbar spine and lumbar paraspinals. (L3-L5, most  problematic)  LUMBAR ROM:   AROM eval  Flexion 65  Extension 5  Right lateral flexion 15, discomfort  Left lateral flexion 20  Right rotation 50% available, pain  Left rotation 50% available   (Blank rows = not tested)  LOWER EXTREMITY ROM:     Active  Right eval Left eval  Hip flexion    Hip extension    Hip abduction    Hip adduction    Hip internal rotation    Hip external rotation    Knee flexion    Knee extension    Ankle dorsiflexion    Ankle plantarflexion    Ankle inversion    Ankle eversion     (Blank rows = not tested)  LOWER EXTREMITY MMT:    MMT Right eval Left eval  Hip flexion 3+ 4-  Hip extension 3 3  Hip abduction 3 3+  Hip adduction    Hip internal rotation    Hip external rotation    Knee flexion    Knee extension    Ankle dorsiflexion    Ankle plantarflexion    Ankle inversion    Ankle eversion     (Blank rows = not tested)  LUMBAR SPECIAL TESTS:  Straight leg raise test: Negative  FUNCTIONAL TESTS:  5 times sit to stand: 15.84 seconds no increased pain 2 minute walk test: 235 feet, increased pain from 3/10 to 8/10.  GAIT: Distance walked: 235 feet Assistive device utilized: None Level of assistance: Complete Independence Comments: pt demonstrates decreased gait speed, antalgic pattern and SOB following walk test.  TREATMENT DATE:  12/30/23: Nustep, 5 minutes, level 3 resistance, UE/LE seat 8 [place], average SPM 65 STS no UE 10x eccentric control Standing:  in // bars with intermittent UE assist  Toe tapping on 6in alternating with intermittent HHA  Lunges on 6in step height with 1 UE assist  Sidestep with RTB around thighs, 3RT with no HHA  Tandem stance 2x 30  SLS Lt 4, Rt 12 max of 3  Vector stance 5x5 with UE assist Seated piriformis stretch 2x 30  12/23/23 Nustep, 5 minutes, level 3 resistance, UE/LE seat 8 Standing:  in // bars with bil UE assist Vectors, 3 way hip 5X5 each  Hip abduction 10X  each Hip extension 10X each Lunges 10X each onto 4 step no UE Tandem stance 30 each side intermittent HHA Sit to stand No UE's 10X    12/16/2023  Therapeutic Exercise: -Nustep, 5 minutes, level 3 resistance, pt cued for 70 SPM -Supine bridges 1 sets of 7 reps, 3 second holds, RTB at knees, pt cued for max hip extension -Standing 3 way hip 1 sets 5 reps, bilaterally, pt cued for upright trunk and maintaining of neutral spine -Lateral stepping 2 laps 20 steps per lap, with RTB around calfs, pt cued for upright posture and decreased dragging of back LE Neuromuscular Re-education: -Modified crunch, 2 sets of 5 reps, 3 second holds, pt cued for increased elevation of shoulders -LTR 1 set of 10 reps bilaterally, pt cued to remain in pain free ROM -Standing balance, 30 second bouts, narrow BOS, semi tandem, tandem, SLS (bilateral; 6.15 seconds on left; 8.18 seconds on R), pt cued for decreased UE support Therapeutic Activity: -Sit to stands, 2 sets of 10 reps, pt cued for core activation -Lateral step up and overs, 2 sets of 3 reps, 6 inch step, pt cued for upright posture and increased LE stride length                                                                                                 PATIENT EDUCATION:  Education details: Pt was educated on findings of PT evaluation, prognosis, frequency of therapy visits and rationale, attendance policy, and HEP if given.   Person educated: Patient Education method: Explanation Education comprehension: verbalized understanding  HOME EXERCISE PROGRAM: Access Code: [email or web address] URL: [email or web address] Date: 12/14/2023 Prepared by: Lang Ada  Exercises - Supine Bridge  - 1 x daily - 7 x weekly - 3 sets - 10 reps - 3 hold - Supine Lower Trunk Rotation  - 1 x daily - 7 x weekly - 3 sets - 10 reps - Supine Piriformis Stretch with Foot on Ground  - 1 x daily - 7 x weekly - 1 sets - 3 reps - 20 hold - Seated Piriformis  Stretch  - 1 x daily - 7 x weekly - 1 sets - 3 reps - 20 hold  - Sit to Stand  - 1 x daily - 7 x weekly - 2 sets - 10 reps - Standing Hip Abduction with Counter Support  - 1 x daily - 7 x weekly - 2 sets - 10 reps - Standing March with Counter Support  - 1 x daily - 7 x weekly - 2 sets - 10 reps ASSESSMENT:  CLINICAL IMPRESSION: Began session on Nustep  for dynamic warm up, able to keep average SPM at 65.  Reviewed benefits of walking program for activity tolerance, pt limited by pain during standing and has not began program yet, has been busy around the house due to remodeling.  Session focus with LE strengthening, hip stability and balance activities with cueing for core activation for lumbar support.  Pt required intermittent seated rest breaks through session due to fatigue.  No reports of increased pain through session.  Added balance activities with intermittent HHA require and cueing for posture to assist.     Eval: Patient is a 83 y.o. female who was seen today for physical therapy evaluation and treatment for M54.50,G89.29 (ICD-10-CM) - Chronic right-sided low back pain, unspecified whether sciatica present M53.3,G89.29 (ICD-10-CM) - Chronic right SI joint pain. Patient demonstrates decreased LE/core strength, abnormal pain rating in low back, and impaired functional mobility and balance. Patient also demonstrates difficulty with ambulation during today's session with decreased stride length, velocity and SOB noted. Patient also demonstrates need for increased time for functional transfers and bed mobility due to generalized weakness. Patient requires education on PT recommendations, prognosis, realistic expectations for therapy and HEP. Patient would benefit from skilled physical therapy for decreased low back pain, increased endurance with ambulation, increased LE/core strength, and balance for improved gait quality, return to higher level of function with ADLs, and progress towards therapy  goals.    OBJECTIVE IMPAIRMENTS: Abnormal gait, decreased activity tolerance, decreased balance, decreased endurance, decreased knowledge of use of DME, decreased mobility, difficulty walking, decreased ROM, decreased strength, and pain.   ACTIVITY LIMITATIONS: carrying, lifting, bending, standing, squatting, stairs, transfers, and bed mobility  PARTICIPATION LIMITATIONS: meal prep, cleaning, laundry, driving, shopping, community activity, and yard work  PERSONAL FACTORS: Age, Fitness, Past/current experiences, and Time since onset of injury/illness/exacerbation are also affecting patient's functional outcome.   REHAB POTENTIAL: Fair chronic in nature  CLINICAL DECISION MAKING: Stable/uncomplicated  EVALUATION COMPLEXITY: Low   GOALS: Goals reviewed with patient? No  SHORT TERM GOALS: Target date: 01/04/24  Pt will be independent with HEP in order to demonstrate participation in Physical Therapy POC.  Baseline: Goal status: INITIAL  2.  Pt will report 6/10 pain with mobility in order to demonstrate improved pain with ADLs.  Baseline: 8/10 Goal status: INITIAL  LONG TERM GOALS: Target date: 01/25/24  Pt will improve 5TSTS by at least 2.3 seconds in order to demonstrate improved functional strength to return to desired activities.  Baseline: see objective.  Goal status: INITIAL  2.  Pt will improve 2 MWT by 50 feet in order to demonstrate improved functional ambulatory capacity in community setting.  Baseline: see objective.  Goal status: INITIAL  3.  Pt will improve Modified Oswestry score by at least 6 points in order to demonstrate improved pain with functional goals and outcomes. Baseline: see objective.  Goal status: INITIAL  4.  Pt will report 4/10 pain with mobility in order to demonstrate reduced pain with ADLs lasting greater than 30 minutes.  Baseline: see objective.  Goal status: INITIAL   PLAN:  PT FREQUENCY: 1-2x/week  PT DURATION: 6 weeks  PLANNED  INTERVENTIONS: 97110-Therapeutic exercises, 97530- Therapeutic activity, [id number]- Neuromuscular re-education, 97535- Self Care, [id number]- Manual therapy, [phone]- Gait training, Patient/Family education, Balance training, Stair training, Spinal mobilization, DME instructions, Cryotherapy, and Moist heat.  PLAN FOR NEXT SESSION: progress endurance and strength training for core and LEs, decrease low back pain with functional mobility.    Augustin Mclean, LPTA/CLT; [facility] [phone]  4:50 PM, 12/30/23

## 2024-01-07 ENCOUNTER — Encounter (HOSPITAL_COMMUNITY): Payer: Self-pay

## 2024-01-07 ENCOUNTER — Ambulatory Visit (HOSPITAL_COMMUNITY)

## 2024-01-07 DIAGNOSIS — Z7409 Other reduced mobility: Secondary | ICD-10-CM

## 2024-01-07 DIAGNOSIS — M5459 Other low back pain: Secondary | ICD-10-CM

## 2024-01-07 DIAGNOSIS — M5386 Other specified dorsopathies, lumbar region: Secondary | ICD-10-CM

## 2024-01-07 NOTE — Therapy (Addendum)
 OUTPATIENT PHYSICAL THERAPY THORACOLUMBAR TREATMENT   Patient Name: Kerry Richardson MRN: [id number] DOB:[birth year], 83 y.o., female Today's Date: 01/07/2024  END OF SESSION:  PT End of Session - 01/07/24 1549     Visit Number 5    Number of Visits 12    Date for Recertification  01/25/24    Authorization Type [employer] MEDICARE    Authorization Time Period seeking auth    Authorization - Visit Number 4    Progress Note Due on Visit 10    PT Start Time 1549    PT Stop Time 1627    PT Time Calculation (min) 38 min    Activity Tolerance Patient tolerated treatment well;Patient limited by pain;No increased pain;Patient limited by fatigue    Behavior During Therapy [facility] for tasks assessed/performed             Past Medical History:  Diagnosis Date   Allergy    chronic   Alpha galactosidase deficiency    Arthritis    Back pain    Bulging discs    Bursitis    Cataract Several years ago   Removed   DDD (degenerative disc disease)    Dyspnea    with exertion   GERD (gastroesophageal reflux disease)    mild   Glaucoma    H/O removal of cyst    finger (uncertain of which finger)   Hypertension 2021   Migraine headache    Neck pain    PONV (postoperative nausea and vomiting)    Reflux    Seasonal allergies    Stroke (HCC) 10/22/2019   Past Surgical History:  Procedure Laterality Date   ABDOMINAL HYSTERECTOMY     CARPAL TUNNEL RELEASE     bilateral   CATARACT EXTRACTION W/PHACO Left 10/01/2014   Procedure: CATARACT EXTRACTION PHACO AND INTRAOCULAR LENS PLACEMENT LEFT EYE;  Surgeon: Cherene Mania, MD;  Location: [facility];  Service: Ophthalmology;  Laterality: Left;  CDE:8.15   CATARACT EXTRACTION W/PHACO Right 08/02/2017   Procedure: CATARACT EXTRACTION WITH PHACOEMULSIFICATION  AND INTRAOCULAR LENS PLACEMENT AND PLACEMENT OF iSTENT GLAUCOMA DEVICE RIGHT EYE;  Surgeon: Mania Cherene, MD;  Location: [facility];  Service: Ophthalmology;  Laterality: Right;  CDE: 7.77   CERVICAL FUSION      CHOLECYSTECTOMY N/A 05/15/2013   Procedure: LAPAROSCOPIC CHOLECYSTECTOMY WITH INTRAOPERATIVE CHOLANGIOGRAM;  Surgeon: Oneil DELENA Budge, MD;  Location: [facility];  Service: General;  Laterality: N/A;   CHONDROPLASTY  03/14/2012   Procedure: CHONDROPLASTY;  Surgeon: Taft FORBES Minerva, MD;  Location: [facility];  Service: Orthopedics;  Laterality: Left;  medial condyle   CHONDROPLASTY Right 12/23/2012   Procedure: KNEE ARTHROSCOPY WITH CHONDROPLASTY WITH LIMITED DEBRIDMENT;  Surgeon: Taft FORBES Minerva, MD;  Location: [facility];  Service: Orthopedics;  Laterality: Right;   EYE SURGERY  Cataract both 4 yrs (?)   FOOT SURGERY Bilateral    morton's neuroma   implantable loop recorder placement  10/31/2019    Medtronic Reveal Linq model LNQ 11 (SN [id number]) implantable loop recorder implanted by Dr Kelsie for cryptogenic stroke   KNEE ARTHROSCOPY WITH MEDIAL MENISECTOMY  03/14/2012   Procedure: KNEE ARTHROSCOPY WITH MEDIAL MENISECTOMY;  Surgeon: Taft FORBES Minerva, MD;  Location: [facility];  Service: Orthopedics;  Laterality: Left;   SPINE SURGERY  Neck/plate & screws   TEAR DUCT PROBING Right 09/2020   Tear duct bypass surgery per pt.   Patient Active Problem List   Diagnosis Date Noted   Chronic right-sided low back pain 11/05/2023  Chronic right SI joint pain 11/05/2023   Statin declined 01/27/2022   Prediabetes 03/10/2021   Pure hypercholesterolemia    Cryptogenic stroke (HCC) 10/27/2019   Seasonal allergic rhinitis 02/22/2017   Essential hypertension 07/12/2013   Osteoarthritis, knee 12/03/2011    PCP: Cook, Jayce G, DO  REFERRING PROVIDER: Bluford Jacqulyn MATSU, DO  REFERRING DIAG: [phone] (ICD-10-CM) - Chronic right-sided low back pain, unspecified whether sciatica present M53.3,G89.29 (ICD-10-CM) - Chronic right SI joint pain  Rationale for Evaluation and Treatment: Rehabilitation  THERAPY DIAG:  Other low back pain  Impaired functional mobility, balance, gait, and  endurance  Decreased ROM of lumbar spine  ONSET DATE: several months  SUBJECTIVE:                                                                                                                                                                                           SUBJECTIVE STATEMENT: Pt states she is beyond tired. Pt states the back pain is not that bad today but is still bothering her with walking. Pt is having remodeling done at the house and has been having to take her baths upstairs.   Eval: Pt states back pain has been getting worse for several months. Pt states sciatic problems do occur on occasion, today was the first time in months. Pt states the low back pain keeps her from going to the grand kids ball games, working out in the yard, walk very far, or standing for a long time. Pt states she has been using the cane for long distances for about year.   PERTINENT HISTORY:  Hx of stroke, 2020 no residual effects (perhaps memory issues)  PAIN:  Are you having pain? Yes: NPRS scale: 8/10, probably walking, 0/10 when she is sitting most of the time Pain location: right low back Pain description: sometimes it is a sharp pain, constant ache Aggravating factors: standing, walking, cooking complete meal, chores Relieving factors: sitting down, getting off her feet  PRECAUTIONS: None  RED FLAGS: None   WEIGHT BEARING RESTRICTIONS: No  FALLS:  Has patient fallen in last 6 months? Yes. Number of falls 1, out of rolling chair  LIVING ENVIRONMENT: Lives with: lives alone Lives in: House/apartment Stairs: Yes: Internal: 15 steps; on right going up and External: 4-5 steps; on right going up Has following equipment at home: Single point cane  OCCUPATION: retired, Customer service manager  PLOF: Independent and Independent with basic ADLs  PATIENT GOALS: decrease the back pain, increased functional mobility, and activity tolerance/endurance, improve balance  NEXT MD VISIT:  02/07/24  OBJECTIVE:  Note: Objective measures were completed at Evaluation unless otherwise  noted.  DIAGNOSTIC FINDINGS:  CLINICAL DATA:  SI joint pain.   EXAM: BILATERAL SACROILIAC JOINTS - 3+ VIEW   COMPARISON:  None Available.   FINDINGS: Normal and symmetric sacroiliac joints.   No acute fracture or dislocation.   No aggressive osseous lesion.   Visualized sacral arcuate lines are unremarkable.   Unremarkable symphysis pubis.   There are mild degenerative changes of bilateral hip joints without significant joint space narrowing. Osteophytosis of the superior acetabulum.   No radiopaque foreign bodies.   IMPRESSION: *No acute osseous abnormality of the sacroiliac joints.  CLINICAL DATA:  Low back pain.   EXAM: LUMBAR SPINE - COMPLETE 4+ VIEW   COMPARISON:  07/28/2022.   FINDINGS: There are 5 nonrib-bearing lumbar vertebrae.   Anatomic lumbar curvature.   No spondylolysis.   There is grade 1 anterolisthesis of L5 over S1.   Vertebral body heights are maintained.   No aggressive osseous lesion.   Moderate multilevel degenerative changes in the form of reduced intervertebral disc height, endplate sclerosis/irregularity, facet arthropathy and marginal osteophyte formation.   Sacroiliac joints are symmetric.   Visualized soft tissues are within normal limits.   There are surgical clips in the right upper quadrant, typical of a previous cholecystectomy.   IMPRESSION: No acute osseous abnormality of the lumbar spine. Moderate multilevel degenerative changes.  PATIENT SURVEYS:  Modified Oswestry:   Modified Oswestry 16 / 50 = 32.0 %  COGNITION: Overall cognitive status: pt reports she feels she has to think longer about things since the stroke in 2020.     SENSATION: Not tested  POSTURE: rounded shoulders, forward head, posterior pelvic tilt, and flexed trunk   PALPATION: Pt demonstrates increased tenderness to palpation of lumbar spine and  lumbar paraspinals. (L3-L5, most problematic)  LUMBAR ROM:   AROM eval  Flexion 65  Extension 5  Right lateral flexion 15, discomfort  Left lateral flexion 20  Right rotation 50% available, pain  Left rotation 50% available   (Blank rows = not tested)  LOWER EXTREMITY ROM:     Active  Right eval Left eval  Hip flexion    Hip extension    Hip abduction    Hip adduction    Hip internal rotation    Hip external rotation    Knee flexion    Knee extension    Ankle dorsiflexion    Ankle plantarflexion    Ankle inversion    Ankle eversion     (Blank rows = not tested)  LOWER EXTREMITY MMT:    MMT Right eval Left eval  Hip flexion 3+ 4-  Hip extension 3 3  Hip abduction 3 3+  Hip adduction    Hip internal rotation    Hip external rotation    Knee flexion    Knee extension    Ankle dorsiflexion    Ankle plantarflexion    Ankle inversion    Ankle eversion     (Blank rows = not tested)  LUMBAR SPECIAL TESTS:  Straight leg raise test: Negative  FUNCTIONAL TESTS:  5 times sit to stand: 15.84 seconds no increased pain 2 minute walk test: 235 feet, increased pain from 3/10 to 8/10.  GAIT: Distance walked: 235 feet Assistive device utilized: None Level of assistance: Complete Independence Comments: pt demonstrates decreased gait speed, antalgic pattern and SOB following walk test.  TREATMENT DATE:  01/07/2024  Therapeutic Exercise: -Nustep, 5 minutes, level 3 resistance, pt cued for 70 SPM -Paloff press, 2 sets of 5 reps,  pt cued for decreased rotation, GTB at chest level -Shoulder extensions, 1 sets of 10 reps, pt cued for scapular retraction, GTB at chest level -Supine bridges 1 sets of 7 reps, 3 second holds, RTB at knees, pt cued for max hip extension Neuromuscular Re-education: -Modified crunch, 1 sets of 7 reps, 3 second holds, pt cued for increased elevation of shoulders -LTR, on green exercise ball, 1 set of 10 reps bilaterally, pt cued to remain in  pain free ROM -DKTC, on green exercise ball, 1 set of 10 reps, pt cued for max pain free ROM of hips -Walking marches with tidal tank at chest, 1 lap on 10 foot line, pt cued for increased ROM of hips -Lateral stepping with tidal tank column at waist, 1 lap on 10 foot line pt cued for breathing and core control -Farmer carry with tidal tank at waist, 1 bout 30 feet   12/30/23: Nustep, 5 minutes, level 3 resistance, UE/LE seat 8 [place], average SPM 65 STS no UE 10x eccentric control Standing:  in // bars with intermittent UE assist  Toe tapping on 6in alternating with intermittent HHA  Lunges on 6in step height with 1 UE assist  Sidestep with RTB around thighs, 3RT with no HHA  Tandem stance 2x 30  SLS Lt 4, Rt 12 max of 3  Vector stance 5x5 with UE assist Seated piriformis stretch 2x 30  12/23/23 Nustep, 5 minutes, level 3 resistance, UE/LE seat 8 Standing:  in // bars with bil UE assist Vectors, 3 way hip 5X5 each  Hip abduction 10X each Hip extension 10X each Lunges 10X each onto 4 step no UE Tandem stance 30 each side intermittent HHA Sit to stand No UE's 10X     PATIENT EDUCATION:  Education details: Pt was educated on findings of PT evaluation, prognosis, frequency of therapy visits and rationale, attendance policy, and HEP if given.   Person educated: Patient Education method: Explanation Education comprehension: verbalized understanding  HOME EXERCISE PROGRAM: Access Code: [email or web address] URL: [email or web address] Date: 12/14/2023 Prepared by: Lang Ada  Exercises - Supine Bridge  - 1 x daily - 7 x weekly - 3 sets - 10 reps - 3 hold - Supine Lower Trunk Rotation  - 1 x daily - 7 x weekly - 3 sets - 10 reps - Supine Piriformis Stretch with Foot on Ground  - 1 x daily - 7 x weekly - 1 sets - 3 reps - 20 hold - Seated Piriformis Stretch  - 1 x daily - 7 x weekly - 1 sets - 3 reps - 20 hold  - Sit to Stand  - 1 x daily - 7 x weekly - 2  sets - 10 reps - Standing Hip Abduction with Counter Support  - 1 x daily - 7 x weekly - 2 sets - 10 reps - Standing March with Counter Support  - 1 x daily - 7 x weekly - 2 sets - 10 reps ASSESSMENT:  CLINICAL IMPRESSION: Patient continues to demonstrate low back pain, decreased LE/core strength, decreased gait quality and balance. Patient also demonstrates decreased endurance with aerobic based exercise during today's session with nustep. Patient able to progress dynamic balance and core activation exercises today with  good performance with verbal cueing. Patient would continue to benefit from skilled physical therapy for increased endurance with ambulation, increased core/LE strength, and improved balance for improved quality of life, improved management of low back and continued progress towards therapy goals.  Eval: Patient is a 83 y.o. female who was seen today for physical therapy evaluation and treatment for M54.50,G89.29 (ICD-10-CM) - Chronic right-sided low back pain, unspecified whether sciatica present M53.3,G89.29 (ICD-10-CM) - Chronic right SI joint pain. Patient demonstrates decreased LE/core strength, abnormal pain rating in low back, and impaired functional mobility and balance. Patient also demonstrates difficulty with ambulation during today's session with decreased stride length, velocity and SOB noted. Patient also demonstrates need for increased time for functional transfers and bed mobility due to generalized weakness. Patient requires education on PT recommendations, prognosis, realistic expectations for therapy and HEP. Patient would benefit from skilled physical therapy for decreased low back pain, increased endurance with ambulation, increased LE/core strength, and balance for improved gait quality, return to higher level of function with ADLs, and progress towards therapy goals.    OBJECTIVE IMPAIRMENTS: Abnormal gait, decreased activity tolerance, decreased balance,  decreased endurance, decreased knowledge of use of DME, decreased mobility, difficulty walking, decreased ROM, decreased strength, and pain.   ACTIVITY LIMITATIONS: carrying, lifting, bending, standing, squatting, stairs, transfers, and bed mobility  PARTICIPATION LIMITATIONS: meal prep, cleaning, laundry, driving, shopping, community activity, and yard work  PERSONAL FACTORS: Age, Fitness, Past/current experiences, and Time since onset of injury/illness/exacerbation are also affecting patient's functional outcome.   REHAB POTENTIAL: Fair chronic in nature  CLINICAL DECISION MAKING: Stable/uncomplicated  EVALUATION COMPLEXITY: Low   GOALS: Goals reviewed with patient? No  SHORT TERM GOALS: Target date: 01/04/24  Pt will be independent with HEP in order to demonstrate participation in Physical Therapy POC.  Baseline: Goal status: INITIAL  2.  Pt will report 6/10 pain with mobility in order to demonstrate improved pain with ADLs.  Baseline: 8/10 Goal status: INITIAL  LONG TERM GOALS: Target date: 01/25/24  Pt will improve 5TSTS by at least 2.3 seconds in order to demonstrate improved functional strength to return to desired activities.  Baseline: see objective.  Goal status: INITIAL  2.  Pt will improve 2 MWT by 50 feet in order to demonstrate improved functional ambulatory capacity in community setting.  Baseline: see objective.  Goal status: INITIAL  3.  Pt will improve Modified Oswestry score by at least 6 points in order to demonstrate improved pain with functional goals and outcomes. Baseline: see objective.  Goal status: INITIAL  4.  Pt will report 4/10 pain with mobility in order to demonstrate reduced pain with ADLs lasting greater than 30 minutes.  Baseline: see objective.  Goal status: INITIAL   PLAN:  PT FREQUENCY: 1-2x/week  PT DURATION: 6 weeks  PLANNED INTERVENTIONS: [id number]-Therapeutic exercises, 97530- Therapeutic activity, [id number]- Neuromuscular  re-education, 97535- Self Care, [id number]- Manual therapy, [phone]- Gait training, Patient/Family education, Balance training, Stair training, Spinal mobilization, DME instructions, Cryotherapy, and Moist heat.  PLAN FOR NEXT SESSION: progress endurance and strength training for core and LEs, decrease low back pain with functional mobility.   Lang Ada, PT, DPT [facility] Office: [phone] 4:29 PM, 01/07/24

## 2024-01-11 ENCOUNTER — Ambulatory Visit (HOSPITAL_COMMUNITY): Admitting: Physical Therapy

## 2024-01-11 DIAGNOSIS — M5386 Other specified dorsopathies, lumbar region: Secondary | ICD-10-CM

## 2024-01-11 DIAGNOSIS — Z7409 Other reduced mobility: Secondary | ICD-10-CM | POA: Diagnosis not present

## 2024-01-11 DIAGNOSIS — M5459 Other low back pain: Secondary | ICD-10-CM

## 2024-01-11 NOTE — Therapy (Signed)
 OUTPATIENT PHYSICAL THERAPY THORACOLUMBAR TREATMENT   Patient Name: Kerry Richardson MRN: [id number] DOB:[birth year], 83 y.o., female Today's Date: 01/11/2024  END OF SESSION:  PT End of Session - 01/11/24 1130     Visit Number 6    Number of Visits 12    Date for Recertification  01/25/24    Authorization Type [employer] MEDICARE    Authorization Time Period seeking auth    Progress Note Due on Visit 10    PT Start Time 1122    PT Stop Time 1200    PT Time Calculation (min) 38 min    Activity Tolerance Patient tolerated treatment well;Patient limited by pain;No increased pain;Patient limited by fatigue    Behavior During Therapy [facility] for tasks assessed/performed              Past Medical History:  Diagnosis Date   Allergy    chronic   Alpha galactosidase deficiency    Arthritis    Back pain    Bulging discs    Bursitis    Cataract Several years ago   Removed   DDD (degenerative disc disease)    Dyspnea    with exertion   GERD (gastroesophageal reflux disease)    mild   Glaucoma    H/O removal of cyst    finger (uncertain of which finger)   Hypertension 2021   Migraine headache    Neck pain    PONV (postoperative nausea and vomiting)    Reflux    Seasonal allergies    Stroke (HCC) 10/22/2019   Past Surgical History:  Procedure Laterality Date   ABDOMINAL HYSTERECTOMY     CARPAL TUNNEL RELEASE     bilateral   CATARACT EXTRACTION W/PHACO Left 10/01/2014   Procedure: CATARACT EXTRACTION PHACO AND INTRAOCULAR LENS PLACEMENT LEFT EYE;  Surgeon: Cherene Mania, MD;  Location: [facility];  Service: Ophthalmology;  Laterality: Left;  CDE:8.15   CATARACT EXTRACTION W/PHACO Right 08/02/2017   Procedure: CATARACT EXTRACTION WITH PHACOEMULSIFICATION  AND INTRAOCULAR LENS PLACEMENT AND PLACEMENT OF iSTENT GLAUCOMA DEVICE RIGHT EYE;  Surgeon: Mania Cherene, MD;  Location: [facility];  Service: Ophthalmology;  Laterality: Right;  CDE: 7.77   CERVICAL FUSION     CHOLECYSTECTOMY N/A  05/15/2013   Procedure: LAPAROSCOPIC CHOLECYSTECTOMY WITH INTRAOPERATIVE CHOLANGIOGRAM;  Surgeon: Oneil DELENA Budge, MD;  Location: [facility];  Service: General;  Laterality: N/A;   CHONDROPLASTY  03/14/2012   Procedure: CHONDROPLASTY;  Surgeon: Taft FORBES Minerva, MD;  Location: [facility];  Service: Orthopedics;  Laterality: Left;  medial condyle   CHONDROPLASTY Right 12/23/2012   Procedure: KNEE ARTHROSCOPY WITH CHONDROPLASTY WITH LIMITED DEBRIDMENT;  Surgeon: Taft FORBES Minerva, MD;  Location: [facility];  Service: Orthopedics;  Laterality: Right;   EYE SURGERY  Cataract both 4 yrs (?)   FOOT SURGERY Bilateral    morton's neuroma   implantable loop recorder placement  10/31/2019    Medtronic Reveal Linq model LNQ 11 (SN [id number]) implantable loop recorder implanted by Dr Kelsie for cryptogenic stroke   KNEE ARTHROSCOPY WITH MEDIAL MENISECTOMY  03/14/2012   Procedure: KNEE ARTHROSCOPY WITH MEDIAL MENISECTOMY;  Surgeon: Taft FORBES Minerva, MD;  Location: [facility];  Service: Orthopedics;  Laterality: Left;   SPINE SURGERY  Neck/plate & screws   TEAR DUCT PROBING Right 09/2020   Tear duct bypass surgery per pt.   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/05/2023  Statin declined 01/27/2022   Prediabetes 03/10/2021   Pure hypercholesterolemia    Cryptogenic stroke (HCC) 10/27/2019   Seasonal allergic rhinitis 02/22/2017   Essential hypertension 07/12/2013   Osteoarthritis, knee 12/03/2011    PCP: Cook, Jayce G, DO  REFERRING PROVIDER: Bluford Jacqulyn MATSU, DO  REFERRING DIAG: [phone] (ICD-10-CM) - Chronic right-sided low back pain, unspecified whether sciatica present M53.3,G89.29 (ICD-10-CM) - Chronic right SI joint pain  Rationale for Evaluation and Treatment: Rehabilitation  THERAPY DIAG:  No diagnosis found.  ONSET DATE: several months  SUBJECTIVE:                                                                                                                                                                                            SUBJECTIVE STATEMENT: Pt reports her pain is constant with movement 5-8/10  but none at rest.  Reports still working on her house and will be glad when it is done.    Eval: Pt states back pain has been getting worse for several months. Pt states sciatic problems do occur on occasion, today was the first time in months. Pt states the low back pain keeps her from going to the grand kids ball games, working out in the yard, walk very far, or standing for a long time. Pt states she has been using the cane for long distances for about year.   PERTINENT HISTORY:  Hx of stroke, 2020 no residual effects (perhaps memory issues)  PAIN:  Are you having pain? Yes: NPRS scale: 8/10, probably walking, 0/10 when she is sitting most of the time Pain location: right low back Pain description: sometimes it is a sharp pain, constant ache Aggravating factors: standing, walking, cooking complete meal, chores Relieving factors: sitting down, getting off her feet  PRECAUTIONS: None  RED FLAGS: None   WEIGHT BEARING RESTRICTIONS: No  FALLS:  Has patient fallen in last 6 months? Yes. Number of falls 1, out of rolling chair  LIVING ENVIRONMENT: Lives with: lives alone Lives in: House/apartment Stairs: Yes: Internal: 15 steps; on right going up and External: 4-5 steps; on right going up Has following equipment at home: Single point cane  OCCUPATION: retired, Customer service manager  PLOF: Independent and Independent with basic ADLs  PATIENT GOALS: decrease the back pain, increased functional mobility, and activity tolerance/endurance, improve balance  NEXT MD VISIT: 02/07/24  OBJECTIVE:  Note: Objective measures were completed at Evaluation unless otherwise noted.  DIAGNOSTIC FINDINGS:  CLINICAL DATA:  SI joint pain.   EXAM: BILATERAL SACROILIAC JOINTS - 3+ VIEW    COMPARISON:  None Available.   FINDINGS: Normal and symmetric sacroiliac  joints.   No acute fracture or dislocation.   No aggressive osseous lesion.   Visualized sacral arcuate lines are unremarkable.   Unremarkable symphysis pubis.   There are mild degenerative changes of bilateral hip joints without significant joint space narrowing. Osteophytosis of the superior acetabulum.   No radiopaque foreign bodies.   IMPRESSION: *No acute osseous abnormality of the sacroiliac joints.  CLINICAL DATA:  Low back pain.   EXAM: LUMBAR SPINE - COMPLETE 4+ VIEW   COMPARISON:  07/28/2022.   FINDINGS: There are 5 nonrib-bearing lumbar vertebrae.   Anatomic lumbar curvature.   No spondylolysis.   There is grade 1 anterolisthesis of L5 over S1.   Vertebral body heights are maintained.   No aggressive osseous lesion.   Moderate multilevel degenerative changes in the form of reduced intervertebral disc height, endplate sclerosis/irregularity, facet arthropathy and marginal osteophyte formation.   Sacroiliac joints are symmetric.   Visualized soft tissues are within normal limits.   There are surgical clips in the right upper quadrant, typical of a previous cholecystectomy.   IMPRESSION: No acute osseous abnormality of the lumbar spine. Moderate multilevel degenerative changes.  PATIENT SURVEYS:  Modified Oswestry:   Modified Oswestry 16 / 50 = 32.0 %  COGNITION: Overall cognitive status: pt reports she feels she has to think longer about things since the stroke in 2020.     SENSATION: Not tested  POSTURE: rounded shoulders, forward head, posterior pelvic tilt, and flexed trunk   PALPATION: Pt demonstrates increased tenderness to palpation of lumbar spine and lumbar paraspinals. (L3-L5, most problematic)  LUMBAR ROM:   AROM eval  Flexion 65  Extension 5  Right lateral flexion 15, discomfort  Left lateral flexion 20  Right rotation 50% available, pain  Left  rotation 50% available   (Blank rows = not tested)  LOWER EXTREMITY MMT:    MMT Right eval Left eval  Hip flexion 3+ 4-  Hip extension 3 3  Hip abduction 3 3+  Hip adduction    Hip internal rotation    Hip external rotation    Knee flexion    Knee extension    Ankle dorsiflexion    Ankle plantarflexion    Ankle inversion    Ankle eversion     (Blank rows = not tested)  LUMBAR SPECIAL TESTS:  Straight leg raise test: Negative  FUNCTIONAL TESTS:  5 times sit to stand: 15.84 seconds no increased pain 2 minute walk test: 235 feet, increased pain from 3/10 to 8/10.  GAIT: Distance walked: 235 feet Assistive device utilized: None Level of assistance: Complete Independence Comments: pt demonstrates decreased gait speed, antalgic pattern and SOB following walk test.  TREATMENT DATE:  12/30/23: Nustep, 5 minutes, level 4 resistance, UE/LE seat 8 [place], average SPM 65, UE/LE Standing:  Pallof press 2X10 GTB at chest level   Shoulder extensions GTB 2X10  Scap retraction/rows GTB 2X10 in // bars with intermittent UE assist  Toe tapping on 6in alternating without UE assist 2X10  Lunges on 6in step height without UE assist X10 each  Tandem stance 2x 30  SLS Lt 5, Rt 16 max of 3   01/07/2024  Therapeutic Exercise: -Nustep, 5 minutes, level 3 resistance, pt cued for 70 SPM -Paloff press, 2 sets of 5 reps, pt cued for decreased rotation, GTB at chest level -Shoulder extensions, 1 sets of 10 reps, pt cued for scapular retraction, GTB at chest level -Supine bridges 1 sets of 7 reps, 3 second holds, RTB at  knees, pt cued for max hip extension Neuromuscular Re-education: -Modified crunch, 1 sets of 7 reps, 3 second holds, pt cued for increased elevation of shoulders -LTR, on green exercise ball, 1 set of 10 reps bilaterally, pt cued to remain in pain free ROM -DKTC, on green exercise ball, 1 set of 10 reps, pt cued for max pain free ROM of hips -Walking marches with tidal  tank at chest, 1 lap on 10 foot line, pt cued for increased ROM of hips -Lateral stepping with tidal tank column at waist, 1 lap on 10 foot line pt cued for breathing and core control -Farmer carry with tidal tank at waist, 1 bout 30 feet   12/30/23: Nustep, 5 minutes, level 3 resistance, UE/LE seat 8 [place], average SPM 65 STS no UE 10x eccentric control Standing:  in // bars with intermittent UE assist  Toe tapping on 6in alternating with intermittent HHA  Lunges on 6in step height with 1 UE assist  Sidestep with RTB around thighs, 3RT with no HHA  Tandem stance 2x 30  SLS Lt 4, Rt 12 max of 3  Vector stance 5x5 with UE assist Seated piriformis stretch 2x 30  12/23/23 Nustep, 5 minutes, level 3 resistance, UE/LE seat 8 Standing:  in // bars with bil UE assist Vectors, 3 way hip 5X5 each  Hip abduction 10X each Hip extension 10X each Lunges 10X each onto 4 step no UE Tandem stance 30 each side intermittent HHA Sit to stand No UE's 10X     PATIENT EDUCATION:  Education details: Pt was educated on findings of PT evaluation, prognosis, frequency of therapy visits and rationale, attendance policy, and HEP if given.   Person educated: Patient Education method: Explanation Education comprehension: verbalized understanding  HOME EXERCISE PROGRAM: Access Code: [email or web address] URL: [email or web address] Date: 12/14/2023 Prepared by: Lang Ada  Exercises - Supine Bridge  - 1 x daily - 7 x weekly - 3 sets - 10 reps - 3 hold - Supine Lower Trunk Rotation  - 1 x daily - 7 x weekly - 3 sets - 10 reps - Supine Piriformis Stretch with Foot on Ground  - 1 x daily - 7 x weekly - 1 sets - 3 reps - 20 hold - Seated Piriformis Stretch  - 1 x daily - 7 x weekly - 1 sets - 3 reps - 20 hold  - Sit to Stand  - 1 x daily - 7 x weekly - 2 sets - 10 reps - Standing Hip Abduction with Counter Support  - 1 x daily - 7 x weekly - 2 sets - 10 reps - Standing March with  Counter Support  - 1 x daily - 7 x weekly - 2 sets - 10 reps ASSESSMENT:  CLINICAL IMPRESSION: Began session with warm up on nustep.  Increased sets of theraband exercises and other sets/reps where able.  Able to complete alternating toe taps without UE assist today as well as lunges with good control.  Pt reported unable to complete second set of lunges due to fatigue.  Cues needed for overall form/posturing.  Pt able to increase single leg stance time today bilaterally, Rt longer than lt.  Pt required intermittent rest breaks due to fatigue.   Patient will continue to benefit from skilled physical therapy for increased endurance with ambulation, increased core/LE strength, and improved balance for improved quality of life, improved management of low back and continued progress towards therapy goals.    Eval: Patient is a  83 y.o. female who was seen today for physical therapy evaluation and treatment for M54.50,G89.29 (ICD-10-CM) - Chronic right-sided low back pain, unspecified whether sciatica present M53.3,G89.29 (ICD-10-CM) - Chronic right SI joint pain. Patient demonstrates decreased LE/core strength, abnormal pain rating in low back, and impaired functional mobility and balance. Patient also demonstrates difficulty with ambulation during today's session with decreased stride length, velocity and SOB noted. Patient also demonstrates need for increased time for functional transfers and bed mobility due to generalized weakness. Patient requires education on PT recommendations, prognosis, realistic expectations for therapy and HEP. Patient would benefit from skilled physical therapy for decreased low back pain, increased endurance with ambulation, increased LE/core strength, and balance for improved gait quality, return to higher level of function with ADLs, and progress towards therapy goals.    OBJECTIVE IMPAIRMENTS: Abnormal gait, decreased activity tolerance, decreased balance, decreased endurance,  decreased knowledge of use of DME, decreased mobility, difficulty walking, decreased ROM, decreased strength, and pain.   ACTIVITY LIMITATIONS: carrying, lifting, bending, standing, squatting, stairs, transfers, and bed mobility  PARTICIPATION LIMITATIONS: meal prep, cleaning, laundry, driving, shopping, community activity, and yard work  PERSONAL FACTORS: Age, Fitness, Past/current experiences, and Time since onset of injury/illness/exacerbation are also affecting patient's functional outcome.   REHAB POTENTIAL: Fair chronic in nature  CLINICAL DECISION MAKING: Stable/uncomplicated  EVALUATION COMPLEXITY: Low   GOALS: Goals reviewed with patient? No  SHORT TERM GOALS: Target date: 01/04/24  Pt will be independent with HEP in order to demonstrate participation in Physical Therapy POC.  Baseline: Goal status: INITIAL  2.  Pt will report 6/10 pain with mobility in order to demonstrate improved pain with ADLs.  Baseline: 8/10 Goal status: INITIAL  LONG TERM GOALS: Target date: 01/25/24  Pt will improve 5TSTS by at least 2.3 seconds in order to demonstrate improved functional strength to return to desired activities.  Baseline: see objective.  Goal status: INITIAL  2.  Pt will improve 2 MWT by 50 feet in order to demonstrate improved functional ambulatory capacity in community setting.  Baseline: see objective.  Goal status: INITIAL  3.  Pt will improve Modified Oswestry score by at least 6 points in order to demonstrate improved pain with functional goals and outcomes. Baseline: see objective.  Goal status: INITIAL  4.  Pt will report 4/10 pain with mobility in order to demonstrate reduced pain with ADLs lasting greater than 30 minutes.  Baseline: see objective.  Goal status: INITIAL   PLAN:  PT FREQUENCY: 1-2x/week  PT DURATION: 6 weeks  PLANNED INTERVENTIONS: 97110-Therapeutic exercises, 97530- Therapeutic activity, [id number]- Neuromuscular re-education, 97535- Self  Care, [id number]- Manual therapy, [phone]- Gait training, Patient/Family education, Balance training, Stair training, Spinal mobilization, DME instructions, Cryotherapy, and Moist heat.  PLAN FOR NEXT SESSION: progress endurance and strength training for core and LEs, decrease low back pain with functional mobility.   Greig KATHEE Fuse, PTA/CLT [facility] Ph: [phone] 11:30 AM, 01/11/24

## 2024-01-21 ENCOUNTER — Encounter (HOSPITAL_COMMUNITY)

## 2024-01-21 ENCOUNTER — Ambulatory Visit (HOSPITAL_COMMUNITY)

## 2024-01-21 ENCOUNTER — Encounter (HOSPITAL_COMMUNITY): Payer: Self-pay

## 2024-01-21 DIAGNOSIS — K080 Exfoliation of teeth due to systemic causes: Secondary | ICD-10-CM | POA: Diagnosis not present

## 2024-01-21 DIAGNOSIS — M5386 Other specified dorsopathies, lumbar region: Secondary | ICD-10-CM

## 2024-01-21 DIAGNOSIS — M5459 Other low back pain: Secondary | ICD-10-CM | POA: Diagnosis not present

## 2024-01-21 DIAGNOSIS — Z7409 Other reduced mobility: Secondary | ICD-10-CM

## 2024-01-21 NOTE — Therapy (Signed)
 OUTPATIENT PHYSICAL THERAPY THORACOLUMBAR TREATMENT  PHYSICAL THERAPY DISCHARGE SUMMARY  Visits from Start of Care: 6  Current functional level related to goals / functional outcomes: lacking   Remaining deficits: Back pain   Education / Equipment: HEP continuance and functional mobility   Patient agrees to discharge. Patient goals were not met. Patient is being discharged due to did not respond to therapy.   Patient Name: Kerry Richardson MRN: [id number] DOB:1940/04/07, 83 y.o., female Today's Date: 01/21/2024  END OF SESSION:  PT End of Session - 01/21/24 1546     Visit Number 7    Number of Visits 12    Date for Recertification  01/25/24    Authorization Type [employer] MEDICARE    Authorization Time Period seeking auth    Authorization - Visit Number 5    Progress Note Due on Visit 10    PT Start Time 1546    PT Stop Time 1625    PT Time Calculation (min) 39 min    Activity Tolerance Patient tolerated treatment well;Patient limited by pain;Patient limited by fatigue    Behavior During Therapy [facility] for tasks assessed/performed               Past Medical History:  Diagnosis Date   Allergy    chronic   Alpha galactosidase deficiency    Arthritis    Back pain    Bulging discs    Bursitis    Cataract Several years ago   Removed   DDD (degenerative disc disease)    Dyspnea    with exertion   GERD (gastroesophageal reflux disease)    mild   Glaucoma    H/O removal of cyst    finger (uncertain of which finger)   Hypertension 2021   Migraine headache    Neck pain    PONV (postoperative nausea and vomiting)    Reflux    Seasonal allergies    Stroke (HCC) 10/22/2019   Past Surgical History:  Procedure Laterality Date   ABDOMINAL HYSTERECTOMY     CARPAL TUNNEL RELEASE     bilateral   CATARACT EXTRACTION W/PHACO Left 10/01/2014   Procedure: CATARACT EXTRACTION PHACO AND INTRAOCULAR LENS PLACEMENT LEFT EYE;  Surgeon: Cherene Mania, MD;  Location: [facility];   Service: Ophthalmology;  Laterality: Left;  CDE:8.15   CATARACT EXTRACTION W/PHACO Right 08/02/2017   Procedure: CATARACT EXTRACTION WITH PHACOEMULSIFICATION  AND INTRAOCULAR LENS PLACEMENT AND PLACEMENT OF iSTENT GLAUCOMA DEVICE RIGHT EYE;  Surgeon: Mania Cherene, MD;  Location: [facility];  Service: Ophthalmology;  Laterality: Right;  CDE: 7.77   CERVICAL FUSION     CHOLECYSTECTOMY N/A 05/15/2013   Procedure: LAPAROSCOPIC CHOLECYSTECTOMY WITH INTRAOPERATIVE CHOLANGIOGRAM;  Surgeon: Oneil DELENA Budge, MD;  Location: [facility];  Service: General;  Laterality: N/A;   CHONDROPLASTY  03/14/2012   Procedure: CHONDROPLASTY;  Surgeon: Taft FORBES Minerva, MD;  Location: [facility];  Service: Orthopedics;  Laterality: Left;  medial condyle   CHONDROPLASTY Right 12/23/2012   Procedure: KNEE ARTHROSCOPY WITH CHONDROPLASTY WITH LIMITED DEBRIDMENT;  Surgeon: Taft FORBES Minerva, MD;  Location: [facility];  Service: Orthopedics;  Laterality: Right;   EYE SURGERY  Cataract both 4 yrs (?)   FOOT SURGERY Bilateral    morton's neuroma   implantable loop recorder placement  10/31/2019    Medtronic Reveal Linq model LNQ 11 (SN [id number]) implantable loop recorder implanted by Dr Kelsie for cryptogenic stroke   KNEE ARTHROSCOPY WITH MEDIAL MENISECTOMY  03/14/2012   Procedure: KNEE ARTHROSCOPY WITH  MEDIAL MENISECTOMY;  Surgeon: Taft FORBES Minerva, MD;  Location: [facility];  Service: Orthopedics;  Laterality: Left;   SPINE SURGERY  Neck/plate & screws   TEAR DUCT PROBING Right 09/2020   Tear duct bypass surgery per pt.   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/05/2023   Statin declined 01/27/2022   Prediabetes 03/10/2021   Pure hypercholesterolemia    Cryptogenic stroke (HCC) 10/27/2019   Seasonal allergic rhinitis 02/22/2017   Essential hypertension 07/12/2013   Osteoarthritis, knee 12/03/2011    PCP: Cook, Jayce G, DO  REFERRING  PROVIDER: Bluford Jacqulyn MATSU, DO  REFERRING DIAG: [id number] (ICD-10-CM) - Chronic right-sided low back pain, unspecified whether sciatica present M53.3,G89.29 (ICD-10-CM) - Chronic right SI joint pain  Rationale for Evaluation and Treatment: Rehabilitation  THERAPY DIAG:  Other low back pain  Impaired functional mobility, balance, gait, and endurance  Decreased ROM of lumbar spine  ONSET DATE: several months  SUBJECTIVE:                                                                                                                                                                                           SUBJECTIVE STATEMENT: Pt reports her pain has continued with walking 7/10 this morning but has not been as bad this afternoon. Pt states she is tired after a busy day today.    Eval: Pt states back pain has been getting worse for several months. Pt states sciatic problems do occur on occasion, today was the first time in months. Pt states the low back pain keeps her from going to the grand kids ball games, working out in the yard, walk very far, or standing for a long time. Pt states she has been using the cane for long distances for about year.   PERTINENT HISTORY:  Hx of stroke, 2020 no residual effects (perhaps memory issues)  PAIN:  Are you having pain? Yes: NPRS scale: 8/10, probably walking, 0/10 when she is sitting most of the time Pain location: right low back Pain description: sometimes it is a sharp pain, constant ache Aggravating factors: standing, walking, cooking complete meal, chores Relieving factors: sitting down, getting off her feet  PRECAUTIONS: None  RED FLAGS: None   WEIGHT BEARING RESTRICTIONS: No  FALLS:  Has patient fallen in last 6 months? Yes. Number of falls 1, out of rolling chair  LIVING ENVIRONMENT: Lives with: lives alone Lives in: House/apartment Stairs: Yes: Internal: 15 steps; on right going up and External: 4-5 steps; on right going  up Has following  equipment at home: Single point cane  OCCUPATION: retired, customer service manager  PLOF: Independent and Independent with basic ADLs  PATIENT GOALS: decrease the back pain, increased functional mobility, and activity tolerance/endurance, improve balance  NEXT MD VISIT: 02/07/24  OBJECTIVE:  Note: Objective measures were completed at Evaluation unless otherwise noted.  DIAGNOSTIC FINDINGS:  CLINICAL DATA:  SI joint pain.   EXAM: BILATERAL SACROILIAC JOINTS - 3+ VIEW   COMPARISON:  None Available.   FINDINGS: Normal and symmetric sacroiliac joints.   No acute fracture or dislocation.   No aggressive osseous lesion.   Visualized sacral arcuate lines are unremarkable.   Unremarkable symphysis pubis.   There are mild degenerative changes of bilateral hip joints without significant joint space narrowing. Osteophytosis of the superior acetabulum.   No radiopaque foreign bodies.   IMPRESSION: *No acute osseous abnormality of the sacroiliac joints.  CLINICAL DATA:  Low back pain.   EXAM: LUMBAR SPINE - COMPLETE 4+ VIEW   COMPARISON:  07/28/2022.   FINDINGS: There are 5 nonrib-bearing lumbar vertebrae.   Anatomic lumbar curvature.   No spondylolysis.   There is grade 1 anterolisthesis of L5 over S1.   Vertebral body heights are maintained.   No aggressive osseous lesion.   Moderate multilevel degenerative changes in the form of reduced intervertebral disc height, endplate sclerosis/irregularity, facet arthropathy and marginal osteophyte formation.   Sacroiliac joints are symmetric.   Visualized soft tissues are within normal limits.   There are surgical clips in the right upper quadrant, typical of a previous cholecystectomy.   IMPRESSION: No acute osseous abnormality of the lumbar spine. Moderate multilevel degenerative changes.  PATIENT SURVEYS:  Modified Oswestry:   Modified Oswestry 16 / 50 = 32.0 % 01/21/24:  Modified Oswestry 17 /  50 = 34.0 % COGNITION: Overall cognitive status: pt reports she feels she has to think longer about things since the stroke in 2020.     SENSATION: Not tested  POSTURE: rounded shoulders, forward head, posterior pelvic tilt, and flexed trunk   PALPATION: Pt demonstrates increased tenderness to palpation of lumbar spine and lumbar paraspinals. (L3-L5, most problematic)  LUMBAR ROM:   AROM eval  Flexion 65  Extension 5  Right lateral flexion 15, discomfort  Left lateral flexion 20  Right rotation 50% available, pain  Left rotation 50% available   (Blank rows = not tested)  LOWER EXTREMITY MMT:    MMT Right eval Left eval  Hip flexion 3+ 4-  Hip extension 3 3  Hip abduction 3 3+  Hip adduction    Hip internal rotation    Hip external rotation    Knee flexion    Knee extension    Ankle dorsiflexion    Ankle plantarflexion    Ankle inversion    Ankle eversion     (Blank rows = not tested)  LUMBAR SPECIAL TESTS:  Straight leg raise test: Negative  FUNCTIONAL TESTS:  5 times sit to stand: 15.84 seconds no increased pain 2 minute walk test: 235 feet, increased pain from 3/10 to 8/10.  GAIT: Distance walked: 235 feet Assistive device utilized: None Level of assistance: Complete Independence Comments: pt demonstrates decreased gait speed, antalgic pattern and SOB following walk test.  TREATMENT DATE:  01/21/2024  Therapeutic Exercise: -Nustep, 5 minutes, level 4 resistance, pt cued for 70 SPM -Supine bridges on green exercise ball 2 sets of 10 reps, 3 second holds, pt cued for max hip extension Neuromuscular Re-education: -Bird dogs, 3 reps bilateral, ceased due  to increased left knee pain, pt cued for  -LTR, on green exercise ball, 3 set of 10 reps bilaterally, pt cued to remain in pain free ROM Therapeutic Activity: -Sled pushes, 30lbs, 2 laps, pt cued for constant motion of sled, 40 foot laps -Lateral step up and overs, 4 inch step>blue foam>6inch step, 3  laps, carrying 5lb kettle bell, pt cued for core control and level shoulders and hips -Kettle bell lifts, 5 lb, 2 sets of 10 reps, upright row added last set, pt cued for tight core and upright spine   PATIENT EDUCATION:  Education details: Pt was educated on findings of PT evaluation, prognosis, frequency of therapy visits and rationale, attendance policy, and HEP if given.   Person educated: Patient Education method: Explanation Education comprehension: verbalized understanding  HOME EXERCISE PROGRAM: Access Code: [email or web address] URL: [email or web address] Date: 01/21/2024 Prepared by: Lang Ada  Exercises - Supine Bridge  - 1 x daily - 7 x weekly - 3 sets - 10 reps - 3 hold - Supine Lower Trunk Rotation  - 1 x daily - 7 x weekly - 3 sets - 10 reps - Supine Piriformis Stretch with Foot on Ground  - 1 x daily - 7 x weekly - 1 sets - 3 reps - 20 hold - Seated Piriformis Stretch  - 1 x daily - 7 x weekly - 1 sets - 3 reps - 20 hold - Sit to Stand  - 1 x daily - 7 x weekly - 2 sets - 10 reps - Standing Hip Abduction with Counter Support  - 1 x daily - 7 x weekly - 2 sets - 10 reps - Standing March with Counter Support  - 1 x daily - 7 x weekly - 2 sets - 10 reps - Neutral Curl Up with Straight Leg  - 1 x daily - 7 x weekly - 2 sets - 10 reps - 3 hold  ASSESSMENT:  CLINICAL IMPRESSION: Patient continues to demonstrate unchanged low back pain with walking and prolonged standing. Pt demonstrates improved LE strength, consistent gait quality and balance. Patient also demonstrates improved endurance with aerobic based exercise during today's session on nustep. Patient able to continue dynamic balance and core activation exercises today with step up variations and kettle bell lifts, good performance with verbal cueing. Patient to be discharged from therapy at this time due to no response to therapy, pt educated on importance of functional mobility and continuing of HEP for independent  management of low back. Pt has appointment with referring in a couple of weeks for follow up.       OBJECTIVE IMPAIRMENTS: Abnormal gait, decreased activity tolerance, decreased balance, decreased endurance, decreased knowledge of use of DME, decreased mobility, difficulty walking, decreased ROM, decreased strength, and pain.   ACTIVITY LIMITATIONS: carrying, lifting, bending, standing, squatting, stairs, transfers, and bed mobility  PARTICIPATION LIMITATIONS: meal prep, cleaning, laundry, driving, shopping, community activity, and yard work  PERSONAL FACTORS: Age, Fitness, Past/current experiences, and Time since onset of injury/illness/exacerbation are also affecting patient's functional outcome.   REHAB POTENTIAL: Fair chronic in nature  CLINICAL DECISION MAKING: Stable/uncomplicated  EVALUATION COMPLEXITY: Low   GOALS: Goals reviewed with patient? No  SHORT TERM GOALS: Target date: 01/04/24  Pt will be independent with HEP in order to demonstrate participation in Physical Therapy POC.  Baseline: Goal status: NOT MET  2.  Pt will report 6/10 pain with mobility in order to demonstrate improved pain with ADLs.  Baseline: 8/10 Goal status: NOT MET  LONG TERM GOALS: Target date: 01/25/24  Pt will improve 5TSTS by at least 2.3 seconds in order to demonstrate improved functional strength to return to desired activities.  Baseline: see objective.  Goal status: IN PROGRESS  2.  Pt will improve 2 MWT by 50 feet in order to demonstrate improved functional ambulatory capacity in community setting.  Baseline: see objective.  Goal status: IN PROGRESS  3.  Pt will improve Modified Oswestry score by at least 6 points in order to demonstrate improved pain with functional goals and outcomes. Baseline: see objective.  Goal status: NOT MET  4.  Pt will report 4/10 pain with mobility in order to demonstrate reduced pain with ADLs lasting greater than 30 minutes.  Baseline: see  objective.  Goal status: NOT MET   PLAN:  PT FREQUENCY: 1-2x/week  PT DURATION: 6 weeks  PLANNED INTERVENTIONS: 97110-Therapeutic exercises, 97530- Therapeutic activity, 97112- Neuromuscular re-education, 97535- Self Care, 02859- Manual therapy, [phone]- Gait training, Patient/Family education, Balance training, Stair training, Spinal mobilization, DME instructions, Cryotherapy, and Moist heat.  PLAN FOR NEXT SESSION: discharged  Lang Ada, PT, DPT [facility] Office: [phone] 4:49 PM, 01/21/24

## 2024-02-03 DIAGNOSIS — K080 Exfoliation of teeth due to systemic causes: Secondary | ICD-10-CM | POA: Diagnosis not present

## 2024-02-07 ENCOUNTER — Ambulatory Visit (INDEPENDENT_AMBULATORY_CARE_PROVIDER_SITE_OTHER): Admitting: Family Medicine

## 2024-02-07 ENCOUNTER — Encounter: Payer: Self-pay | Admitting: Family Medicine

## 2024-02-07 VITALS — BP 164/66 | HR 88 | Temp 97.9°F | Ht 60.0 in | Wt 209.0 lb

## 2024-02-07 DIAGNOSIS — M545 Low back pain, unspecified: Secondary | ICD-10-CM

## 2024-02-07 DIAGNOSIS — L989 Disorder of the skin and subcutaneous tissue, unspecified: Secondary | ICD-10-CM | POA: Diagnosis not present

## 2024-02-07 DIAGNOSIS — I10 Essential (primary) hypertension: Secondary | ICD-10-CM | POA: Diagnosis not present

## 2024-02-07 DIAGNOSIS — G8929 Other chronic pain: Secondary | ICD-10-CM

## 2024-02-07 NOTE — Patient Instructions (Signed)
 Call dermatology for an appt.  Referral placed.  Follow up in 6 months.  Take care  Dr. Bluford

## 2024-02-08 NOTE — Progress Notes (Signed)
 Subjective:  Patient ID: Kerry Richardson, female    DOB: 1940-11-23  Age: 83 y.o. MRN: [id number]  CC:   Chief Complaint  Patient presents with   Follow-up    Low back pain comp PT several weeks S!   skin concerns    Pt states has a place on her L foot that will not heal completely Possible CA on L arm, lipoma on wrist    HPI:  83 year old female presents for follow-up.  Patient's blood pressure elevated here today.  Patient states that she has several concerns today.  She has an area of concern to the dorsum of her left foot.  She states that the area has not resolved/healed.  It has been 6 months.  It is raised, red, and bleeds often.  Patient also has an area of concern to the left forearm.  She believes that it may be a skin cancer.  She would like me to examine this today.  Patient also has a firm area to the right wrist.  Lastly, patient reports continued ongoing low back pain.   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/05/2023   Skin lesion 03/24/2022   Statin declined 01/27/2022   Prediabetes 03/10/2021   Pure hypercholesterolemia    Cryptogenic stroke (HCC) 10/27/2019   Seasonal allergic rhinitis 02/22/2017   Essential hypertension 07/12/2013   Osteoarthritis, knee 12/03/2011    Social Hx   Social History   Socioeconomic History   Marital status: Widowed    Spouse name: Not on file   Number of children: 2   Years of education: 12+   Highest education level: Associate degree: academic program  Occupational History   Occupation: veterinary surgeon  Tobacco Use   Smoking status: Never   Smokeless tobacco: Never  Vaping Use   Vaping status: Never Used  Substance and Sexual Activity   Alcohol use: Never   Drug use: No   Sexual activity: Not Currently    Birth control/protection: Abstinence  Other Topics Concern   Not on file  Social History Narrative   Husband passed in  2020 from Alzheimer's    2 sons-sees often    Several grandchildren.   Social Drivers of Corporate Investment Banker Strain: Low Risk  (02/03/2024)   Overall Financial Resource Strain (CARDIA)    Difficulty of Paying Living Expenses: Not hard at all  Food Insecurity: No Food Insecurity (02/03/2024)   Hunger Vital Sign    Worried About Running Out of Food in the Last Year: Never true    Ran Out of Food in the Last Year: Never true  Transportation Needs: No Transportation Needs (02/03/2024)   PRAPARE - Administrator, Civil Service (Medical): No    Lack of Transportation (Non-Medical): No  Physical Activity: Inactive (02/03/2024)   Exercise Vital Sign    Days of Exercise per Week: 0 days    Minutes of Exercise per Session: Not on file  Stress: No Stress Concern Present (02/03/2024)   [employer] of Occupational Health - Occupational Stress Questionnaire    Feeling of Stress: Not at all  Social Connections: Moderately Integrated (02/03/2024)   Social Connection and Isolation Panel    Frequency of Communication with Friends and Family: More than three times a week    Frequency of Social Gatherings with Friends and Family: More than three times a week  Attends Religious Services: More than 4 times per year    Active Member of Clubs or Organizations: Yes    Attends Banker Meetings: More than 4 times per year    Marital Status: Widowed    Review of Systems Per HPI  Objective:  BP (!) 164/66   Pulse 88   Temp 97.9 F (36.6 C)   Ht 5' (1.524 m)   Wt 209 lb (94.8 kg)   SpO2 95%   BMI 40.82 kg/m      02/07/2024   11:06 AM 11/04/2023    1:13 PM 08/06/2023    1:41 PM  BP/Weight  Systolic BP 164 149 --  Diastolic BP 66 72 --  Wt. (Lbs) 209 207 207  BMI 40.82 kg/m2 40.43 kg/m2 40.43 kg/m2    Physical Exam Vitals and nursing note reviewed.  Constitutional:      General: She is not in acute distress.    Appearance: Normal appearance.   HENT:     Head: Normocephalic and atraumatic.  Eyes:     General:        Right eye: No discharge.        Left eye: No discharge.     Conjunctiva/sclera: Conjunctivae normal.  Cardiovascular:     Rate and Rhythm: Normal rate and regular rhythm.  Pulmonary:     Effort: Pulmonary effort is normal.     Breath sounds: Normal breath sounds. No wheezing, rhonchi or rales.  Skin:    Comments: Dorsum of the left foot with a raised lesion that looks quite friable. ?  Granulation tissue.  Left forearm with a hyperkeratotic lesion most consistent with cutaneous horn.  Right wrist with a palpable firm area consistent with a cyst.  Neurological:     Mental Status: She is alert.  Psychiatric:        Mood and Affect: Mood normal.        Behavior: Behavior normal.     Lab Results  Component Value Date   WBC 9.0 05/17/2023   HGB 14.6 05/17/2023   HCT 44.7 05/17/2023   PLT 319 05/17/2023   GLUCOSE 88 05/17/2023   CHOL 238 (H) 05/17/2023   TRIG 97 05/17/2023   HDL 80 05/17/2023   LDLCALC 141 (H) 05/17/2023   ALT 15 05/17/2023   AST 16 05/17/2023   NA 140 05/17/2023   K 4.3 05/17/2023   CL 100 05/17/2023   CREATININE 0.53 (L) 05/17/2023   BUN 9 05/17/2023   CO2 23 05/17/2023   TSH 1.780 06/27/2014   INR 0.9 10/27/2019   HGBA1C 6.3 (H) 05/17/2023     Assessment & Plan:  Chronic right-sided low back pain, unspecified whether sciatica present Assessment & Plan: Has completed PT.  Had no improvement.  Referring to neurosurgery.  Orders: -     Ambulatory referral to Neurosurgery  Essential hypertension Assessment & Plan: BP elevated here today.  If continues to be elevated will increase amlodipine .   Skin lesion Assessment & Plan: Patient has 2 concerning skin lesions.  Advised to reach out to dermatology as I believe both of these need to either be biopsied or excised.     Follow-up: 6 months  Gertrude Tarbet Bluford DO [facility]

## 2024-02-08 NOTE — Assessment & Plan Note (Signed)
 Has completed PT.  Had no improvement.  Referring to neurosurgery.

## 2024-02-08 NOTE — Assessment & Plan Note (Signed)
 Patient has 2 concerning skin lesions.  Advised to reach out to dermatology as I believe both of these need to either be biopsied or excised.

## 2024-02-08 NOTE — Assessment & Plan Note (Signed)
 BP elevated here today.  If continues to be elevated will increase amlodipine .

## 2024-02-14 DIAGNOSIS — L820 Inflamed seborrheic keratosis: Secondary | ICD-10-CM | POA: Diagnosis not present

## 2024-02-14 DIAGNOSIS — C44729 Squamous cell carcinoma of skin of left lower limb, including hip: Secondary | ICD-10-CM | POA: Diagnosis not present

## 2024-02-14 DIAGNOSIS — C44629 Squamous cell carcinoma of skin of left upper limb, including shoulder: Secondary | ICD-10-CM | POA: Diagnosis not present

## 2024-02-15 ENCOUNTER — Telehealth: Payer: Self-pay | Admitting: Family Medicine

## 2024-02-15 ENCOUNTER — Other Ambulatory Visit: Payer: Self-pay | Admitting: Family Medicine

## 2024-02-15 DIAGNOSIS — M545 Low back pain, unspecified: Secondary | ICD-10-CM

## 2024-02-15 DIAGNOSIS — G8929 Other chronic pain: Secondary | ICD-10-CM

## 2024-02-15 NOTE — Telephone Encounter (Unsigned)
 Copied from CRM #[id number]. Topic: Referral - Question >> Feb 07, 2024  2:35 PM Victoria B wrote: Reason for CRM: Patient saw [facility] for neurosurgery on her summary info,but I let her know I am showing the referral for [facility]

## 2024-03-07 DIAGNOSIS — M4317 Spondylolisthesis, lumbosacral region: Secondary | ICD-10-CM | POA: Diagnosis not present

## 2024-03-07 DIAGNOSIS — M47816 Spondylosis without myelopathy or radiculopathy, lumbar region: Secondary | ICD-10-CM | POA: Diagnosis not present

## 2024-03-07 DIAGNOSIS — Z6841 Body Mass Index (BMI) 40.0 and over, adult: Secondary | ICD-10-CM | POA: Diagnosis not present

## 2024-03-24 ENCOUNTER — Other Ambulatory Visit (HOSPITAL_COMMUNITY): Payer: Self-pay | Admitting: Neurosurgery

## 2024-03-24 DIAGNOSIS — M4316 Spondylolisthesis, lumbar region: Secondary | ICD-10-CM

## 2024-03-31 ENCOUNTER — Ambulatory Visit (HOSPITAL_COMMUNITY)
Admission: RE | Admit: 2024-03-31 | Discharge: 2024-03-31 | Disposition: A | Source: Ambulatory Visit | Attending: Neurosurgery | Admitting: Neurosurgery

## 2024-03-31 DIAGNOSIS — M4316 Spondylolisthesis, lumbar region: Secondary | ICD-10-CM | POA: Diagnosis present

## 2024-08-07 ENCOUNTER — Ambulatory Visit: Admitting: Family Medicine

## 2024-08-11 ENCOUNTER — Ambulatory Visit
